# Patient Record
Sex: Male | Born: 1960 | Race: White | Hispanic: No | Smoking: Former smoker
Health system: Southern US, Community
[De-identification: ages and names within clinical notes are randomized; demographics above are authoritative.]

## PROBLEM LIST (undated history)

## (undated) DIAGNOSIS — Z21 Asymptomatic human immunodeficiency virus [HIV] infection status: Secondary | ICD-10-CM

## (undated) DIAGNOSIS — A630 Anogenital (venereal) warts: Secondary | ICD-10-CM

## (undated) DIAGNOSIS — B20 Human immunodeficiency virus [HIV] disease: Secondary | ICD-10-CM

## (undated) DIAGNOSIS — C210 Malignant neoplasm of anus, unspecified: Principal | ICD-10-CM

## (undated) DIAGNOSIS — Z923 Personal history of irradiation: Secondary | ICD-10-CM

## (undated) DIAGNOSIS — J189 Pneumonia, unspecified organism: Secondary | ICD-10-CM

## (undated) DIAGNOSIS — IMO0002 Reserved for concepts with insufficient information to code with codable children: Secondary | ICD-10-CM

## (undated) DIAGNOSIS — T7840XA Allergy, unspecified, initial encounter: Secondary | ICD-10-CM

## (undated) DIAGNOSIS — C801 Malignant (primary) neoplasm, unspecified: Secondary | ICD-10-CM

## (undated) HISTORY — PX: COLONOSCOPY: SHX174

## (undated) HISTORY — DX: Allergy, unspecified, initial encounter: T78.40XA

## (undated) HISTORY — PX: NO PAST SURGERIES: SHX2092

## (undated) HISTORY — DX: Malignant neoplasm of anus, unspecified: C21.0

## (undated) HISTORY — DX: Anogenital (venereal) warts: A63.0

## (undated) HISTORY — DX: Malignant (primary) neoplasm, unspecified: C80.1

## (undated) HISTORY — DX: Asymptomatic human immunodeficiency virus (hiv) infection status: Z21

## (undated) HISTORY — DX: Human immunodeficiency virus (HIV) disease: B20

## (undated) HISTORY — DX: Personal history of irradiation: Z92.3

## (undated) HISTORY — DX: Reserved for concepts with insufficient information to code with codable children: IMO0002

---

## 1999-05-07 ENCOUNTER — Encounter: Payer: Self-pay | Admitting: Internal Medicine

## 1999-10-08 ENCOUNTER — Encounter: Admission: RE | Admit: 1999-10-08 | Discharge: 1999-10-08 | Payer: Self-pay | Admitting: Internal Medicine

## 1999-10-08 ENCOUNTER — Ambulatory Visit (HOSPITAL_COMMUNITY): Admission: RE | Admit: 1999-10-08 | Discharge: 1999-10-08 | Payer: Self-pay | Admitting: Internal Medicine

## 1999-10-08 ENCOUNTER — Encounter (INDEPENDENT_AMBULATORY_CARE_PROVIDER_SITE_OTHER): Payer: Self-pay | Admitting: *Deleted

## 1999-10-08 LAB — CONVERTED CEMR LAB: CD4 Count: 50 microliters

## 2000-02-10 ENCOUNTER — Ambulatory Visit (HOSPITAL_COMMUNITY): Admission: RE | Admit: 2000-02-10 | Discharge: 2000-02-10 | Payer: Self-pay | Admitting: Internal Medicine

## 2000-02-10 ENCOUNTER — Encounter: Admission: RE | Admit: 2000-02-10 | Discharge: 2000-02-10 | Payer: Self-pay | Admitting: Internal Medicine

## 2000-02-24 ENCOUNTER — Encounter: Admission: RE | Admit: 2000-02-24 | Discharge: 2000-02-24 | Payer: Self-pay | Admitting: Internal Medicine

## 2000-02-28 ENCOUNTER — Encounter: Payer: Self-pay | Admitting: Internal Medicine

## 2000-03-15 ENCOUNTER — Encounter: Admission: RE | Admit: 2000-03-15 | Discharge: 2000-03-15 | Payer: Self-pay | Admitting: Internal Medicine

## 2000-04-05 ENCOUNTER — Encounter: Admission: RE | Admit: 2000-04-05 | Discharge: 2000-04-05 | Payer: Self-pay | Admitting: Internal Medicine

## 2000-04-19 ENCOUNTER — Encounter: Admission: RE | Admit: 2000-04-19 | Discharge: 2000-04-19 | Payer: Self-pay | Admitting: Internal Medicine

## 2000-05-17 ENCOUNTER — Encounter: Admission: RE | Admit: 2000-05-17 | Discharge: 2000-05-17 | Payer: Self-pay | Admitting: Internal Medicine

## 2000-10-17 ENCOUNTER — Ambulatory Visit (HOSPITAL_COMMUNITY): Admission: RE | Admit: 2000-10-17 | Discharge: 2000-10-17 | Payer: Self-pay | Admitting: Internal Medicine

## 2000-10-17 ENCOUNTER — Encounter: Admission: RE | Admit: 2000-10-17 | Discharge: 2000-10-17 | Payer: Self-pay | Admitting: Internal Medicine

## 2001-12-12 ENCOUNTER — Ambulatory Visit (HOSPITAL_COMMUNITY): Admission: RE | Admit: 2001-12-12 | Discharge: 2001-12-12 | Payer: Self-pay | Admitting: Internal Medicine

## 2001-12-12 ENCOUNTER — Encounter: Admission: RE | Admit: 2001-12-12 | Discharge: 2001-12-12 | Payer: Self-pay | Admitting: Internal Medicine

## 2002-09-04 ENCOUNTER — Encounter: Payer: Self-pay | Admitting: Internal Medicine

## 2002-09-04 ENCOUNTER — Encounter: Admission: RE | Admit: 2002-09-04 | Discharge: 2002-09-04 | Payer: Self-pay | Admitting: Internal Medicine

## 2002-09-04 ENCOUNTER — Ambulatory Visit (HOSPITAL_COMMUNITY): Admission: RE | Admit: 2002-09-04 | Discharge: 2002-09-04 | Payer: Self-pay | Admitting: Internal Medicine

## 2002-09-18 ENCOUNTER — Encounter: Admission: RE | Admit: 2002-09-18 | Discharge: 2002-09-18 | Payer: Self-pay | Admitting: Internal Medicine

## 2002-10-18 ENCOUNTER — Encounter: Admission: RE | Admit: 2002-10-18 | Discharge: 2002-10-18 | Payer: Self-pay | Admitting: Internal Medicine

## 2002-11-27 ENCOUNTER — Encounter: Admission: RE | Admit: 2002-11-27 | Discharge: 2002-11-27 | Payer: Self-pay | Admitting: Internal Medicine

## 2002-11-27 ENCOUNTER — Encounter: Payer: Self-pay | Admitting: Internal Medicine

## 2002-11-27 ENCOUNTER — Ambulatory Visit (HOSPITAL_COMMUNITY): Admission: RE | Admit: 2002-11-27 | Discharge: 2002-11-27 | Payer: Self-pay | Admitting: Internal Medicine

## 2004-02-05 ENCOUNTER — Ambulatory Visit (HOSPITAL_COMMUNITY): Admission: RE | Admit: 2004-02-05 | Discharge: 2004-02-05 | Payer: Self-pay | Admitting: Internal Medicine

## 2004-02-05 ENCOUNTER — Ambulatory Visit: Payer: Self-pay | Admitting: Internal Medicine

## 2004-02-18 ENCOUNTER — Ambulatory Visit: Payer: Self-pay | Admitting: Internal Medicine

## 2004-06-17 ENCOUNTER — Ambulatory Visit: Payer: Self-pay | Admitting: Internal Medicine

## 2004-06-17 ENCOUNTER — Ambulatory Visit (HOSPITAL_COMMUNITY): Admission: RE | Admit: 2004-06-17 | Discharge: 2004-06-17 | Payer: Self-pay | Admitting: Internal Medicine

## 2004-07-07 ENCOUNTER — Ambulatory Visit: Payer: Self-pay | Admitting: Internal Medicine

## 2005-01-06 ENCOUNTER — Ambulatory Visit: Payer: Self-pay | Admitting: Internal Medicine

## 2005-01-06 ENCOUNTER — Encounter (INDEPENDENT_AMBULATORY_CARE_PROVIDER_SITE_OTHER): Payer: Self-pay | Admitting: *Deleted

## 2005-01-06 ENCOUNTER — Ambulatory Visit (HOSPITAL_COMMUNITY): Admission: RE | Admit: 2005-01-06 | Discharge: 2005-01-06 | Payer: Self-pay | Admitting: Internal Medicine

## 2005-01-06 LAB — CONVERTED CEMR LAB: CD4 Count: 20 microliters

## 2005-09-21 ENCOUNTER — Encounter: Admission: RE | Admit: 2005-09-21 | Discharge: 2005-09-21 | Payer: Self-pay | Admitting: Internal Medicine

## 2005-09-21 ENCOUNTER — Encounter (INDEPENDENT_AMBULATORY_CARE_PROVIDER_SITE_OTHER): Payer: Self-pay | Admitting: *Deleted

## 2005-09-21 ENCOUNTER — Ambulatory Visit: Payer: Self-pay | Admitting: Internal Medicine

## 2005-09-21 LAB — CONVERTED CEMR LAB: CD4 Count: 9 microliters

## 2005-10-05 ENCOUNTER — Ambulatory Visit: Payer: Self-pay | Admitting: Internal Medicine

## 2005-10-05 ENCOUNTER — Encounter (INDEPENDENT_AMBULATORY_CARE_PROVIDER_SITE_OTHER): Payer: Self-pay | Admitting: *Deleted

## 2005-10-05 LAB — CONVERTED CEMR LAB: HIV 1 RNA Quant: 2500000 copies/mL

## 2005-10-18 ENCOUNTER — Encounter: Payer: Self-pay | Admitting: Internal Medicine

## 2005-10-26 ENCOUNTER — Ambulatory Visit: Payer: Self-pay | Admitting: Internal Medicine

## 2005-12-09 ENCOUNTER — Ambulatory Visit: Payer: Self-pay | Admitting: Internal Medicine

## 2005-12-09 ENCOUNTER — Encounter: Admission: RE | Admit: 2005-12-09 | Discharge: 2005-12-09 | Payer: Self-pay | Admitting: Internal Medicine

## 2005-12-09 ENCOUNTER — Encounter (INDEPENDENT_AMBULATORY_CARE_PROVIDER_SITE_OTHER): Payer: Self-pay | Admitting: *Deleted

## 2005-12-09 LAB — CONVERTED CEMR LAB
ALT: 33 units/L (ref 0–53)
Alkaline Phosphatase: 57 units/L (ref 39–117)
BUN: 15 mg/dL (ref 6–23)
CD4 Count: 10 microliters
Calcium: 8.8 mg/dL (ref 8.4–10.5)
Chloride: 104 meq/L (ref 96–112)
Creatinine, Ser: 0.94 mg/dL (ref 0.40–1.50)
Eosinophils Absolute: 0 cells/mcL (ref 0.0–0.3)
Eosinophils Relative: 1 % (ref 0–4)
Glucose, Bld: 115 mg/dL — ABNORMAL HIGH (ref 70–99)
HCT: 37.2 % — ABNORMAL LOW (ref 41.0–49.0)
Hemoglobin: 12.7 g/dL — ABNORMAL LOW (ref 13.9–16.8)
Leukocyte count, blood: 3.5 10*9/L — ABNORMAL LOW (ref 3.7–10.0)
Lymphocytes Relative: 51 % — ABNORMAL HIGH (ref 15–43)
MCV: 114.1 fL — ABNORMAL HIGH (ref 78.8–100.0)
Monocytes Absolute: 0.5 10*3/uL (ref 0.2–0.7)
Potassium: 4.2 meq/L (ref 3.5–5.3)
RBC: 3.26 M/uL — ABNORMAL LOW (ref 4.20–5.50)

## 2005-12-28 ENCOUNTER — Ambulatory Visit: Payer: Self-pay | Admitting: Internal Medicine

## 2006-01-26 ENCOUNTER — Emergency Department (HOSPITAL_COMMUNITY): Admission: EM | Admit: 2006-01-26 | Discharge: 2006-01-26 | Payer: Self-pay | Admitting: Emergency Medicine

## 2006-03-28 ENCOUNTER — Encounter (INDEPENDENT_AMBULATORY_CARE_PROVIDER_SITE_OTHER): Payer: Self-pay | Admitting: *Deleted

## 2006-03-28 LAB — CONVERTED CEMR LAB

## 2006-04-10 ENCOUNTER — Encounter (INDEPENDENT_AMBULATORY_CARE_PROVIDER_SITE_OTHER): Payer: Self-pay | Admitting: *Deleted

## 2006-05-30 ENCOUNTER — Telehealth: Payer: Self-pay | Admitting: Internal Medicine

## 2007-03-16 ENCOUNTER — Telehealth: Payer: Self-pay | Admitting: Internal Medicine

## 2007-05-25 ENCOUNTER — Telehealth: Payer: Self-pay | Admitting: Internal Medicine

## 2007-05-30 ENCOUNTER — Encounter: Payer: Self-pay | Admitting: Internal Medicine

## 2007-05-30 ENCOUNTER — Encounter (INDEPENDENT_AMBULATORY_CARE_PROVIDER_SITE_OTHER): Payer: Self-pay | Admitting: *Deleted

## 2007-05-30 DIAGNOSIS — B20 Human immunodeficiency virus [HIV] disease: Secondary | ICD-10-CM

## 2007-07-10 ENCOUNTER — Encounter: Admission: RE | Admit: 2007-07-10 | Discharge: 2007-07-10 | Payer: Self-pay | Admitting: Internal Medicine

## 2007-07-10 ENCOUNTER — Ambulatory Visit: Payer: Self-pay | Admitting: Internal Medicine

## 2007-07-10 LAB — CONVERTED CEMR LAB
ALT: 39 units/L (ref 0–53)
AST: 28 units/L (ref 0–37)
Albumin: 4.5 g/dL (ref 3.5–5.2)
Basophils Relative: 0 % (ref 0–1)
CO2: 25 meq/L (ref 19–32)
Calcium: 9.6 mg/dL (ref 8.4–10.5)
Chlamydia, Swab/Urine, PCR: NEGATIVE
Chloride: 105 meq/L (ref 96–112)
Eosinophils Absolute: 0 10*3/uL (ref 0.0–0.7)
Eosinophils Relative: 0 % (ref 0–5)
GC Probe Amp, Urine: NEGATIVE
HIV 1 RNA Quant: 22300 copies/mL — ABNORMAL HIGH (ref <50)
Ketones, ur: NEGATIVE mg/dL
LDL Cholesterol: 89 mg/dL (ref 0–99)
Lymphocytes Relative: 22 % (ref 12–46)
Lymphs Abs: 0.9 10*3/uL (ref 0.7–4.0)
MCHC: 34 g/dL (ref 30.0–36.0)
MCV: 103.6 fL — ABNORMAL HIGH (ref 78.0–100.0)
Neutro Abs: 2.6 10*3/uL (ref 1.7–7.7)
Potassium: 4.7 meq/L (ref 3.5–5.3)
Protein, ur: NEGATIVE mg/dL
RBC: 3.86 M/uL — ABNORMAL LOW (ref 4.22–5.81)
Sodium: 141 meq/L (ref 135–145)
Total Bilirubin: 0.8 mg/dL (ref 0.3–1.2)
Urine Glucose: NEGATIVE mg/dL
Urobilinogen, UA: 0.2 (ref 0.0–1.0)
VLDL: 13 mg/dL (ref 0–40)
pH: 6.5 (ref 5.0–8.0)

## 2007-07-25 ENCOUNTER — Ambulatory Visit: Payer: Self-pay | Admitting: Internal Medicine

## 2007-07-31 ENCOUNTER — Encounter: Payer: Self-pay | Admitting: Internal Medicine

## 2007-07-31 ENCOUNTER — Telehealth: Payer: Self-pay | Admitting: Internal Medicine

## 2007-10-05 ENCOUNTER — Ambulatory Visit: Payer: Self-pay | Admitting: Internal Medicine

## 2007-10-05 DIAGNOSIS — IMO0002 Reserved for concepts with insufficient information to code with codable children: Secondary | ICD-10-CM

## 2007-10-05 DIAGNOSIS — M5416 Radiculopathy, lumbar region: Secondary | ICD-10-CM | POA: Insufficient documentation

## 2007-10-16 ENCOUNTER — Telehealth: Payer: Self-pay | Admitting: Internal Medicine

## 2007-10-25 ENCOUNTER — Encounter: Payer: Self-pay | Admitting: Internal Medicine

## 2007-10-25 ENCOUNTER — Ambulatory Visit: Payer: Self-pay | Admitting: Internal Medicine

## 2007-10-26 ENCOUNTER — Ambulatory Visit (HOSPITAL_COMMUNITY): Admission: RE | Admit: 2007-10-26 | Discharge: 2007-10-26 | Payer: Self-pay | Admitting: Internal Medicine

## 2007-10-30 ENCOUNTER — Telehealth (INDEPENDENT_AMBULATORY_CARE_PROVIDER_SITE_OTHER): Payer: Self-pay | Admitting: *Deleted

## 2007-10-31 ENCOUNTER — Encounter: Admission: RE | Admit: 2007-10-31 | Discharge: 2008-01-03 | Payer: Self-pay | Admitting: Internal Medicine

## 2007-11-03 ENCOUNTER — Encounter: Payer: Self-pay | Admitting: Internal Medicine

## 2007-11-29 ENCOUNTER — Encounter: Payer: Self-pay | Admitting: Internal Medicine

## 2007-12-06 ENCOUNTER — Ambulatory Visit: Payer: Self-pay | Admitting: Internal Medicine

## 2007-12-06 LAB — CONVERTED CEMR LAB
Alkaline Phosphatase: 81 units/L (ref 39–117)
BUN: 20 mg/dL (ref 6–23)
Basophils Absolute: 0 10*3/uL (ref 0.0–0.1)
CO2: 26 meq/L (ref 19–32)
Chloride: 106 meq/L (ref 96–112)
Glucose, Bld: 95 mg/dL (ref 70–99)
HIV 1 RNA Quant: >1000000 copies/mL — ABNORMAL HIGH (ref <48)
Lymphocytes Relative: 23 % (ref 12–46)
Lymphs Abs: 0.5 10*3/uL — ABNORMAL LOW (ref 0.7–4.0)
MCV: 104.3 fL — ABNORMAL HIGH (ref 78.0–100.0)
Monocytes Absolute: 0.6 10*3/uL (ref 0.1–1.0)
Neutrophils Relative %: 48 % (ref 43–77)
Platelets: 101 10*3/uL — ABNORMAL LOW (ref 150–400)
RDW: 15.2 % (ref 11.5–15.5)
Sodium: 141 meq/L (ref 135–145)
Total Bilirubin: 0.6 mg/dL (ref 0.3–1.2)

## 2007-12-26 ENCOUNTER — Ambulatory Visit: Payer: Self-pay | Admitting: Internal Medicine

## 2007-12-26 DIAGNOSIS — Z8739 Personal history of other diseases of the musculoskeletal system and connective tissue: Secondary | ICD-10-CM | POA: Insufficient documentation

## 2007-12-26 DIAGNOSIS — IMO0002 Reserved for concepts with insufficient information to code with codable children: Secondary | ICD-10-CM

## 2008-02-06 ENCOUNTER — Ambulatory Visit: Payer: Self-pay | Admitting: Internal Medicine

## 2008-02-06 LAB — CONVERTED CEMR LAB
Albumin: 4.2 g/dL (ref 3.5–5.2)
BUN: 18 mg/dL (ref 6–23)
Basophils Absolute: 0 10*3/uL (ref 0.0–0.1)
Chloride: 105 meq/L (ref 96–112)
Cholesterol: 167 mg/dL (ref 0–200)
Eosinophils Relative: 1 % (ref 0–5)
Glucose, Bld: 99 mg/dL (ref 70–99)
HIV 1 RNA Quant: 192 copies/mL — ABNORMAL HIGH (ref <48)
HIV-1 RNA Quant, Log: 2.28 — ABNORMAL HIGH (ref <1.68)
Hemoglobin: 12.2 g/dL — ABNORMAL LOW (ref 13.0–17.0)
LDL Cholesterol: 105 mg/dL — ABNORMAL HIGH (ref 0–99)
Lymphs Abs: 2.2 10*3/uL (ref 0.7–4.0)
MCV: 115.2 fL — ABNORMAL HIGH (ref 78.0–100.0)
Monocytes Absolute: 0.7 10*3/uL (ref 0.1–1.0)
Neutrophils Relative %: 31 % — ABNORMAL LOW (ref 43–77)
Platelets: 217 10*3/uL (ref 150–400)
Potassium: 4.7 meq/L (ref 3.5–5.3)
RBC: 3.02 M/uL — ABNORMAL LOW (ref 4.22–5.81)
Triglycerides: 73 mg/dL (ref <150)
VLDL: 15 mg/dL (ref 0–40)
WBC: 4.3 10*3/uL (ref 4.0–10.5)

## 2008-03-05 ENCOUNTER — Ambulatory Visit: Payer: Self-pay | Admitting: Internal Medicine

## 2008-09-25 ENCOUNTER — Ambulatory Visit: Payer: Self-pay | Admitting: Internal Medicine

## 2008-09-25 LAB — CONVERTED CEMR LAB
HIV 1 RNA Quant: 170 copies/mL — ABNORMAL HIGH (ref <48)
HIV-1 RNA Quant, Log: 2.23 — ABNORMAL HIGH (ref <1.68)

## 2008-10-22 ENCOUNTER — Ambulatory Visit: Payer: Self-pay | Admitting: Internal Medicine

## 2008-10-22 DIAGNOSIS — K8010 Calculus of gallbladder with chronic cholecystitis without obstruction: Secondary | ICD-10-CM

## 2009-03-28 ENCOUNTER — Encounter: Payer: Self-pay | Admitting: Internal Medicine

## 2009-03-31 ENCOUNTER — Encounter (INDEPENDENT_AMBULATORY_CARE_PROVIDER_SITE_OTHER): Payer: Self-pay | Admitting: *Deleted

## 2009-09-19 ENCOUNTER — Encounter (INDEPENDENT_AMBULATORY_CARE_PROVIDER_SITE_OTHER): Payer: Self-pay | Admitting: *Deleted

## 2009-10-07 ENCOUNTER — Ambulatory Visit: Payer: Self-pay | Admitting: Internal Medicine

## 2009-10-07 LAB — CONVERTED CEMR LAB: HIV 1 RNA Quant: 130000 copies/mL — ABNORMAL HIGH (ref <48)

## 2009-10-14 ENCOUNTER — Encounter: Payer: Self-pay | Admitting: Internal Medicine

## 2009-10-21 ENCOUNTER — Ambulatory Visit: Payer: Self-pay | Admitting: Internal Medicine

## 2009-11-06 ENCOUNTER — Telehealth (INDEPENDENT_AMBULATORY_CARE_PROVIDER_SITE_OTHER): Payer: Self-pay | Admitting: *Deleted

## 2009-11-07 ENCOUNTER — Encounter: Payer: Self-pay | Admitting: Internal Medicine

## 2009-11-14 ENCOUNTER — Encounter: Payer: Self-pay | Admitting: Internal Medicine

## 2009-11-27 ENCOUNTER — Ambulatory Visit: Payer: Self-pay | Admitting: Internal Medicine

## 2009-11-27 DIAGNOSIS — Z87891 Personal history of nicotine dependence: Secondary | ICD-10-CM | POA: Insufficient documentation

## 2009-11-27 DIAGNOSIS — J209 Acute bronchitis, unspecified: Secondary | ICD-10-CM | POA: Insufficient documentation

## 2009-12-03 ENCOUNTER — Encounter: Payer: Self-pay | Admitting: Internal Medicine

## 2009-12-16 ENCOUNTER — Encounter (INDEPENDENT_AMBULATORY_CARE_PROVIDER_SITE_OTHER): Payer: Self-pay | Admitting: *Deleted

## 2010-01-12 ENCOUNTER — Ambulatory Visit: Payer: Self-pay | Admitting: Internal Medicine

## 2010-01-12 ENCOUNTER — Encounter: Payer: Self-pay | Admitting: Internal Medicine

## 2010-01-12 LAB — CONVERTED CEMR LAB
ALT: 28 units/L (ref 0–53)
AST: 23 units/L (ref 0–37)
Albumin: 4.5 g/dL (ref 3.5–5.2)
Calcium: 9.2 mg/dL (ref 8.4–10.5)
Eosinophils Relative: 2 % (ref 0–5)
HCT: 38.8 % — ABNORMAL LOW (ref 39.0–52.0)
HIV-1 RNA Quant, Log: 2.66 — ABNORMAL HIGH (ref <1.30)
MCV: 102.6 fL — ABNORMAL HIGH (ref 78.0–100.0)
Monocytes Relative: 13 % — ABNORMAL HIGH (ref 3–12)
Neutro Abs: 1.5 10*3/uL — ABNORMAL LOW (ref 1.7–7.7)
Neutrophils Relative %: 39 % — ABNORMAL LOW (ref 43–77)
Platelets: 192 10*3/uL (ref 150–400)
RBC: 3.78 M/uL — ABNORMAL LOW (ref 4.22–5.81)
Sodium: 140 meq/L (ref 135–145)
Total Protein: 7.3 g/dL (ref 6.0–8.3)
WBC: 3.9 10*3/uL — ABNORMAL LOW (ref 4.0–10.5)

## 2010-02-03 ENCOUNTER — Ambulatory Visit
Admission: RE | Admit: 2010-02-03 | Discharge: 2010-02-03 | Payer: Self-pay | Source: Home / Self Care | Attending: Internal Medicine | Admitting: Internal Medicine

## 2010-03-03 NOTE — Miscellaneous (Signed)
Summary: RW Update  Clinical Lists Changes  Observations: Added new observation of PAYOR: Private (11/07/2009 10:57)

## 2010-03-03 NOTE — Miscellaneous (Signed)
Summary: clinical update/ryan white ncadap app completed  Clinical Lists Changes  Observations: Added new observation of AIDSDAP: Pending (09/19/2009 11:06) Added new observation of RWTITLE: B (09/19/2009 11:06) Added new observation of PAYOR: No Insurance (09/19/2009 11:06) Added new observation of PCTFPL: 227.45  (09/19/2009 11:06) Added new observation of INCOMESOURCE: unemployment  (09/19/2009 11:06) Added new observation of HOUSEINCOME: 81191  (09/19/2009 11:06) Added new observation of FINASSESSDT: 09/19/2009  (09/19/2009 11:06) Added new observation of YEARLYEXPEN: 2150  (09/19/2009 11:06) Added new observation of RW VITAL STA: Active  (09/19/2009 11:06)     Paulo Fruit  BS,CPht II,MPH  September 19, 2009 11:07 AM

## 2010-03-03 NOTE — Assessment & Plan Note (Signed)
Summary: F/U [MKJ]   CC:  follow-up visit.  History of Present Illness:   Matthew Cortez is in for his routine appointment. He was able to afford dapsone, fluconazole and azithromycin and has been taking all 3 since his last visit.  He tells me that his APAP was recently approved.  He still is looking for work.  He recently developed a moderately productive cough.  His partner has been sick with a similar illness.  He has not had any fever or shortness of breath.  He continues to smoke 4 to 5 cigarettes daily but does want to quit.  Preventive Screening-Counseling & Management  Alcohol-Tobacco     Alcohol type: wine, 4 times a week     Smoking Status: current     Smoking Cessation Counseling: yes     Packs/Day: 4 cigs     Passive Smoke Exposure: no  Caffeine-Diet-Exercise     Caffeine use/day: 4     Does Patient Exercise: yes     Type of exercise: dog walking, twice a day     Times/week: 7  Hep-HIV-STD-Contraception     HIV Risk: no risk noted     HIV Risk Counseling: not indicated-no HIV risk noted  Safety-Violence-Falls     Seat Belt Use: yes  Comments: declined condoms      Sexual History:  currently monogamous.        Drug Use:  never.     Prior Medication List:  DAPSONE 100 MG TABS (DAPSONE) Take 1 tablet by mouth once a day FLUCONAZOLE 100 MG TABS (FLUCONAZOLE) Take 1 tablet by mouth once a week AZITHROMYCIN 600 MG TABS (AZITHROMYCIN) Take 2 tablets by mouth once a week   Current Allergies (reviewed today): ! SULFA ! [facility] ! AMOXICILLIN (AMOXICILLIN) Vital Signs:  Patient profile:   50 year old male Height:      73 inches (185.42 cm) Weight:      218.5 pounds (99.32 kg) BMI:     28.93 Temp:     98.1 degrees F (36.72 degrees C) oral Pulse rate:   61 / minute BP sitting:   127 / 85  (left arm) Cuff size:   large  Vitals Entered By: Jennet Maduro RN (November 27, 2009 9:23 AM) CC: follow-up visit Is Patient Diabetic? No Pain Assessment Patient in  pain? no      Nutritional Status BMI of 25 - 29 = overweight Nutritional Status Detail appetite "good"  Have you ever been in a relationship where you felt threatened, hurt or afraid?No   Does patient need assistance? Functional Status Self care Ambulation Normal Comments dapsone, azithromycin and fluconazole now   Physical Exam  General:  alert and well-nourished.  alert and well-nourished.   Mouth:  good dentition, pharynx pink and moist, no erythema, and no exudates.   Lungs:  normal breath sounds, no crackles, and no wheezes.   Heart:  normal rate, regular rhythm, and no murmur.   Skin:  no rashes.  no rashes.   Axillary Nodes:  no R axillary adenopathy and no L axillary adenopathy.  no R axillary adenopathy and no L axillary adenopathy.   Psych:  normally interactive, good eye contact, not anxious appearing, and not depressed appearing.  normally interactive, good eye contact, not anxious appearing, and not depressed appearing.          Medication Adherence: 11/27/2009   Adherence to medications reviewed with patient. Counseling to provide adequate adherence provided  Impression & Recommendations:  Problem # 1:  HIV INFECTION (ICD-042) Savion is ready to restart anti-retroviral therapy.  I reviewed his multiple past regimens.  He has been on Combivir, Viread,  Videx, Trizivir, Zerit, Viramune, Sustiva, Kaletra, Prezista and Norvir. 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  He has also had an intolerance with many regimens usually with nausea poor sleepiness.  He has had 5 prior genotype resistance tests. 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  I have recommended that he start Truvada, Retrovir, Reyataz and Norvir. His updated medication list for this problem includes:    Fluconazole 100 Mg Tabs (Fluconazole) .Marland Kitchen... Take 1 tablet by mouth once a week    Azithromycin 600 Mg  Tabs (Azithromycin) .Marland Kitchen... 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 Patient Level IV (99214)Future Orders: T-CD4SP ([facility]) (CD4SP) ... 01/08/2010 T-HIV Viral Load [id number]) ... 01/08/2010 T-Comprehensive Metabolic Panel [id number]) ... 01/08/2010 T-CBC w/Diff ([id number]) ... 01/08/2010 T-RPR (Syphilis) [phone]) ... 01/08/2010  Problem # 2:  BRONCHITIS, ACUTE (ICD-466.0) He has acute bronchitis related to his cigarette use and probably an upper respiratory infection.  I had a discussion with him about the importance of quitting smoking.  I've given him written information about local resources for cigarette cessation counseling. His updated medication list for this problem includes:    Azithromycin 600 Mg Tabs (Azithromycin) .Marland Kitchen... Take 2 tablets by mouth once a week  Orders: Est. Patient Level IV (34742)  Medications Added to Medication List This Visit: 1)  Truvada 200-300 Mg Tabs (Emtricitabine-tenofovir) .... Take 1 tablet by mouth once a day 2)  Retrovir 300 Mg Tabs (Zidovudine) .... Take 1 tablet by mouth twice a day 3)  Reyataz 300 Mg Caps (Atazanavir sulfate) .... Take 1 capsule by mouth once a day 4)  Norvir 100 Mg Tabs (Ritonavir) .... Take 1 tablet by mouth once a day  Patient Instructions: 1)  Please schedule a follow-up appointment in 2 months.

## 2010-03-03 NOTE — Assessment & Plan Note (Signed)
Summary: F/U OV/VS   CC:  follow-up visit, lower back still bothering him, and left lower leg still numb.  History of Present Illness:  Matthew Cortez is in for his first visit in one year.  Unfortunately, he lost his job again in February and therefore lost his health insurance.  He has been off all of his medications since early March.  He is still looking for work.  His partner of 11 years also lost his job and both are getting small unemployment checks.  They do use condoms.  Fortunately, he is feeling well and has actually gained weight since he is no longer working.  Preventive Screening-Counseling & Management  Alcohol-Tobacco     Alcohol type: wine, 4 times a week     Smoking Status: current     Smoking Cessation Counseling: yes     Packs/Day: 4 cigs     Passive Smoke Exposure: no  Caffeine-Diet-Exercise     Caffeine use/day: 4     Does Patient Exercise: yes     Type of exercise: dog walking, twice a day     Times/week: 7  Hep-HIV-STD-Contraception     HIV Risk: no risk noted     HIV Risk Counseling: not indicated-no HIV risk noted  Safety-Violence-Falls     Seat Belt Use: yes  Comments: declined condoms      Sexual History:  currently monogamous.        Drug Use:  never.     Updated Prior Medication List: No Medications Current Allergies (reviewed today): ! SULFA ! [facility] ! AMOXICILLIN (AMOXICILLIN) Social History: Sexual History:  currently monogamous Drug Use:  never  Vital Signs:  Patient profile:   50 year old male Height:      73 inches (185.42 cm) Weight:      219 pounds (99.55 kg) BMI:     29.00 Temp:     98.0 degrees F (36.67 degrees C) oral Pulse rate:   63 / minute BP sitting:   125 / 80  (left arm) Cuff size:   large  Vitals Entered By: Jennet Maduro RN (October 21, 2009 9:07 AM) CC: follow-up visit, lower back still bothering him, left lower leg still numb Is Patient Diabetic? No Pain Assessment Patient in pain? no       Nutritional Status BMI of 25 - 29 = overweight Nutritional Status Detail appetite  "good"  Have you ever been in a relationship where you felt threatened, hurt or afraid?No   Does patient need assistance? Functional Status Self care Ambulation Normal Comments missed some doses of rxes due to being laid off in February., Did get with By[facility] Hesselbach to start ADAP.  Had to wait 6 months due to previous income.  Now on unemployment.  Brought in letter from [facility] about  medications.  Encouraged to take letter to [facility] today   Physical Exam  General:  alert and overweight-appearing.   Mouth:  good dentition, pharynx pink and moist, no erythema, and no exudates.   Lungs:  normal breath sounds, no crackles, and no wheezes.   Heart:  normal rate, regular rhythm, and no murmur.   Skin:  no rashes.   Psych:  normally interactive, good eye contact, not anxious appearing, and not depressed appearing.          Medication Adherence: 10/21/2009   Adherence to medications reviewed with patient. Counseling to provide adequate adherence provided   Prevention For Positives: 10/21/2009   Safe sex practices discussed with patient. Condoms  offered.                             Impression & Recommendations:  Problem # 1:  HIV INFECTION (ICD-042) I will try to help Matthew Cortez restart his prophylactic medications while awaiting for ADAP approval.  His recent genotype showed the 184V mutation.  I will review his past regimens and other genotypes and then select a new anti-retroviral regimen. The following medications were removed from the medication list:    Fluconazole 100 Mg Tabs (Fluconazole) .Marland Kitchen... Take 1 tablet by mouth once a week His updated medication list for this problem includes:    Fluconazole 100 Mg Tabs (Fluconazole) .Marland Kitchen... Take 1 tablet by mouth once a week    Azithromycin 600 Mg Tabs (Azithromycin) .Marland Kitchen... Take 2 tablets by mouth once a week  Diagnostics Reviewed:  HIV: CDC-defined AIDS  (07/25/2007)   CD4: 30 (10/08/2009)   WBC: 4.3 (02/06/2008)   Hgb: 12.2 (02/06/2008)   HCT: 34.8 (02/06/2008)   Platelets: 217 (02/06/2008) HIV genotype: See Comment (10/14/2009)   HIV-1 RNA: 130000 (10/07/2009)   HBSAg: No (03/28/2006)  Orders: Est. Patient Level III (04540)  Medications Added to Medication List This Visit: 1)  Dapsone 100 Mg Tabs (Dapsone) .... Take 1 tablet by mouth once a day 2)  Fluconazole 100 Mg Tabs (Fluconazole) .... Take 1 tablet by mouth once a week 3)  Azithromycin 600 Mg Tabs (Azithromycin) .... Take 2 tablets by mouth once a week  Patient Instructions: 1)  Please schedule a follow-up appointment in 1 month.   Prescriptions: AZITHROMYCIN 600 MG TABS (AZITHROMYCIN) Take 2 tablets by mouth once a week  #10 x 11   Entered and Authorized by:   Cliffton Asters MD   Signed by:   Cliffton Asters MD on 10/21/2009   Method used:   Print then Give to Patient   RxID:   [id number] FLUCONAZOLE 100 MG TABS (FLUCONAZOLE) Take 1 tablet by mouth once a week  #5 x 11   Entered and Authorized by:   Cliffton Asters MD   Signed by:   Cliffton Asters MD on 10/21/2009   Method used:   Print then Give to Patient   RxID:   [id number] DAPSONE 100 MG TABS (DAPSONE) Take 1 tablet by mouth once a day  #30 x 11   Entered and Authorized by:   Cliffton Asters MD   Signed by:   Cliffton Asters MD on 10/21/2009   Method used:   Print then Give to Patient   RxID:   [id number]   Appended Document: F/U OV + flu vaccine   Influenza Vaccine    Vaccine Type: Fluvax Non-MCR    Site: left deltoid    Mfr: novartis    Dose: 0.5 ml    Route: IM    Given by: Jennet Maduro RN    Exp. Date: 05/03/2010    Lot #: [id number]  Flu Vaccine Consent Questions    Do you have a history of severe allergic reactions to this vaccine? no    Any prior history of allergic reactions to egg and/or gelatin? no    Do you have a sensitivity to the preservative Thimersol? no    Do you have a past  history of Guillan-Barre Syndrome? no    Do you currently have an acute febrile illness? no    Have you ever had a severe reaction to latex? no    Vaccine information given  and explained to patient? yes

## 2010-03-03 NOTE — Miscellaneous (Signed)
Summary: Flu Vac 03/28/09  Clinical Lists Changes  Observations: Added new observation of FLU VAX: Historical (03/28/2009 14:55)      Influenza Immunization History:    Influenza # 1:  Historical (03/28/2009)

## 2010-03-03 NOTE — Progress Notes (Signed)
Summary: [employer]   Imported By: Lester Bellview 06/06/2009 10:36:25  _____________________________________________________________________  External Attachment:    Type:   Image     Comment:   External Document

## 2010-03-03 NOTE — Miscellaneous (Signed)
Summary: Matthew Cortez : Notification Of Vacc  Matthew Cortez : Notification Of Vacc   Imported By: Florinda Marker 04/03/2009 16:44:47  _____________________________________________________________________  External Attachment:    Type:   Image     Comment:   External Document

## 2010-03-03 NOTE — Miscellaneous (Signed)
Summary: call Ronn Melena to verify elig  Clinical Lists Changes found app to [facility] for trizivir & reyataz dated 10/08/09. will call the company to verify elig &  how long it is good for.Golden Circle RN  November 14, 2009 8:07 AM

## 2010-03-03 NOTE — Miscellaneous (Signed)
  Clinical Lists Changes  Observations: Added new observation of YEARAIDSPOS: 2006  (12/16/2009 16:18)

## 2010-03-03 NOTE — Progress Notes (Signed)
Summary: Patient Assistance - Diflucan & Azithromycin  Phone Note Other Incoming   Summary of Call: Recieved patient's Diflucan and Azithromycin from patient assistance.  Left message for pateint to contact clinic on voicemail. Initial call taken by: Altamease Oiler,  November 06, 2009 8:23 AM

## 2010-03-03 NOTE — Assessment & Plan Note (Signed)
   Allergies: 1)  ! Sulfa 2)  ! * Viramune 3)  ! Amoxicillin (Amoxicillin)   Impression & Recommendations:  Problem # 1:  HIV INFECTION (ICD-042)  His updated medication list for this problem includes:    Fluconazole 100 Mg Tabs (Fluconazole) .Marland Kitchen... Take 1 tablet by mouth once a week  Orders: T-HIV Genotype ([id number])

## 2010-03-05 NOTE — Assessment & Plan Note (Signed)
Summary: F/U/VS   CC:  follow-up visit.  History of Present Illness: Matthew Cortez is in for his routine visit.  He started his new HIV regimen in late September.  He decided to stop drinking coffee and found that he tolerated the medicines much better than previous regimens.  He only has minimal nausea and no diarrhea.  He has not missed a single dose of his medications since starting them.  He is feeling much better.  He is still looking for work and trying to quit smoking cigarettes.  He is down to about 4 or each day.  He has been reading the information I gave him on cigarette cessation and finds it helpful.  He had a good holiday.  His son, daughter-in-law, in 59-year-old granddaughter visited for about 3 weeks.  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  This was the first time meeting his granddaughter.  Preventive Screening-Counseling & Management  Alcohol-Tobacco     Alcohol type: wine, 4 times a week     Smoking Status: current     Smoking Cessation Counseling: yes     Packs/Day: 4 cigs     Passive Smoke Exposure: no  Caffeine-Diet-Exercise     Caffeine use/day: 4     Does Patient Exercise: yes     Type of exercise: dog walking, twice a day     Times/week: 7  Hep-HIV-STD-Contraception     HIV Risk: no risk noted     HIV Risk Counseling: not indicated-no HIV risk noted  Safety-Violence-Falls     Seat Belt Use: yes  Comments: declined condoms      Sexual History:  currently monogamous.        Drug Use:  never.     Prior Medication List:  TRUVADA 200-300 MG TABS (EMTRICITABINE-TENOFOVIR) Take 1 tablet by mouth once a day RETROVIR 300 MG TABS (ZIDOVUDINE) Take 1 tablet by mouth twice a day REYATAZ 300 MG CAPS (ATAZANAVIR SULFATE) Take 1 capsule by mouth once a day NORVIR 100 MG TABS (RITONAVIR) Take 1 tablet by mouth once a day DAPSONE 100 MG TABS (DAPSONE) Take 1 tablet by mouth once a  day FLUCONAZOLE 100 MG TABS (FLUCONAZOLE) Take 1 tablet by mouth once a week AZITHROMYCIN 600 MG TABS (AZITHROMYCIN) Take 2 tablets by mouth once a week   Current Allergies (reviewed today): ! SULFA ! [facility] ! AMOXICILLIN (AMOXICILLIN) Vital Signs:  Patient profile:   50 year old male Height:      73 inches (185.42 cm) Weight:      219.5 pounds (99.77 kg) BMI:     29.06 Temp:     97.6 degrees F (36.44 degrees C) oral Pulse rate:   62 / minute BP sitting:   135 / 85  (left arm) Cuff size:   large  Vitals Entered By: Jennet Maduro RN (February 03, 2010 9:58 AM) CC: follow-up visit Is Patient Diabetic? No Pain Assessment Patient in pain? no      Nutritional Status BMI of > 30 = obese Nutritional Status Detail apeetite "real good"  Have you ever been in a relationship where you felt threatened, hurt or afraid?No   Does patient need assistance? Functional Status Self care Ambulation Normal Comments no missed doses of rxes   Physical Exam  General:  alert and well-nourished.   Mouth:  good dentition, pharynx pink and moist, no erythema, and  no exudates.   Lungs:  normal breath sounds, no crackles, and no wheezes.   Heart:  normal rate, regular rhythm, and no murmur.   Skin:  no rashes.   Psych:  normally interactive, good eye contact, not anxious appearing, and not depressed appearing.          Medication Adherence: 02/03/2010   Adherence to medications reviewed with patient. Counseling to provide adequate adherence provided                                Impression & Recommendations:  Problem # 1:  HIV INFECTION (ICD-042) Matthew Cortez is off to an excellent start with his salvage regimen.  I will not make any changes today. His updated medication list for this problem includes:    Fluconazole 100 Mg Tabs (Fluconazole) .Marland Kitchen... Take 1 tablet by mouth once a week    Azithromycin 600 Mg Tabs (Azithromycin) .Marland Kitchen... Take 2 tablets by mouth once a  week  Diagnostics Reviewed:  HIV: CDC-defined AIDS (07/25/2007)   CD4: 20 (01/13/2010)   WBC: 3.9 (01/12/2010)   Hgb: 13.5 (01/12/2010)   HCT: 38.8 (01/12/2010)   Platelets: 192 (01/12/2010) HIV genotype: See Comment (10/14/2009)   HIV-1 RNA: 456 (01/12/2010)   HBSAg: No (03/28/2006)  Orders: Est. Patient Level III (99213)Future Orders: T-CD4SP ([facility]) (CD4SP) ... 05/04/2010 T-HIV Viral Load [id number]) ... 05/04/2010 T-Comprehensive Metabolic Panel [phone]) ... 05/04/2010 T-CBC w/Diff ([id number]) ... 05/04/2010 T-RPR (Syphilis) [phone]) ... 05/04/2010 T-Lipid Profile [phone]) ... 05/04/2010  Patient Instructions: 1)  Please schedule a follow-up appointment in 3 months.

## 2010-03-23 ENCOUNTER — Encounter (INDEPENDENT_AMBULATORY_CARE_PROVIDER_SITE_OTHER): Payer: Self-pay | Admitting: *Deleted

## 2010-03-31 NOTE — Miscellaneous (Signed)
Summary: ADAP Update  Clinical Lists Changes  Observations: Added new observation of PCTFPL: 224.86  (03/23/2010 11:04) Added new observation of HOUSEINCOME: 16109  (03/23/2010 11:04) Added new observation of PAYOR: No Insurance  (03/23/2010 11:04) Added new observation of AIDSDAP: Yes 2011-[place]  (03/23/2010 11:04)      patient on [facility]

## 2010-04-13 LAB — T-HELPER CELL (CD4) - (RCID CLINIC ONLY): CD4 T Cell Abs: 20 uL — ABNORMAL LOW (ref 400–2700)

## 2010-04-15 ENCOUNTER — Encounter (INDEPENDENT_AMBULATORY_CARE_PROVIDER_SITE_OTHER): Payer: Self-pay | Admitting: *Deleted

## 2010-04-16 LAB — T-HELPER CELL (CD4) - (RCID CLINIC ONLY)
CD4 % Helper T Cell: 2 % — ABNORMAL LOW (ref 33–55)
CD4 T Cell Abs: 30 uL — ABNORMAL LOW (ref 400–2700)

## 2010-04-21 NOTE — Miscellaneous (Signed)
  Clinical Lists Changes  Observations: Added new observation of AIDSDAP: PENDING 2012 APPROVAL (04/15/2010 9:28) Added new observation of FINASSESSDT: 04/15/2010 (04/15/2010 9:28) Added new observation of HOUSEINCOME: 95621  (04/15/2010 9:28)

## 2010-05-05 ENCOUNTER — Other Ambulatory Visit (INDEPENDENT_AMBULATORY_CARE_PROVIDER_SITE_OTHER): Payer: Self-pay

## 2010-05-05 DIAGNOSIS — B20 Human immunodeficiency virus [HIV] disease: Secondary | ICD-10-CM

## 2010-05-05 LAB — LIPID PANEL
Cholesterol: 139 mg/dL (ref 0–200)
LDL Cholesterol: 82 mg/dL (ref 0–99)
VLDL: 17 mg/dL (ref 0–40)

## 2010-05-05 LAB — CBC WITH DIFFERENTIAL/PLATELET
Basophils Absolute: 0 10*3/uL (ref 0.0–0.1)
Basophils Relative: 1 % (ref 0–1)
Eosinophils Absolute: 0 10*3/uL (ref 0.0–0.7)
Eosinophils Relative: 1 % (ref 0–5)
Hemoglobin: 13.3 g/dL (ref 13.0–17.0)
Lymphs Abs: 1.4 10*3/uL (ref 0.7–4.0)
MCHC: 36 g/dL (ref 30.0–36.0)
MCV: 119.4 fL — ABNORMAL HIGH (ref 78.0–100.0)
Neutro Abs: 2.2 10*3/uL (ref 1.7–7.7)
Neutrophils Relative %: 54 % (ref 43–77)
RBC: 3.09 MIL/uL — ABNORMAL LOW (ref 4.22–5.81)
RDW: 14 % (ref 11.5–15.5)

## 2010-05-06 LAB — COMPLETE METABOLIC PANEL WITH GFR
ALT: 16 U/L (ref 0–53)
AST: 20 U/L (ref 0–37)
Albumin: 4.4 g/dL (ref 3.5–5.2)
Alkaline Phosphatase: 76 U/L (ref 39–117)
BUN: 17 mg/dL (ref 6–23)
CO2: 24 mEq/L (ref 19–32)
Glucose, Bld: 97 mg/dL (ref 70–99)
Potassium: 4.5 mEq/L (ref 3.5–5.3)
Sodium: 139 mEq/L (ref 135–145)

## 2010-05-09 LAB — T-HELPER CELL (CD4) - (RCID CLINIC ONLY): CD4 % Helper T Cell: 2 % — ABNORMAL LOW (ref 33–55)

## 2010-05-18 LAB — T-HELPER CELL (CD4) - (RCID CLINIC ONLY)
CD4 % Helper T Cell: <1 % — ABNORMAL LOW (ref 33–55)
CD4 T Cell Abs: <10 uL — ABNORMAL LOW (ref 400–2700)

## 2010-05-19 ENCOUNTER — Ambulatory Visit: Payer: Self-pay | Admitting: Internal Medicine

## 2010-07-07 ENCOUNTER — Ambulatory Visit (INDEPENDENT_AMBULATORY_CARE_PROVIDER_SITE_OTHER): Payer: Self-pay | Admitting: Internal Medicine

## 2010-07-07 ENCOUNTER — Encounter: Payer: Self-pay | Admitting: Internal Medicine

## 2010-07-07 DIAGNOSIS — IMO0002 Reserved for concepts with insufficient information to code with codable children: Secondary | ICD-10-CM

## 2010-07-07 DIAGNOSIS — B20 Human immunodeficiency virus [HIV] disease: Secondary | ICD-10-CM

## 2010-07-07 DIAGNOSIS — Z23 Encounter for immunization: Secondary | ICD-10-CM

## 2010-07-07 NOTE — Progress Notes (Signed)
Addended by: Jennet Maduro D on: 07/07/2010 11:02 AM   Modules accepted: Orders

## 2010-07-07 NOTE — Assessment & Plan Note (Signed)
He has chronic low back pain. He is been disappointed by chiropractic therapy in the last few years and would like to retry physical therapy. He has tried to doing some exercises at home but is worried that it is making his pain worse.

## 2010-07-07 NOTE — Assessment & Plan Note (Signed)
His vital load is now much lower and he starting to have a very slow CD4 reconstitution. I will continue his current regimen.

## 2010-07-07 NOTE — Progress Notes (Signed)
  Subjective:    Patient ID: Matthew Cortez, male    DOB: 01-07-1961, 50 y.o.   MRN: [id number]  HPI Matthew Cortez is in for his routine visit. He has not missed a single dose of his medications and he is feeling better with the exception of his chronic low back pain and left leg numbness. He would like to try physical therapy again. 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 He was able to quit smoking cigarettes.    Review of Systems     Objective:   Physical Exam  Constitutional: He appears well-developed and well-nourished. No distress.  HENT:  Mouth/Throat: Oropharynx is clear and moist. No oropharyngeal exudate.  Cardiovascular: Normal rate and normal heart sounds.   No murmur heard. Pulmonary/Chest: Breath sounds normal. He has no wheezes. He has no rales.  Musculoskeletal:       He has some tenderness over his lower back bilaterally without any other evidence of inflammation. Sensation is intact to light touch in both legs. His strength is normal and his reflexes are normal. His gait is normal.          Assessment & Plan:

## 2010-07-07 NOTE — Progress Notes (Signed)
Addended by: Cliffton Asters on: 07/07/2010 10:00 AM   Modules accepted: Orders

## 2010-09-14 ENCOUNTER — Other Ambulatory Visit: Payer: Self-pay | Admitting: *Deleted

## 2010-09-14 DIAGNOSIS — B20 Human immunodeficiency virus [HIV] disease: Secondary | ICD-10-CM

## 2010-09-14 MED ORDER — RITONAVIR 100 MG PO TABS: 100.0000 mg | ORAL_TABLET | Freq: Every day | ORAL | Status: DC

## 2010-09-14 MED ORDER — DAPSONE 100 MG PO TABS: 100.0000 mg | ORAL_TABLET | Freq: Every day | ORAL | Status: DC

## 2010-09-14 MED ORDER — AZITHROMYCIN 600 MG PO TABS: 1200.0000 mg | ORAL_TABLET | ORAL | Status: DC

## 2010-09-14 MED ORDER — ZIDOVUDINE 300 MG PO TABS: 300.0000 mg | ORAL_TABLET | Freq: Two times a day (BID) | ORAL | Status: DC

## 2010-09-14 MED ORDER — EMTRICITABINE-TENOFOVIR DF 200-300 MG PO TABS: 1.0000 | ORAL_TABLET | Freq: Every day | ORAL | Status: DC

## 2010-09-14 MED ORDER — ATAZANAVIR SULFATE 300 MG PO CAPS: 300.0000 mg | ORAL_CAPSULE | Freq: Every day | ORAL | Status: DC

## 2010-09-18 ENCOUNTER — Ambulatory Visit: Payer: Self-pay

## 2010-10-26 ENCOUNTER — Other Ambulatory Visit: Payer: Self-pay | Admitting: *Deleted

## 2010-10-26 DIAGNOSIS — B20 Human immunodeficiency virus [HIV] disease: Secondary | ICD-10-CM

## 2010-10-26 MED ORDER — EMTRICITABINE-TENOFOVIR DF 200-300 MG PO TABS: 1.0000 | ORAL_TABLET | Freq: Every day | ORAL | Status: DC

## 2010-10-26 MED ORDER — ZIDOVUDINE 300 MG PO TABS: 300.0000 mg | ORAL_TABLET | Freq: Two times a day (BID) | ORAL | Status: DC

## 2010-10-26 MED ORDER — RITONAVIR 100 MG PO TABS: 100.0000 mg | ORAL_TABLET | Freq: Every day | ORAL | Status: DC

## 2010-10-26 MED ORDER — DAPSONE 100 MG PO TABS: 100.0000 mg | ORAL_TABLET | Freq: Every day | ORAL | Status: DC

## 2010-10-26 MED ORDER — ATAZANAVIR SULFATE 300 MG PO CAPS: 300.0000 mg | ORAL_CAPSULE | Freq: Every day | ORAL | Status: DC

## 2010-10-27 ENCOUNTER — Other Ambulatory Visit: Payer: Self-pay | Admitting: *Deleted

## 2010-10-27 DIAGNOSIS — B20 Human immunodeficiency virus [HIV] disease: Secondary | ICD-10-CM

## 2010-10-27 MED ORDER — ZIDOVUDINE 300 MG PO TABS: 300.0000 mg | ORAL_TABLET | Freq: Two times a day (BID) | ORAL | Status: DC

## 2010-10-27 MED ORDER — AZITHROMYCIN 600 MG PO TABS: 1200.0000 mg | ORAL_TABLET | ORAL | Status: DC

## 2010-10-27 MED ORDER — RITONAVIR 100 MG PO TABS: 100.0000 mg | ORAL_TABLET | Freq: Every day | ORAL | Status: DC

## 2010-10-27 MED ORDER — EMTRICITABINE-TENOFOVIR DF 200-300 MG PO TABS: 1.0000 | ORAL_TABLET | Freq: Every day | ORAL | Status: DC

## 2010-10-27 MED ORDER — ATAZANAVIR SULFATE 300 MG PO CAPS: 300.0000 mg | ORAL_CAPSULE | Freq: Every day | ORAL | Status: DC

## 2011-01-13 ENCOUNTER — Telehealth: Payer: Self-pay | Admitting: *Deleted

## 2011-01-13 NOTE — Telephone Encounter (Signed)
Toma Copier and [facility] are located in [place].  Pt informed.  Will call to find out which or both except his insurance.  If pt needs a referral this office will take care of this.

## 2011-02-11 DIAGNOSIS — C801 Malignant (primary) neoplasm, unspecified: Secondary | ICD-10-CM

## 2011-02-11 HISTORY — DX: Malignant (primary) neoplasm, unspecified: C80.1

## 2011-02-23 ENCOUNTER — Telehealth: Payer: Self-pay | Admitting: *Deleted

## 2011-02-23 DIAGNOSIS — C20 Malignant neoplasm of rectum: Secondary | ICD-10-CM

## 2011-02-23 NOTE — Telephone Encounter (Signed)
Please see if you can make an urgent referral to [facility] surgery here in town. Matthew Cortez will need to get his records from [facility] as soon as possible. Please call him and let him know that that would be my recommendation. He also needs to see me soon after blood work.

## 2011-02-23 NOTE — Telephone Encounter (Signed)
I contacted [facility]. They will see him 03/08/11 at 2pm. I called & told him this. I asked him to get the release signed so we have the info & the surgeon has the info. I asked that he make an appt to see Dr. Orvan Falconer after he has his labs. He is stopping by here to sign the release & will make the appts then

## 2011-02-23 NOTE — Telephone Encounter (Signed)
States he was diagnosed with cancer after a colonoscopy. States it has not spread & is located in lower rectum. Dr. Peters from [facility] sent him to a surgeon Dr. Rodenberg (in [place]). He suggested radiation & chemo. Told the pt that where it is if he had to do surgery, he would have to have a colostomy. He asked that Dr. Campbell call him ([phone] or cell [phone]) with the name of a good surgeon that will give him a second opinion. If md gets his voice mail, just leave the name of the surgeon he would recommend for a 2nd opinion. He has [employer]BS of [place] so finances should not prevent him from seeing another surgeon. 

## 2011-02-24 NOTE — Telephone Encounter (Signed)
Message left to remind pt to obtain records/CTs/colonoscopy results from all MDs seen for diagnosis of rectal cancer.  Will need to bring all records to the appt on Feb. 4 @ 1400 w/ Dr. Karie Soda at [facility] which the pt had already made for himself.  Referral placed in EPIC for the visit.

## 2011-02-24 NOTE — Telephone Encounter (Signed)
Addended by: Jennet Maduro D on: 02/24/2011 11:14 AM   Modules accepted: Orders

## 2011-02-24 NOTE — Telephone Encounter (Signed)
States he was diagnosed with cancer after a colonoscopy. States it has not spread & is located in lower rectum. Dr. Noe Gens from [facility] sent him to a surgeon Dr. Claudine Mouton (in [place]). He suggested radiation & chemo. Told the pt that where it is if he had to do surgery, he would have to have a colostomy. He asked that Dr. Orvan Falconer call him [phone] or cell [phone]) with the name of a good surgeon that will give him a second opinion. If md gets his voice mail, just leave the name of the surgeon he would recommend for a 2nd opinion. He has [employer] [place] so finances should not prevent him from seeing another Careers adviser.

## 2011-03-02 ENCOUNTER — Other Ambulatory Visit: Payer: BC Managed Care – PPO

## 2011-03-02 DIAGNOSIS — B20 Human immunodeficiency virus [HIV] disease: Secondary | ICD-10-CM

## 2011-03-03 ENCOUNTER — Telehealth (INDEPENDENT_AMBULATORY_CARE_PROVIDER_SITE_OTHER): Payer: Self-pay

## 2011-03-03 LAB — T-HELPER CELL (CD4) - (RCID CLINIC ONLY)
CD4 % Helper T Cell: 3 % — ABNORMAL LOW (ref 33–55)
CD4 T Cell Abs: 60 uL — ABNORMAL LOW (ref 400–2700)

## 2011-03-03 NOTE — Telephone Encounter (Signed)
LMOM at home and cell to call me.

## 2011-03-04 ENCOUNTER — Telehealth: Payer: Self-pay | Admitting: *Deleted

## 2011-03-04 LAB — HIV-1 RNA QUANT-NO REFLEX-BLD: HIV-1 RNA Quant, Log: 2.64 {Log} — ABNORMAL HIGH (ref <1.30)

## 2011-03-04 NOTE — Telephone Encounter (Signed)
States he signed a release last week for records to be sent to Dr. Michaell Cowing, a surgeon he is seeing next Monday. I found the release & it was for [facility] to fax their records to [place]. I told him this & that we have not rec'd them yet. I re-faxed it with a notation that this is a second request.   He will come by tomorrow & sign a release so we can send records from [place] to Dr. Michaell Cowing

## 2011-03-05 ENCOUNTER — Telehealth (INDEPENDENT_AMBULATORY_CARE_PROVIDER_SITE_OTHER): Payer: Self-pay

## 2011-03-05 NOTE — Telephone Encounter (Signed)
Pt returned my message about [place] not having his records yet for his appt with Dr Michaell Cowing for Monday. The pt has been working on this for 2 days trying to get his records from [facility] sent to Dr Blair Dolphin office and then sent to Dr Michaell Cowing. I told the pt that I haven't received anything yet. I even went and checked all fax machines and Dr Gordy Savers box while he was on hold @4 :45pm. I told the pt that I want be her Monday,but Lesly Rubenstein will be the nurse working with Dr Michaell Cowing to call her before he heads over to his appt. The pt said he might just go to [facility] himself to p/u his own records.

## 2011-03-08 ENCOUNTER — Ambulatory Visit (INDEPENDENT_AMBULATORY_CARE_PROVIDER_SITE_OTHER): Payer: BC Managed Care – PPO | Admitting: Surgery

## 2011-03-08 ENCOUNTER — Encounter (INDEPENDENT_AMBULATORY_CARE_PROVIDER_SITE_OTHER): Payer: Self-pay | Admitting: Surgery

## 2011-03-08 VITALS — BP 112/80 | HR 62 | Temp 97.4°F | Resp 18 | Ht 74.0 in | Wt 200.4 lb

## 2011-03-08 DIAGNOSIS — C210 Malignant neoplasm of anus, unspecified: Secondary | ICD-10-CM

## 2011-03-08 HISTORY — DX: Malignant neoplasm of anus, unspecified: C21.0

## 2011-03-08 NOTE — Patient Instructions (Signed)
A Curable Disease "Squamous cell carcinoma of the anus is a curable disease when diagnosed and treated early in its course." Squamous cell carcinoma of the anus is the most well known member of an uncommon group of GI neoplasms. The incidence of this tumor has been increasing slowly, with 3,500 cases reported in the [place] in 2001, and 5,260 cases reported in 2009. The Anal Canal  The anus is the most distal end of the colon. The surgical anal canal begins at the anorectal ring, which is the upper end of the puborectalis muscle and internal anal sphincter. It continues to the anal verge. The perianal region, marked by skin with hair bearing appendages, extends 5 cm out-ward from the anal verge. The proximal anal canal is lined by a columnar epitheilum. At the dentate line, located one to two centimeters proximal to the anal verge, the mucosa transitions to a squamous epithelium. Anal canal malignancies comprise eighty five per cent of all anal cancers. In anal canal cancers, there is a marked male to male preponderance of 5:1. However, in locales with large numbers of high-risk males, the ratio approaches 1:1. Perianal cancers are found almost equally in men and women. Homosexual males have a 10 to 30% higher incidence of cancer in all anal locations when compared with heterosexual males. HIV-positive men are at particularly higher risk. Size Matters Treatment is influenced by the size of the tumor, the depth of invasion, and by nodal involvement. Generally, the area above the dentate line has a lymphatic basin which drains into the superior rectal lymphatics and then to the inferior mesenteric nodes. Additionally, lateral drainage occurs through the middle and inferior lymphatics and the ischioanal fossa into the internal iliac nodes. Distal to the dentate line, drainage is primarily to the inguinal nodes. Knowledge of the lymphatic drainage is important in guiding therapy of anal cancers to ensure  that the nodal basins are included in the treatment. The TNM system used in the staging of squamous cell cancer of the anus places primary importance on tumor size, with T1 tumors measuring less than 2cm in diameter, T2 tumors measuring 2 to 5 cm in diameter, T3 tumors measuring greater than 5 cm in diameter and T4 tumors of any size demonstrating invasion of adjacent structures. Survival statistics and local control are inversely related to tumor size. In patients with synchronous or metachronous nodal involvement, or, in those with distant metastases, five year survival rates diminish dramatically. While there exists many associations between anal cancer and a variety of possible causes such as smoking, anoreceptive intercourse and various environmental factors, data points to the human papilloma virus (HPV) as the causative agent, similar to its causation in cervical cancer. Both sexual and non-sexual modes of HPV transmission may occur. Those at higher risk should be screened. Men with a history of homosexual or bisexual activity and/or anoreceptive intercourse, HIV-positive men and women, and women with cervical or vulvar lesions or carcinoma should be evaluated with physical exams and anal Papanicolou smears. With 95% accuracy, abnormal Pap smears should prompt a closer evaluation using anoscopy and biopsies of areas targeted by acetic acid staining. Biopsies demonstrating benign anal intraepithelial neoplasia (AIN) are graded as AIN I, II or III, with AIN III requiring treatment. Perianal Disease Squamous cell carcinoma of the perianal area does not differ from other skin based squamous cell cancers. The tumor appears as a central ulcer with rolled, everted edges. It may be small, less than 1 cm in size, or large, with  obstruction of the anal opening. These tumors are slow growing, often presenting with only local invasion. They are five times rarer than their anal canal counterparts. The sphincters are  usually spared. When lymphatic spread occurs, it is commonly to the inguinal and femoral nodes. Males and females are equally affected, with an average age at presentation of between 75 and 23. These lesions are often diagnosed up to twenty four months after the first appearance of symptoms, even though a mass, bleeding, pain, discharge or pruritus should have alerted the patient or physician to an abnormality at a much earlier time. Misdiagnosis is frequent, and patients may have been erroneously treated for hemorrhoidal disease or other benign conditions. Diagnostic delay does not seem to worsen the prognosis, underscoring the slow growing nature of these tumors. Treatment is similar to that of the more common anal canal cancers. Anal Canal Disease The more common anal canal squamous cell carcinoma is a tumor that has been given many names such as cloacogenic carcinoma, basaloid carcinoma or nonkeratinizing carcinoma. Patients may present with a hard, mobile or fixed mass on digital examination. Symptoms of an anal canal cancer may have been present for months to years before a correct diagnosis is suspected. Bleeding is common, as is anal pain, pruritus, discharge and a mass. Advanced tumors may present with incontinence, an anovaginal fistula, a change in bowel habits or pelvic pain. These symptoms may represent a tumor with sphincter involvement and later stage disease. A confirmatory biopsy performed through an anosocpe or proctoscope is diagnostic. However, an open surgical biopsy may be preferable both for enhanced visualization and patient comfort. The tumor location relative to the dentate line is noted. An endorectal ultrasound may be helpful in evaluating possible nodal disease. CT scanning will also be useful when evaluating the inguinal regions or the pelvis. Definitive biopsy should be performed on suspicious inguinal disease to differentiate nodal carcinoma which needs treatment, from  reactive hyperplasia. As many as 20% of patients have nodal involvement at the time of diagnosis and up to 25% may develop disease at a later time. Lymphatic spread seems to correlate with the size of the tumor, the depth of invasion and the histologic grade of the tumor. In tumors less than 2 cm in size, nodal spread is rare when compared to those malignancies larger than 2 cm, in which spread may be encountered in up to 35% of cases. With smooth muscle involvement, affected nodes may be found in 30% of patients and in 60% in those with tumor spread beyond the external sphincter. This Disease is Curable Formerly, surgical excision was the mainstay of treatment for all lesions, with wide local excision (WLE) used for smaller, more superficial lesions, and abdominoperineal resection (APR) used for larger, invasive lesions. Through experience, it has been found that WLE is associated with a high recurrence rate unless reserved for carcinoma in situ or T1 lesions. There must be a 1 cm margin of normal tissue at the excision site and the sphincters must be spared. Superficial tumors treated in this fashion have a 5-year survival rate approaching 100%. In properly selected cases, morbidity is minimal. However, for larger, T2, T3 or T4 lesions, WLE and APR have local recurrence rates as high as 60%, with survival rates decreasing dramatically. Distant metastases are common. Searching for methods to improve the treatment and survival following an abdominoperineal resection, Darol Destine, in 1974, began giving preoperative radiation therapy to the tumor, pelvis and inguinal nodes over 15 days, combined with 5-FU  and Mitomycin C. He found that there was a complete absence of tumor in many of the surgically resected specimens. Further study led to the present practice of treating all but the most superficial and non-invasive lesions with chemoradiation alone. An Abdominoperineal resection, with a possible radiation  boost, is reserved for patients with residual or recurrent disease after the initial chemoradiation. With chemoradiation, complete responses occur in up to 87% of cases, local control in up to 86%, with five-year survival rates between 66 and 92%. In T1 and T2 disease, complete response rates are as high as 90%. Tumors larger than 5cm (T3 and T4) are more problematic, with 50% requiring a salvage APR. However, if patients with these larger tumors are disease free at the conclusion of chemoradiation, only 25% will require a salvage APR. Currently, in HIV-positive patients with invasive anal cancer and CD4 levels greater than 200/cc, treatment is similar to that in uninfected patients. In those with lower CD4 counts, treatment remains individualized. With radiation doses greater than 40 Gy, the complication rate increases. Complications may be systemic, such as dermatitis, mucositis, diarrhea and incontinence, fatigue or bone marrow depression. Death is rare. Local complications include cystitis, small bowel obstruction and arterial stenosis. Anorectal function is commonly preserved in up to 90% of patients, but there may be associated anorectal irritability with tenesmus, proctitis, diarrhea, bleeding, urgency or incontinence. Most of these can be controlled with medication, but a proximal ostomy may become necessary. Far from causing further disability, an ostomy may be a welcome relief from symptoms caused by disease treatment. The Good News Squamous cell carcinoma of the anus is a curable disease when diagnosed and treated early in its course.

## 2011-03-08 NOTE — Progress Notes (Signed)
Subjective:     Patient ID: Matthew Cortez, male   DOB: 1960-07-31, 51 y.o.   MRN: [id number]  HPI  Matthew Cortez  [birth year] [id number]  Patient Care Team: Cliffton Asters, MD as PCP - General (Infectious Diseases) Cliffton Asters, MD as PCP - Infectious Diseases (Infectious Diseases) Arlice Colt as Consulting Physician (Gastroenterology) Lucile Shutters, MD as Attending Physician (Internal Medicine) Jonna Coup, MD as Consulting Physician (Radiation Oncology) Campbell Lerner as Referring Physician (Surgery)  This patient is a 51 y.o.male who presents today for surgical evaluation at the request of Dr. Orvan Falconer.   Reason for visit: Cancer. Consideration of surgery.  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 No episodes of AIDS. He has had episodes of rectal fullness. Began to have some abdominal and rectal pains. He was sent to gastroenterology. Dr. [name] did endoscopy which revealed a fungating mass in the anal canal. Biopsy was consistent with squamous cell carcinoma. CT scan did not show any obvious metastatic disease. He had some ultrasounds & EGD is done before that which were underwhelming.  He was sent to see a surgeon in [facility], Dr. Claudine Mouton.  He is confused about the diagnosis & location.  The patient recalls discussion of chemotherapy and radiation therapy. He recalls a discussion about possible colostomy. He was concerned. He wanted a second opinion. He was sent our group for this.  The patient has no incontinence to stool or flatus. He thinks he may have had warts in the past but is not certain. He feels like the caliber of his stools have narrowed. He feels is swelling of the rectum. Occasionally the bowel movements are painful. Trying to use stool softeners.  He initially came with no records at all. However at the end of the visit, I was able to get Dr. Noe Gens his records which were very helpful. No films are available for  direct viewing but I do have reports.  Patient Active Problem List  Diagnoses  . HIV INFECTION  . CIGARETTE SMOKER  . BRONCHITIS, ACUTE  . CHOLELITHIASIS  . DEGENERATIVE DISC DISEASE  . BACK PAIN, LUMBAR, WITH RADICULOPATHY  . Squamous cell carcinoma of anus    Past Medical History  Diagnosis Date  . HIV infection   . Anal condyloma   . Abdominal pain   . Difficulty urinating   . Constipation   . Nausea   . Rectal pain   . 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 No pertinent past surgical history.  History   Social History  . Marital Status: Single    Spouse Name: N/A    Number of Children: N/A  . Years of Education: N/A   Occupational History  . Not on file.   Social History Main Topics  . Smoking status: Former Smoker    Quit date: 03/07/2010  . Smokeless tobacco: Never Used  . Alcohol Use: 6.0 oz/week    12 drink(s) per week     3 glasses of wine per week  . Drug Use: No  . Sexually Active: Not Currently     declined condoms   Other Topics Concern  . Not on file   Social History Narrative  . No narrative on file    Family History  Problem Relation Age of Onset  . Cancer Mother     abdominal tumor  . Cancer Maternal Aunt     breast    Current outpatient prescriptions:atazanavir (REYATAZ)  300 MG capsule, Take 1 capsule (300 mg total) by mouth daily., Disp: 90 capsule, Rfl: 3;  azithromycin (ZITHROMAX) 600 MG tablet, Take 2 tablets (1,200 mg total) by mouth every 7 (seven) days., Disp: 10 tablet, Rfl: 11;  dapsone 100 MG tablet, Take 1 tablet (100 mg total) by mouth daily., Disp: 30 tablet, Rfl: prn emtricitabine-tenofovir (TRUVADA) 200-300 MG per tablet, Take 1 tablet by mouth daily., Disp: 90 tablet, Rfl: 3;  fluconazole (DIFLUCAN) 100 MG tablet, Take 100 mg by mouth once a week.  , Disp: , Rfl: ;  ritonavir (NORVIR) 100 MG TABS, Take 1 tablet (100 mg total) by mouth daily., Disp: 90 tablet, Rfl: 3;  zidovudine (RETROVIR) 300 MG  tablet, Take 1 tablet (300 mg total) by mouth 2 (two) times daily., Disp: 120 tablet, Rfl: 3  Allergies  Allergen Reactions  . Amoxicillin     REACTION: rash  . Doxycycline   . Nevirapine     REACTION: Stevens-Johnson syndrome  . Penicillins   . Sulfonamide Derivatives     BP 112/80  Pulse 62  Temp(Src) 97.4 F (36.3 C) (Temporal)  Resp 18  Ht 6\' 2"  (1.88 m)  Wt 200 lb 6.4 oz (90.901 kg)  BMI 25.73 kg/m2    Review of Systems  Constitutional: Positive for fatigue. Negative for fever, chills and diaphoresis.  HENT: Negative for nosebleeds, sore throat, facial swelling, mouth sores, trouble swallowing and ear discharge.   Eyes: Negative for photophobia, discharge and visual disturbance.  Respiratory: Negative for choking, chest tightness, shortness of breath and stridor.   Cardiovascular: Negative for chest pain and palpitations.       Patient walks 60 minutes for about 1.5 miles without difficulty.  No exertional chest/neck/shoulder/arm pain.   Gastrointestinal: Positive for nausea, abdominal pain, constipation and rectal pain. Negative for vomiting, diarrhea, blood in stool, abdominal distention and anal bleeding.  Genitourinary: Positive for difficulty urinating. Negative for dysuria, urgency, frequency, flank pain, genital sores and testicular pain.  Musculoskeletal: Negative for myalgias, back pain, arthralgias and gait problem.  Skin: Negative for color change, pallor, rash and wound.  Neurological: Negative for dizziness, speech difficulty, weakness, numbness and headaches.  Hematological: Negative for adenopathy. Does not bruise/bleed easily.  Psychiatric/Behavioral: Negative for hallucinations, confusion and agitation.       Objective:   Physical Exam  Constitutional: He is oriented to person, place, and time. He appears well-developed and well-nourished. No distress.  HENT:  Head: Normocephalic.  Mouth/Throat: Oropharynx is clear and moist. No oropharyngeal  exudate.  Eyes: Conjunctivae and EOM are normal. Pupils are equal, round, and reactive to light. No scleral icterus.  Neck: Normal range of motion. Neck supple. No tracheal deviation present.  Cardiovascular: Normal rate, regular rhythm and intact distal pulses.   Pulmonary/Chest: Effort normal and breath sounds normal. No respiratory distress.  Abdominal: Soft. He exhibits no distension. There is no tenderness. Hernia confirmed negative in the right inguinal area and confirmed negative in the left inguinal area.  Genitourinary:       Perianal skin clean with good hygiene.  No pruritis.  No pilonidal disease.  No fissure.  No abscess/fistula.    A few small skin masses (?warts) circumferential around anus to 5cm  Tolerates digital rectal exam.  Normal sphincter tone.   Large warts in anorectal canal with Left posterolateral anal canal firm fixed mass.  Musculoskeletal: Normal range of motion. He exhibits no tenderness.  Lymphadenopathy:    He has no cervical adenopathy.  Right: No inguinal adenopathy present.       Left: No inguinal adenopathy present.  Neurological: He is alert and oriented to person, place, and time. No cranial nerve deficit. He exhibits normal muscle tone. Coordination normal.  Skin: Skin is warm and dry. No rash noted. He is not diaphoretic. No erythema. No pallor.  Psychiatric: He has a normal mood and affect. His behavior is normal. Judgment and thought content normal.       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 I think standard would be to do the nigra protocol with chemotherapy radiation therapy. I discussed with medical oncology. Dr. Truett Perna or his partner will meet the patient to get going with care.   Work to see radiation oncology as well.  I will reserve colostomy/abdominal perineal  resection as a backup option should the chemoradiation therapy not cure it since it is of moderate bulk  We will get a PET CT scan to make sure there is no evidence of lymph spread since that would affect the radiation fields.  I gave the patient options to have treatment in [facility] or closer to where we are. The patient and his partner would prefer to go through [facility].   They are close to [facility] especially.  We will establish those relationships. I gave her information on anal canal squamous cell cancer as well. He is HIV positive, but his counts are good. He should be able to tolerate procedure since otherwise quite active and otherwise rather healthy. The patient and his partner expressed understanding and appreciation

## 2011-03-09 ENCOUNTER — Encounter: Payer: Self-pay | Admitting: *Deleted

## 2011-03-09 ENCOUNTER — Telehealth: Payer: Self-pay | Admitting: Hematology and Oncology

## 2011-03-09 ENCOUNTER — Encounter (INDEPENDENT_AMBULATORY_CARE_PROVIDER_SITE_OTHER): Payer: Self-pay

## 2011-03-09 NOTE — Telephone Encounter (Signed)
lmonvm for pt re calling me for appt w/LO. 1st attempt. °

## 2011-03-10 ENCOUNTER — Telehealth: Payer: Self-pay | Admitting: Hematology and Oncology

## 2011-03-10 ENCOUNTER — Ambulatory Visit
Admission: RE | Admit: 2011-03-10 | Discharge: 2011-03-10 | Disposition: A | Discharge status: Home / Self Care | Payer: BC Managed Care – PPO | Source: Ambulatory Visit | Attending: Radiation Oncology | Admitting: Radiation Oncology

## 2011-03-10 ENCOUNTER — Encounter: Payer: Self-pay | Admitting: Radiation Oncology

## 2011-03-10 VITALS — BP 114/68 | HR 69 | Temp 97.2°F | Resp 20 | Ht 74.0 in | Wt 198.9 lb

## 2011-03-10 DIAGNOSIS — Z510 Encounter for antineoplastic radiation therapy: Secondary | ICD-10-CM | POA: Insufficient documentation

## 2011-03-10 DIAGNOSIS — Z87891 Personal history of nicotine dependence: Secondary | ICD-10-CM | POA: Insufficient documentation

## 2011-03-10 DIAGNOSIS — Z79899 Other long term (current) drug therapy: Secondary | ICD-10-CM | POA: Insufficient documentation

## 2011-03-10 DIAGNOSIS — Z21 Asymptomatic human immunodeficiency virus [HIV] infection status: Secondary | ICD-10-CM | POA: Insufficient documentation

## 2011-03-10 DIAGNOSIS — C211 Malignant neoplasm of anal canal: Secondary | ICD-10-CM | POA: Diagnosis present

## 2011-03-10 DIAGNOSIS — R5383 Other fatigue: Secondary | ICD-10-CM | POA: Insufficient documentation

## 2011-03-10 DIAGNOSIS — C210 Malignant neoplasm of anus, unspecified: Secondary | ICD-10-CM

## 2011-03-10 NOTE — Telephone Encounter (Signed)
Talked to pt, he is aware of appt on 2/12, he is also aware of location

## 2011-03-10 NOTE — Progress Notes (Signed)
Pt states he has anal pain, nausea more so in mornings. Tylenol or Ibuprofen prn "takes edge off". Pt having difficulty sleeping due to pain during night. Stool softeners prn; having BM's is very painful.   Pt is Occupational hygienist at [employer].

## 2011-03-10 NOTE — Progress Notes (Signed)
Please see the Nurse Progress Note in the MD Initial Consult Encounter for this patient. 

## 2011-03-11 ENCOUNTER — Encounter (HOSPITAL_COMMUNITY)
Admission: RE | Admit: 2011-03-11 | Discharge: 2011-03-11 | Disposition: A | Discharge status: Home / Self Care | Payer: BC Managed Care – PPO | Source: Ambulatory Visit | Attending: Surgery | Admitting: Surgery

## 2011-03-11 ENCOUNTER — Encounter (HOSPITAL_COMMUNITY): Payer: Self-pay

## 2011-03-11 DIAGNOSIS — C210 Malignant neoplasm of anus, unspecified: Secondary | ICD-10-CM | POA: Insufficient documentation

## 2011-03-11 LAB — GLUCOSE, CAPILLARY: Glucose-Capillary: 105 mg/dL — ABNORMAL HIGH (ref 70–99)

## 2011-03-11 MED ORDER — FLUDEOXYGLUCOSE F - 18 (FDG) INJECTION
17.6000 | Freq: Once | INTRAVENOUS | Status: AC | PRN
  Administered 2011-03-11: 17.6 via INTRAVENOUS

## 2011-03-11 NOTE — Progress Notes (Signed)
CC:   Ardeth Sportsman, MD Laurice Record, M.D. Cliffton Asters, M.D. [name], MD  REFERRING PHYSICIAN:  Ardeth Sportsman, MD  DIAGNOSIS:  Squamous cell carcinoma of the anal canal.  HISTORY OF PRESENT ILLNESS:  The patient is a 51 year old male who indicates that he began having some anal or rectal pain in November of 2012.  He began noticing it then, and then noticed it more prominently with more severe pain in December.  Right now he states that he does have some constant pain in the anal region, although this is worse with bowel movements.  He especially notices this increased pain with bowel movements if he has had some constipation for a day or 2, although this is much better if he continues on daily bowel movements.  He has been taking some Tylenol which does help this pain.  He has been given some Vicodin, but he has been hesitant to take this and, as of now at least, the Tylenol has been working okay for him.  The patient does have a history of HIV and was diagnosed he states about 23 years ago.  He is followed by Dr. Orvan Falconer for this, and he indicates that overall he has done very well and he has not had any real problem with significant manifestations from this such as opportunistic infections.  The patient sought medical attention for this ongoing rectal pain and an endoscopy did reveal a fungating mass within the anal canal.  A biopsy was performed, and this revealed an invasive squamous cell carcinoma.  The patient's workup has also included a CT scan of the abdomen and pelvis at the [facility].  There was no acute findings present on this with no clear sign of metastatic disease.  The patient has been seen by Dr. Michaell Cowing who discuss possible management in general terms with him for this, and I have been asked by Dr. Michaell Cowing to see the patient for consider of radiation today.  It is my understanding that he is being scheduled to be seen by Medical Oncology as  well, and I believe that he is being set up to see Dr. Dalene Carrow.  The patient does live over [facility], but he stated that he did wish to be treated here, at [facility], for both chemotherapy and radiation if this was recommended for.  PAST MEDICAL HISTORY:  HIV, history of cholelithiasis, degenerative disc disease, history of lumbar back pain.  CURRENT MEDICATIONS:  Tylenol, Reyataz, Zithromax, dapsone, Surfak, Truvada, Diflucan, Advil, Norvir, Retrovir.  ALLERGIES:  Doxycycline, sulfa derivatives, amoxicillin, nevirapine, and penicillins.  FAMILY HISTORY:  The patient states that his mother had an abdominal tumor of unknown primary site, appears to also have an aunt with breast cancer.  SOCIAL HISTORY:  The patient is single and lives near [facility].  He is a former smoker and quit in February of 2012.  He has a history of smoking off and on for 20 years.  The patient does drink alcohol.  REVIEW OF SYSTEMS:  Reviewed and negative other than as above.  PHYSICAL EXAM:  Vital signs: Weight 198 pounds, blood pressure 114/68, pulse 69, temperature 97.2, and respiratory rate 20.  General:  Well- developed male in no acute distress.  Alert and oriented x3.  HEENT: Normocephalic atraumatic.  Pupils equal, round, and reactive to light. Extraocular movements intact.  Oral cavity clear.  Neck:  Supple without lymphadenopathy.  Cardiovascular:  Regular rate and rhythm. Respiratory: Clear to auscultation bilaterally.  GI:  Abdomen soft, nontender.  Bowel sounds.  Extremities:  No edema present.  Neuro: No focal deficits.  On rectal exam, there is a firm mass within the lateral aspect of the anal canal posteriorly with multiple additional softer masses present consistent with warts, circumferentially, especially posteriorly.  No blood on exam glove, and the patient tolerated the digital rectal exam satisfactorily. Normal sphincter tone present.  IMPRESSION AND PLAN:  Mr. macdonnell is a pleasant  51 year old male with a recent diagnosis of squamous cell carcinoma of the anal canal.  He does have a PET scan pending for tomorrow and as of now, clinically, I would stage him as a T2 N0 tumor.  No clear evidence of regional or distant disease was seen on his staging CT scans that the PET scan will help clarify this issue.  I believe that the patient is an appropriate candidate to proceed with chemoradiotherapy, and I discussed the general management of anal cancer with him including this initial treatment with possible salvage surgery if necessary.  I did also discuss with him the typical course of treatment with radiation which would consist of 5-1/2 to 6 weeks with concurrent chemotherapy.  We did also discuss the possible implications of his HIV status, although it appears he has done well with this and I do not believe that major modifications to the treatment plan would be necessary for his case up front.  Certainly we would follow him closely during treatment.  The patient is to proceed with a PET scan tomorrow, and we will schedule him for a simulation, hopefully soon after this, potentially as early as Friday.  I would treat him with an IMRT technique on our Tomotherapy Unit with daily image guidance to ensure proper localization of the target, and he is aware that we would treat both the primary tumor and the lymph node regions at risk.  I therefore look forward to seeing Mr. Shifflett around later this week such that we can proceed with treatment planning.  He does know that he needs to see Medical Oncology as well which will also be a component of his treatment plan.    ______________________________ Radene Gunning, M.D., Ph.D. JSM/MEDQ  D:  03/10/2011  T:  03/11/2011  Job:  [id number]

## 2011-03-12 ENCOUNTER — Ambulatory Visit
Admission: RE | Admit: 2011-03-12 | Discharge: 2011-03-12 | Disposition: A | Discharge status: Home / Self Care | Payer: BC Managed Care – PPO | Source: Ambulatory Visit | Attending: Radiation Oncology | Admitting: Radiation Oncology

## 2011-03-12 ENCOUNTER — Telehealth: Payer: Self-pay | Admitting: Hematology and Oncology

## 2011-03-12 DIAGNOSIS — C210 Malignant neoplasm of anus, unspecified: Secondary | ICD-10-CM

## 2011-03-12 NOTE — Telephone Encounter (Signed)
Del.Chart °

## 2011-03-12 NOTE — Progress Notes (Signed)
Met with patient to discuss RO billing.  Patient had no concerns today. 

## 2011-03-16 ENCOUNTER — Ambulatory Visit: Payer: BC Managed Care – PPO

## 2011-03-16 ENCOUNTER — Other Ambulatory Visit: Payer: BC Managed Care – PPO | Admitting: Lab

## 2011-03-16 ENCOUNTER — Encounter: Payer: Self-pay | Admitting: Internal Medicine

## 2011-03-16 ENCOUNTER — Ambulatory Visit (HOSPITAL_BASED_OUTPATIENT_CLINIC_OR_DEPARTMENT_OTHER): Payer: BC Managed Care – PPO | Admitting: Hematology and Oncology

## 2011-03-16 ENCOUNTER — Ambulatory Visit (INDEPENDENT_AMBULATORY_CARE_PROVIDER_SITE_OTHER): Payer: BC Managed Care – PPO | Admitting: Internal Medicine

## 2011-03-16 ENCOUNTER — Telehealth: Payer: Self-pay | Admitting: *Deleted

## 2011-03-16 VITALS — BP 124/77 | HR 56 | Temp 96.8°F | Ht 74.5 in | Wt 198.0 lb

## 2011-03-16 DIAGNOSIS — B20 Human immunodeficiency virus [HIV] disease: Secondary | ICD-10-CM

## 2011-03-16 DIAGNOSIS — C210 Malignant neoplasm of anus, unspecified: Secondary | ICD-10-CM

## 2011-03-16 MED ORDER — SENNOSIDES-DOCUSATE SODIUM 8.6-50 MG PO TABS: 1.0000 | ORAL_TABLET | Freq: Every day | ORAL | Status: DC

## 2011-03-16 MED ORDER — HYDROCODONE-ACETAMINOPHEN 7.5-750 MG PO TABS: 1.0000 | ORAL_TABLET | Freq: Four times a day (QID) | ORAL | Status: DC | PRN

## 2011-03-16 MED ORDER — PROCHLORPERAZINE MALEATE 10 MG PO TABS: 10.0000 mg | ORAL_TABLET | Freq: Three times a day (TID) | ORAL | Status: DC | PRN

## 2011-03-16 MED ORDER — DARUNAVIR ETHANOLATE 800 MG PO TABS: 1.0000 | ORAL_TABLET | Freq: Every day | ORAL | Status: DC

## 2011-03-16 NOTE — Progress Notes (Signed)
Addended by: Normajean Baxter LE on: 03/16/2011 04:15 PM   Modules accepted: Orders

## 2011-03-16 NOTE — Progress Notes (Signed)
This office note has been dictated.

## 2011-03-16 NOTE — Progress Notes (Signed)
Dr.    Cliffton Asters      -      Primary  ID. Dr.    Rodena Piety      -     GI  @  [facility] in  [place].. Dr.    Mitzi Hansen    -     XRT. Dr.    Gordy Savers      -      Surgeon.  Advice worker   On   [employer].  Cell   Phone       [phone].

## 2011-03-16 NOTE — Telephone Encounter (Signed)
appts sch and printed per pof from 2/12   aom

## 2011-03-16 NOTE — Telephone Encounter (Signed)
Spoke with  Inetta Fermo in radiology re:   Pt  Is scheduled for  PICC line insertion on Fri.  03/26/11.    Inetta Fermo also spoke with pt and gave necessary instructions.

## 2011-03-16 NOTE — Progress Notes (Signed)
Patient ID: Matthew Cortez, male   DOB: 1960-11-11, 51 y.o.   MRN: [id number]  INFECTIOUS DISEASE PROGRESS NOTE    Subjective: Matthew Cortez is in for his followup visit. He has been diagnosed with adenocarcinoma of the anal canal and is scheduled to start radiation therapy and chemotherapy soon. He is been having severe pain unresponsive to hydrocodone prescribed by his gastroenterologist in [facility]. He is not sure what dose he has been given but says that it is no better than taking regular Tylenol which she takes about 6-8 of the every day. He states that at best his pain is around a 6/10 but often 10 out of 10. He describes the pain as being a dull ache deep in his pelvis and rectum. It radiates down his thighs. He also has intermittent cramping abdominal pain with nausea. His pain tends to be worse in the early morning.  He states that he is also underwent an EGD and was found to have ulcers. He initially was prescribed a proton pump inhibitor but the interaction with his Reyataz was noted and he was switched to Zantac. He is taking that twice daily along with one other medication for his ulcer that he does not know the name of.  He states that since December when all of this began he thinks he is missed at least 6 doses of his HIV medications due to the pain and nausea.  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 He is interested in trying to get time off of work if he can obtain the short-term disability so that he can continue his insurance coverage.  Objective: Temp: 97.7 F (36.5 C) (02/12 0926) Temp src: Oral (02/12 0926) BP: 151/91 mmHg (02/12 0926) Pulse Rate: 68  (02/12 0926)  General: He is pale and obviously in pain Skin: No rash Lungs: Clear Cor: Regular S1 and S2 no murmurs Abdomen: Soft with moderate lower quadrant tenderness and quiet bowel sounds   Lab Results HIV 1 RNA Quant (copies/mL)  Date Value  03/02/2011 440*  05/05/2010 106*  01/12/2010 456*      CD4 T Cell Abs (cmm)  Date Value  03/02/2011 60*  05/05/2010 30*  01/12/2010 20*     Assessment: He is having severe, constant pain and work with him to try to get this under much better control. He also will need to switch his Reyataz to Prezista to avoid interaction with acid inhibiting drugs. I will give him a pillbox to help organize his extensive medication regimen and encouraged him to try not to Miss a single dose of his antiretroviral medications.  Plan: 1. Change Reyataz to Prezista and continue other antiretroviral medications. 2. Improved pain control 3. Start Senokot-S 4. Pillbox 5. Return to clinic in 2 weeks   Cliffton Asters, MD [facility] [phone] pager   [phone] cell 03/16/2011, 10:01 AM

## 2011-03-16 NOTE — Progress Notes (Signed)
CC:   Matthew Cortez, M.D. [name], MD Ardeth Sportsman, MD Radene Gunning, M.D., Ph.D.  IDENTIFYING STATEMENT:  The patient is a 51 year old man seen at the request of Dr. Cliffton Cortez with squamous cell carcinoma of the anus.  HISTORY OF PRESENT ILLNESS:  The patient has a 25-year history for HIV, and he presented 2 to 3 months ago with dull ache in the rectal and pelvic area associated with profound fatigue.  He also noted some proneness toward constipation.  The patient requested a colonoscopy.  He saw Dr. [name] at [facility], who on 02/11/2011 performed a colonoscopy that revealed a large circumferential mass at the anorectal margin.  The mass was biopsied and was positive for a well- differentiated invasive squamous cell carcinoma of the anus.  The rest of the colon was unremarkable.  The patient then underwent an upper endoscopy that noted gastric ulcers.  As per patient, the ulcers were biopsied and were and were nonmalignant or infectious.  He was placed on Zantac.  On 01/21/2011 he received a CT scan of the abdomen and pelvis that documents clear lung bases with no evidence of inflammatory disease or mass within the abdomen and pelvis.  The liver was unremarkable.  His last viral load on 03/02/2011 was 440 copies and CD4 count 60.  Matthew Cortez has consulted with Dr. Karie Soda and Dr. Dorothy Puffer.  Dr. Dorothy Puffer plans to begin radiation on March 23, 2011.  The patient is here to discuss the oncology aspect of his care.  In the interim he received a PET scan on 03/11/2011.  There was a large circumferential mass within the most inferior rectum measuring 5.3 x 5.1 x 6 cm, which was intensely hypermetabolic with an SUV of 25.4.  There were 2 small hypermetabolic lymph nodes within the perirectal fat with an SUV of 5.3, and just more superior in the left internal iliac lymph node measuring 9 mm had an SUV of 4.7.  A small hypermetabolic left inguinal lymph  node measuring 6 mm had an SUV of 3.8.  There was no clear evidence of more distal nodal metastases.  There was no evidence of systemic metastases.  PAST MEDICAL HISTORY: 1. HIV for 25 years. 2. Degenerative joint disease. 3. History of cholelithiasis.  ALLERGIES:  Doxycycline causes hives; sulfonamides, amoxicillin, nevirapine, penicillin.  MEDICATIONS:  Prezista 800 mg once daily, Truvada 200/200/300 mg 1 tablet daily, Diflucan 100 mg weekly, Vicodin 1-2 tablets every 6 hours p.r.n., Zantac 400 mg twice a day, Norvir 100 mg daily, Retrovir 300 mg 2 times a day, Tylenol 325 mg every 6 hours as needed for pain, Zithromax 300 mg 2 tablets weekly, dapsone 100 mg daily.  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  He is single but has a partner.  He is a former smoker, quit in February 2012, and smoked 1-2 cigarettes for several years.  He drinks a glass of wine at night but not so recently.  FAMILY HISTORY:  Patient's mother had a cancer of unknown primary. Maternal aunt had breast cancer.  HEALTH MAINTENANCE:  Upper GI and colonoscopy are noted above.  REVIEW OF SYSTEMS:  Denies fever, chills, night sweats, anorexia, weight loss.  Documents rectal discomfort with defecation, which currently rates as 2/10.  GI:  Denies nausea, vomiting, abdominal pain, diarrhea, melena, hematochezia.  Prone to constipation.  Cardiovascular:  Denies chest pain, PND, orthopnea, ankle swelling.  Respirations:  Denies cough,  hemoptysis, wheeze, shortness of breath.  Skin:  Denies bruising or bleeding.  Neurologic:  Denies headache, vision changes, extremity weakness.  Rest review of systems negative.  PHYSICAL EXAMINATION:  The patient is a well-appearing, well-nourished man in no distress.  Vital signs:  Pulse 56, blood pressure 124/77, temperature 96.8, respirations 16, weight 198 pounds.  HEENT:  Head is atraumatic, normocephalic.  Sclerae anicteric.  Extraocular  muscles intact.  Pupils equal, round and reactive to light.  Mouth moist without ulcerations, thrush or lesions.  Neck:  Supple without thyromegaly. Trachea is central.  Chest:  Demonstrates good entry bilaterally and is clear to both percussion and auscultation.  Cardiovascular:  Reveals 1st and 2nd heart sounds to be present.  No added sounds or murmurs. Abdomen:  Soft, nontender.  There are no superficial masses.  There is no hepatosplenomegaly.  Bowel sounds are present.  Extremities do not reveal any edema.  No calf tenderness.  Pulses were present and symmetrical.  Lymph nodes:  No adenopathy.  CNS:  Nonfocal.  IMPRESSION/PLAN:  Matthew Cortez is a pleasant 51 year old man with a 25-year history of human immunodeficiency virus, on highly active antiretroviral therapy, who was recently diagnosed with an invasive squamous cell carcinoma of the anus.  The patient presents to discuss treatment options.  The chemotherapy aspect of treatment would include mitomycin and 5-FU with mitomycin given on day 1 and 5-FU given as a continuous infusion from days 1 through 4.  This regimen will be repeated again during the last week of radiation.  He will require a PICC line, and this will be placed with Interventional Radiology.  He and his partner will be attending chemotherapy class.  We discussed logistics and side effects of therapy. This includes, was not limited to myelosuppression, nausea, vomiting, fatigue, alopecia, chest pain and in rare cases TTP.  The patient was told that his labs will be monitored carefully.  He will attend chemotherapy class.  Chemotherapy has been scheduled to hopefully begin March 29, 2011, and I will ask if Dr. Mitzi Hansen can begin radiation concurrently.  The patient had a number of questions, which were answered.  He follows up soon.  I spent more than half the time coordinating care.    ______________________________ Laurice Record, M.D. LIO/MEDQ  D:  03/16/2011  T:   03/16/2011  Job:  [id number]

## 2011-03-17 ENCOUNTER — Telehealth: Payer: Self-pay | Admitting: *Deleted

## 2011-03-17 ENCOUNTER — Other Ambulatory Visit (HOSPITAL_COMMUNITY): Payer: BC Managed Care – PPO

## 2011-03-17 NOTE — Telephone Encounter (Signed)
Late entry:  This RN met with patient during 03/16/11 office visit with Dr. Dalene Carrow.  Spent time assessing if there were any current needs and offered to be a resource should any assistance occur in the way of education, emotional support, appointment coordination, etc.  Patient did not have any requests and verbalized understanding.

## 2011-03-18 ENCOUNTER — Other Ambulatory Visit: Payer: BC Managed Care – PPO

## 2011-03-18 ENCOUNTER — Encounter: Payer: Self-pay | Admitting: *Deleted

## 2011-03-22 ENCOUNTER — Telehealth: Payer: Self-pay | Admitting: *Deleted

## 2011-03-22 NOTE — Telephone Encounter (Signed)
Hydrocodone/APAP rx was 5/500, qd tab reported by pharmacist from [facility].

## 2011-03-23 ENCOUNTER — Ambulatory Visit: Payer: BC Managed Care – PPO | Admitting: Nutrition

## 2011-03-23 ENCOUNTER — Ambulatory Visit: Payer: BC Managed Care – PPO

## 2011-03-23 NOTE — Assessment & Plan Note (Signed)
NUTRITION ASSESSMENT:  Matthew Cortez is a 51 year old male patient of Dr. Lonell Face diagnosed with anal cancer.  HISTORY:  Includes HIV, tobacco and DDD.  MEDICATIONS:  Include Diflucan, Compazine, Zantac and senna.  LABS:  Include a glucose of 105.  Height is 6 feet 2 inches, weight 198 pounds.  BMI is 25.09.  The patient reports that he has been to chemo education class.  The plan is for chemotherapy and radiation combined.  He would like information on a healthy diet during chemotherapy and radiation treatment.  He reports he currently eats a fairly healthy diet.  He does not consume caffeine.  He avoids red meat.  He does not usually have takeout food or foods that have been prepared in restaurants.  He primarily drinks water and green tea.  He eats a variety of foods every day.  Historically he has dealt with diarrhea from some of his other medications.  He is experiencing a little constipation and he reports early morning nausea that is not controlled with his current nausea medication.  NUTRITION DIAGNOSIS:  Food and nutrition related knowledge deficit related to new diagnosis of anal cancer and associated treatments as evidenced by no prior need for nutrition related information.  INTERVENTION:  I have educated the patient and his partner on the importance of a healthy plant based diet with adequate protein to promote weight maintenance.  I have encouraged the patient to experiment with ways to control nausea including ginger.  If his nausea continues he is to be sure to communicate with his physician that nausea medication is not working.  I have encouraged him to continue Ensure in the morning.  He is drinking 1 of these a day.  I have educated him on the importance of high protein foods for healing.  I briefly discussed strategies for dealing with diarrhea which may result from his treatment as well as some brief discussion on how to manage constipation.  We have discussed the importance of  increased fluids.  I provided him with multiple fact sheets and contact information.  I have answered his questions.  MONITORING/EVALUATION (GOALS):  The patient will tolerate a healthy plant based, high protein diet to promote weight maintenance during treatment with minimal side effects.  NEXT VISIT:  The patient will call me with questions or concerns.    ______________________________ Zenovia Jarred, RD, LDN Clinical Nutrition Specialist BN/MEDQ  D:  03/23/2011  T:  03/23/2011  Job:  [id number]

## 2011-03-24 ENCOUNTER — Ambulatory Visit: Payer: BC Managed Care – PPO

## 2011-03-25 ENCOUNTER — Ambulatory Visit: Payer: BC Managed Care – PPO

## 2011-03-25 ENCOUNTER — Telehealth (HOSPITAL_COMMUNITY): Payer: Self-pay | Admitting: Diagnostic Radiology

## 2011-03-26 ENCOUNTER — Other Ambulatory Visit: Payer: Self-pay | Admitting: Hematology and Oncology

## 2011-03-26 ENCOUNTER — Other Ambulatory Visit: Payer: Self-pay | Admitting: Pharmacist

## 2011-03-26 ENCOUNTER — Ambulatory Visit (HOSPITAL_COMMUNITY)
Admission: RE | Admit: 2011-03-26 | Discharge: 2011-03-26 | Disposition: A | Discharge status: Home / Self Care | Payer: BC Managed Care – PPO | Source: Ambulatory Visit | Attending: Hematology and Oncology | Admitting: Hematology and Oncology

## 2011-03-26 ENCOUNTER — Ambulatory Visit: Payer: BC Managed Care – PPO

## 2011-03-26 DIAGNOSIS — C210 Malignant neoplasm of anus, unspecified: Secondary | ICD-10-CM

## 2011-03-26 MED ORDER — LIDOCAINE HCL 1 % IJ SOLN
INTRAMUSCULAR | Status: AC
  Filled 2011-03-26: qty 20

## 2011-03-26 NOTE — Procedures (Signed)
Left basilic PICC.  Tip at cavoatrial junction.  Length = 48 cm.  No immediate complication.

## 2011-03-29 ENCOUNTER — Other Ambulatory Visit (HOSPITAL_BASED_OUTPATIENT_CLINIC_OR_DEPARTMENT_OTHER): Payer: BC Managed Care – PPO | Admitting: Lab

## 2011-03-29 ENCOUNTER — Ambulatory Visit
Admission: RE | Admit: 2011-03-29 | Discharge: 2011-03-29 | Disposition: A | Discharge status: Home / Self Care | Payer: BC Managed Care – PPO | Source: Ambulatory Visit | Attending: Radiation Oncology | Admitting: Radiation Oncology

## 2011-03-29 ENCOUNTER — Ambulatory Visit (HOSPITAL_BASED_OUTPATIENT_CLINIC_OR_DEPARTMENT_OTHER): Payer: BC Managed Care – PPO

## 2011-03-29 VITALS — BP 122/73 | HR 56 | Temp 97.7°F

## 2011-03-29 DIAGNOSIS — C210 Malignant neoplasm of anus, unspecified: Secondary | ICD-10-CM

## 2011-03-29 DIAGNOSIS — Z5111 Encounter for antineoplastic chemotherapy: Secondary | ICD-10-CM

## 2011-03-29 LAB — COMPREHENSIVE METABOLIC PANEL
Alkaline Phosphatase: 74 U/L (ref 39–117)
CO2: 28 mEq/L (ref 19–32)
Creatinine, Ser: 0.9 mg/dL (ref 0.50–1.35)
Glucose, Bld: 110 mg/dL — ABNORMAL HIGH (ref 70–99)
Sodium: 137 mEq/L (ref 135–145)
Total Bilirubin: 0.3 mg/dL (ref 0.3–1.2)
Total Protein: 7.2 g/dL (ref 6.0–8.3)

## 2011-03-29 LAB — CBC WITH DIFFERENTIAL/PLATELET
BASO%: 0.5 % (ref 0.0–2.0)
Eosinophils Absolute: 0 10*3/uL (ref 0.0–0.5)
HCT: 35 % — ABNORMAL LOW (ref 38.4–49.9)
LYMPH%: 26.1 % (ref 14.0–49.0)
MCHC: 35.4 g/dL (ref 32.0–36.0)
MCV: 106.7 fL — ABNORMAL HIGH (ref 79.3–98.0)
MONO#: 1.2 10*3/uL — ABNORMAL HIGH (ref 0.1–0.9)
MONO%: 14.5 % — ABNORMAL HIGH (ref 0.0–14.0)
NEUT%: 58.4 % (ref 39.0–75.0)
Platelets: 218 10*3/uL (ref 140–400)
RBC: 3.28 10*6/uL — ABNORMAL LOW (ref 4.20–5.82)

## 2011-03-29 MED ORDER — MITOMYCIN CHEMO IV INJECTION 20 MG
20.0000 mg | Freq: Once | INTRAVENOUS | Status: AC
  Administered 2011-03-29: 20 mg via INTRAVENOUS
  Filled 2011-03-29: qty 40

## 2011-03-29 MED ORDER — DEXAMETHASONE SODIUM PHOSPHATE 10 MG/ML IJ SOLN
10.0000 mg | Freq: Once | INTRAMUSCULAR | Status: AC
  Administered 2011-03-29: 10 mg via INTRAVENOUS

## 2011-03-29 MED ORDER — MITOMYCIN CHEMO IV INJECTION 40 MG
10.0000 mg/m2 | Freq: Once | INTRAVENOUS | Status: DC
Start: 2011-03-29 — End: 2011-03-29
  Filled 2011-03-29: qty 43

## 2011-03-29 MED ORDER — FLUOROURACIL CHEMO INJECTION 5 GM/100ML
1000.0000 mg/m2/d | INTRAVENOUS | Status: DC
  Administered 2011-03-29: 8700 mg via INTRAVENOUS
  Filled 2011-03-29: qty 174

## 2011-03-29 MED ORDER — ONDANSETRON 8 MG/50ML IVPB (CHCC)
8.0000 mg | Freq: Once | INTRAVENOUS | Status: AC
  Administered 2011-03-29: 8 mg via INTRAVENOUS

## 2011-03-29 MED ORDER — SODIUM CHLORIDE 0.9 % IV SOLN
Freq: Once | INTRAVENOUS | Status: AC
  Administered 2011-03-29: 10:00:00 via INTRAVENOUS

## 2011-03-29 NOTE — Progress Notes (Signed)
1310 Pt education done regarding chemo spill kit, Medication infusing, pump instructions.  Pt verbalized understanding of all instructions and acknowledged # to call if any problems.

## 2011-03-30 ENCOUNTER — Ambulatory Visit (HOSPITAL_BASED_OUTPATIENT_CLINIC_OR_DEPARTMENT_OTHER): Payer: BC Managed Care – PPO | Admitting: Hematology and Oncology

## 2011-03-30 ENCOUNTER — Telehealth: Payer: Self-pay | Admitting: *Deleted

## 2011-03-30 ENCOUNTER — Ambulatory Visit
Admission: RE | Admit: 2011-03-30 | Discharge: 2011-03-30 | Disposition: A | Discharge status: Home / Self Care | Payer: BC Managed Care – PPO | Source: Ambulatory Visit | Attending: Radiation Oncology | Admitting: Radiation Oncology

## 2011-03-30 ENCOUNTER — Other Ambulatory Visit: Payer: BC Managed Care – PPO | Admitting: Lab

## 2011-03-30 ENCOUNTER — Encounter: Payer: Self-pay | Admitting: Hematology and Oncology

## 2011-03-30 ENCOUNTER — Ambulatory Visit (INDEPENDENT_AMBULATORY_CARE_PROVIDER_SITE_OTHER): Payer: BC Managed Care – PPO | Admitting: Internal Medicine

## 2011-03-30 ENCOUNTER — Encounter: Payer: Self-pay | Admitting: Internal Medicine

## 2011-03-30 VITALS — BP 120/73 | HR 67 | Temp 96.8°F | Ht 74.0 in | Wt 199.2 lb

## 2011-03-30 VITALS — BP 121/76 | HR 63 | Temp 97.5°F | Ht 74.0 in | Wt 199.8 lb

## 2011-03-30 DIAGNOSIS — C210 Malignant neoplasm of anus, unspecified: Secondary | ICD-10-CM

## 2011-03-30 DIAGNOSIS — B20 Human immunodeficiency virus [HIV] disease: Secondary | ICD-10-CM

## 2011-03-30 LAB — COMPREHENSIVE METABOLIC PANEL
Albumin: 3.8 g/dL (ref 3.5–5.2)
BUN: 18 mg/dL (ref 6–23)
CO2: 29 mEq/L (ref 19–32)
Calcium: 9.2 mg/dL (ref 8.4–10.5)
Glucose, Bld: 111 mg/dL — ABNORMAL HIGH (ref 70–99)
Potassium: 3.9 mEq/L (ref 3.5–5.3)
Sodium: 136 mEq/L (ref 135–145)
Total Protein: 8.3 g/dL (ref 6.0–8.3)

## 2011-03-30 MED ORDER — ATOVAQUONE 750 MG/5ML PO SUSP: 750.0000 mg | Freq: Two times a day (BID) | ORAL | Status: AC

## 2011-03-30 NOTE — Progress Notes (Signed)
Patient ID: Matthew Cortez, male   DOB: [birth year], 51 y.o.   MRN: [id number]  INFECTIOUS DISEASE PROGRESS NOTE    Subjective: Matthew Cortez is in for his routine visit. He started chemotherapy yesterday and is tolerating it well so far. He is feeling much better and Vicodin has greatly diminished his rectal pain. He is currently not having any pain any estimates that over the past week he's needed about 4 Vicodin daily to keep his pain under control. He has not had any problem with nausea or vomiting and has not missed any of his HIV medications since his past visit. He has not had any problems with the switch to Prezista other than noting that it is more expensive than Reyataz.  Objective:    General: He is in good spirits Skin: His left arm IV site appears normal Lungs: Clear Cor: Regular S1 and S2 no murmurs Abdomen: Soft and nontender   Lab Results HIV 1 RNA Quant (copies/mL)  Date Value  03/02/2011 440*  05/05/2010 106*  01/12/2010 456*     CD4 T Cell Abs (cmm)  Date Value  03/02/2011 60*  05/05/2010 30*  01/12/2010 20*     Assessment: Matthew Cortez's pain is under much better control. 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 There is a potential drug drug interaction between dapsone and 5 FU so I will change the dapsone to atovaquone while he is receiving chemotherapy.   Plan: 1. Continue current antiretroviral regimen  2.  change dapsone to atovaquone  3.  return to clinic after lab work in 6 weeks    Cliffton Asters, MD [facility] [phone] pager   [phone] cell 03/30/2011, 10:06 AM

## 2011-03-30 NOTE — Patient Instructions (Signed)
Patient to follow up for chemotherapy and lab apt as scheduled   

## 2011-03-30 NOTE — Progress Notes (Signed)
CC:   Matthew Cortez, M.D. Matthew Pew, MD Matthew Sportsman, MD Matthew Cortez, M.D., Ph.D.  IDENTIFYING PATIENT:  The patient is a 51 year old man with anal cancer who presents for followup.  INTERVAL HISTORY:  Matthew Cortez began radiation with mitomycin and 5-FU on 03/29/2011.  He had a PICC line placed.  He is doing well thus far.  He has no issues or complaints at this time.  He is without nausea, vomiting.  He has good energy levels.  He plans to resume work next week.  MEDICATIONS:  Reviewed and updated.  ALLERGIES:  Doxycycline, sulfonamides, amoxicillin, nevirapine, penicillin.  Past medical history, family history, and social history unchanged.  REVIEW OF SYSTEMS:  Ten-point review of systems negative.  PHYSICAL EXAM:  General:  The patient is a well-appearing, well- nourished man in no distress.  Vitals:  Pulse 67, blood pressure 120/73, temperature 96.8, respirations 20, weight 199 pounds.  HEENT:  Head is atraumatic, normocephalic.  Sclerae anicteric.  Mouth moist.  Chest: Clear.  CVS:  Unremarkable.  Abdomen:  Soft, nontender.  Bowel sounds present.  Extremities:  No edema.  LAB DATA:  03/29/2011, white cell count 8, hemoglobin 12.4, hematocrit 35, platelets 218.  Sodium 137, potassium 4.2, chloride 101, CO2 28, BUN 16, creatinine 0.9, glucose 110.  Total bilirubin 0.3, alkaline phosphatase 74, AST 19, ALT 17, calcium 8.9.  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  He is currently being treated with mitomycin and 5-FU concurrent with radiation, beginning therapy on 03/29/2011.  He is also tolerating therapy very well.  He will be monitored closely with weekly CBCs.  He follows up in 3 weeks' time prior to re-initiation of mitomycin and 5-FU.    ______________________________ Laurice Record, M.D. LIO/MEDQ  D:  03/30/2011  T:  03/30/2011  Job:   [id number]

## 2011-03-30 NOTE — Progress Notes (Signed)
This office note has been dictated.

## 2011-03-30 NOTE — Telephone Encounter (Signed)
AWAITING PT.'S RETURN CALL.

## 2011-03-30 NOTE — Progress Notes (Signed)
Post sim ed completed, gave pt "Radiation and You" booklet. All questions answered. Pt has met w/dietician.

## 2011-03-31 ENCOUNTER — Ambulatory Visit
Admission: RE | Admit: 2011-03-31 | Discharge: 2011-03-31 | Disposition: A | Discharge status: Home / Self Care | Payer: BC Managed Care – PPO | Source: Ambulatory Visit | Attending: Radiation Oncology | Admitting: Radiation Oncology

## 2011-04-01 ENCOUNTER — Encounter: Payer: Self-pay | Admitting: Hematology and Oncology

## 2011-04-01 ENCOUNTER — Ambulatory Visit
Admission: RE | Admit: 2011-04-01 | Discharge: 2011-04-01 | Disposition: A | Discharge status: Home / Self Care | Payer: BC Managed Care – PPO | Source: Ambulatory Visit | Attending: Radiation Oncology | Admitting: Radiation Oncology

## 2011-04-01 ENCOUNTER — Encounter: Payer: Self-pay | Admitting: Radiation Oncology

## 2011-04-01 ENCOUNTER — Telehealth: Payer: Self-pay | Admitting: Internal Medicine

## 2011-04-01 DIAGNOSIS — C210 Malignant neoplasm of anus, unspecified: Secondary | ICD-10-CM

## 2011-04-01 NOTE — Telephone Encounter (Signed)
lmonvm for pt re appt for 3/12 - pt to get schedule when he comes in. Also mailed schedule. Per diane injs for 2/26, 2/27, 2/28 not needed due to pt in hosp.

## 2011-04-01 NOTE — Progress Notes (Signed)
Patient denied EPP financial assistance, for family of 1, income [phone]

## 2011-04-02 ENCOUNTER — Ambulatory Visit (HOSPITAL_BASED_OUTPATIENT_CLINIC_OR_DEPARTMENT_OTHER): Payer: BC Managed Care – PPO

## 2011-04-02 ENCOUNTER — Telehealth: Payer: Self-pay | Admitting: *Deleted

## 2011-04-02 ENCOUNTER — Ambulatory Visit
Admission: RE | Admit: 2011-04-02 | Discharge: 2011-04-02 | Disposition: A | Discharge status: Home / Self Care | Payer: BC Managed Care – PPO | Source: Ambulatory Visit | Attending: Radiation Oncology | Admitting: Radiation Oncology

## 2011-04-02 VITALS — BP 122/66 | HR 62 | Temp 97.5°F

## 2011-04-02 DIAGNOSIS — C210 Malignant neoplasm of anus, unspecified: Secondary | ICD-10-CM

## 2011-04-02 MED ORDER — HEPARIN SOD (PORK) LOCK FLUSH 100 UNIT/ML IV SOLN
250.0000 [IU] | Freq: Once | INTRAVENOUS | Status: DC | PRN
  Filled 2011-04-02: qty 5

## 2011-04-02 MED ORDER — SODIUM CHLORIDE 0.9 % IJ SOLN
10.0000 mL | INTRAMUSCULAR | Status: DC | PRN
  Administered 2011-04-02: 10 mL
  Filled 2011-04-02: qty 10

## 2011-04-02 NOTE — Progress Notes (Signed)
[facility] Radiation Oncology Weekly Treatment Note    Name: Matthew Cortez Date: 04/02/2011 MRN: [id number] DOB: 01-Feb-1961  Status: outpatient    Current dose: 720 cGy  Current fraction: 4  Planned dose: 5400 cGy  Planned fraction: 30   MEDICATIONS: Current Outpatient Prescriptions  Medication Sig Dispense Refill  . acetaminophen (TYLENOL) 325 MG tablet Take 650 mg by mouth every 6 (six) hours as needed.      Marland Kitchen atovaquone (MEPRON) 750 MG/5ML suspension Take 5 mLs (750 mg total) by mouth 2 (two) times daily.  210 mL  0  . azithromycin (ZITHROMAX) 600 MG tablet Take 2 tablets (1,200 mg total) by mouth every 7 (seven) days.  10 tablet  11  . Darunavir Ethanolate (PREZISTA) 800 MG TABS Take 1 tablet by mouth daily.  30 tablet  11  . emtricitabine-tenofovir (TRUVADA) 200-300 MG per tablet Take 1 tablet by mouth daily.  90 tablet  3  . fluconazole (DIFLUCAN) 100 MG tablet Take 100 mg by mouth once a week.        Marland Kitchen HYDROcodone-acetaminophen (VICODIN ES) 7.5-750 MG per tablet Take 1-2 tablets by mouth every 6 (six) hours as needed for pain.  120 tablet  0  . mitoMYcin (MUTAMYCIN) 20 MG chemo injection Inject into the vein once.      . prochlorperazine (COMPAZINE) 10 MG tablet Take 1 tablet (10 mg total) by mouth every 8 (eight) hours as needed. Gave pt original rx at visit today 03/16/11.  30 tablet  0  . ranitidine (ZANTAC) 150 MG capsule Take 450 mg by mouth 2 (two) times daily.       . REYATAZ 300 MG capsule       . ritonavir (NORVIR) 100 MG TABS Take 1 tablet (100 mg total) by mouth daily.  90 tablet  3  . senna-docusate (EQ SENNA-S) 8.6-50 MG per tablet Take 1 tablet by mouth daily.  30 tablet  11  . sodium chloride 0.9 % SOLN 150 mL with fluorouracil 5 GM/100ML SOLN 200 mg/m2/day Inject 200 mg/m2/day into the vein over 96 hr.      . sucralfate (CARAFATE) 1 G tablet       . zidovudine (RETROVIR) 300 MG tablet Take 1 tablet (300 mg total) by mouth 2 (two) times daily.  120  tablet  3     ALLERGIES: Doxycycline; Sulfonamide derivatives; Amoxicillin; Nevirapine; and Penicillins   LABORATORY DATA:  Lab Results  Component Value Date   WBC 8.0 03/29/2011   HGB 12.4* 03/29/2011   HCT 35.0* 03/29/2011   MCV 106.7* 03/29/2011   PLT 218 03/29/2011   Lab Results  Component Value Date   NA 136 03/30/2011   K 3.9 03/30/2011   CL 99 03/30/2011   CO2 29 03/30/2011   Lab Results  Component Value Date   ALT 20 03/30/2011   AST 18 03/30/2011   ALKPHOS 82 03/30/2011   BILITOT 1.3* 03/30/2011      NARRATIVE: Matthew Cortez was seen today for weekly treatment management. The chart was checked and MVCT images were reviewed. The patient is doing very well with his treatment. He has not had any problems so far. No skin change.  PHYSICAL EXAMINATION: weight is 198 lb 14.4 oz (90.22 kg). His oral temperature is 98.2 F (36.8 C). His blood pressure is 102/67 and his pulse is 63. His respiration is 18.        ASSESSMENT: Patient tolerating treatments well.    PLAN: Continue treatment  as planned.

## 2011-04-02 NOTE — Telephone Encounter (Signed)
lmonvm for pt re appt for 3/27 @ 3 pm. Per message from LO ok to schedule f/u for 3/27 @ 3:30 pm. Pt to get schedule when he comes in. Also due to picc line note being done until 3/29 tx will start 4/1 per LO. Also per kasie wkly lb order was suppose to be 3/25 not 3/5 but should now start 4/1 w/chemo instead of 3/25.  Schedule mailed.

## 2011-04-05 ENCOUNTER — Ambulatory Visit
Admission: RE | Admit: 2011-04-05 | Discharge: 2011-04-05 | Disposition: A | Discharge status: Home / Self Care | Payer: BC Managed Care – PPO | Source: Ambulatory Visit | Attending: Radiation Oncology | Admitting: Radiation Oncology

## 2011-04-06 ENCOUNTER — Ambulatory Visit
Admission: RE | Admit: 2011-04-06 | Discharge: 2011-04-06 | Disposition: A | Discharge status: Home / Self Care | Payer: BC Managed Care – PPO | Source: Ambulatory Visit | Attending: Radiation Oncology | Admitting: Radiation Oncology

## 2011-04-06 ENCOUNTER — Other Ambulatory Visit: Payer: BC Managed Care – PPO | Admitting: Lab

## 2011-04-07 ENCOUNTER — Ambulatory Visit
Admission: RE | Admit: 2011-04-07 | Discharge: 2011-04-07 | Disposition: A | Discharge status: Home / Self Care | Payer: BC Managed Care – PPO | Source: Ambulatory Visit | Attending: Radiation Oncology | Admitting: Radiation Oncology

## 2011-04-08 ENCOUNTER — Ambulatory Visit
Admission: RE | Admit: 2011-04-08 | Discharge: 2011-04-08 | Disposition: A | Discharge status: Home / Self Care | Payer: BC Managed Care – PPO | Source: Ambulatory Visit | Attending: Radiation Oncology | Admitting: Radiation Oncology

## 2011-04-08 VITALS — BP 117/75 | HR 58 | Temp 98.0°F | Wt 198.5 lb

## 2011-04-08 DIAGNOSIS — C210 Malignant neoplasm of anus, unspecified: Secondary | ICD-10-CM

## 2011-04-08 MED ORDER — BIAFINE EX EMUL
CUTANEOUS | Status: DC | PRN
  Administered 2011-04-08: 1 via TOPICAL

## 2011-04-08 NOTE — Progress Notes (Signed)
Here for routine weekly md visit for radiation treatment of anus.Feels like tumor is shrinking. Appetite ok but sore mouth prevents him from eating much.Weight maintained within 1b.Currently using biotene qid as well as Baking soda and warm water.Pain mostly on left outer aspect of tongue. Continues to use imodium ad for diarrhea.Will give sitz bath. Pain managed with hydrocodone 7.5 qid prn.

## 2011-04-08 NOTE — Progress Notes (Signed)
[facility] Radiation Oncology Weekly Treatment Note    Name: Matthew Cortez Date: 04/08/2011 MRN: [id number] DOB: 1960-06-01  Status: outpatient    Current dose: 16.2 gray  Current fraction: 9  Planned dose: 54 gray  Planned fraction: 30   MEDICATIONS: Current Outpatient Prescriptions  Medication Sig Dispense Refill  . acetaminophen (TYLENOL) 325 MG tablet Take 650 mg by mouth every 6 (six) hours as needed.      Marland Kitchen atovaquone (MEPRON) 750 MG/5ML suspension Take 5 mLs (750 mg total) by mouth 2 (two) times daily.  210 mL  0  . azithromycin (ZITHROMAX) 600 MG tablet Take 2 tablets (1,200 mg total) by mouth every 7 (seven) days.  10 tablet  11  . Darunavir Ethanolate (PREZISTA) 800 MG TABS Take 1 tablet by mouth daily.  30 tablet  11  . emtricitabine-tenofovir (TRUVADA) 200-300 MG per tablet Take 1 tablet by mouth daily.  90 tablet  3  . fluconazole (DIFLUCAN) 100 MG tablet Take 100 mg by mouth once a week.        Marland Kitchen HYDROcodone-acetaminophen (VICODIN ES) 7.5-750 MG per tablet Take 1-2 tablets by mouth every 6 (six) hours as needed for pain.  120 tablet  0  . mitoMYcin (MUTAMYCIN) 20 MG chemo injection Inject into the vein once.      . prochlorperazine (COMPAZINE) 10 MG tablet Take 1 tablet (10 mg total) by mouth every 8 (eight) hours as needed. Gave pt original rx at visit today 03/16/11.  30 tablet  0  . ranitidine (ZANTAC) 150 MG capsule Take 450 mg by mouth 2 (two) times daily.       . REYATAZ 300 MG capsule       . ritonavir (NORVIR) 100 MG TABS Take 1 tablet (100 mg total) by mouth daily.  90 tablet  3  . senna-docusate (EQ SENNA-S) 8.6-50 MG per tablet Take 1 tablet by mouth daily.  30 tablet  11  . sodium chloride 0.9 % SOLN 150 mL with fluorouracil 5 GM/100ML SOLN 200 mg/m2/day Inject 200 mg/m2/day into the vein over 96 hr.      . sucralfate (CARAFATE) 1 G tablet       . zidovudine (RETROVIR) 300 MG tablet Take 1 tablet (300 mg total) by mouth 2 (two) times daily.  120  tablet  3   Current Facility-Administered Medications  Medication Dose Route Frequency Provider Last Rate Last Dose  . topical emolient (BIAFINE) emulsion   Topical PRN Jonna Coup, MD   1 application at 04/08/11 [phone]     ALLERGIES: Doxycycline; Sulfonamide derivatives; Amoxicillin; Nevirapine; and Penicillins   LABORATORY DATA:  Lab Results  Component Value Date   WBC 8.0 03/29/2011   HGB 12.4* 03/29/2011   HCT 35.0* 03/29/2011   MCV 106.7* 03/29/2011   PLT 218 03/29/2011   Lab Results  Component Value Date   NA 136 03/30/2011   K 3.9 03/30/2011   CL 99 03/30/2011   CO2 29 03/30/2011   Lab Results  Component Value Date   ALT 20 03/30/2011   AST 18 03/30/2011   ALKPHOS 82 03/30/2011   BILITOT 1.3* 03/30/2011      NARRATIVE: Matthew Cortez was seen today for weekly treatment management. The chart was checked and MVCT images were reviewed. The patient is doing well with his treatment. He is using Imodium for diarrhea and this is working well. He feels that the tumor has shrunk to some degree. The patient does complain of some  irritation in the oral cavity  PHYSICAL EXAMINATION: weight is 198 lb 8 oz (90.039 kg). His temperature is 98 F (36.7 C). His blood pressure is 117/75 and his pulse is 58.     the patient has some mucositis most prominently along the left lateral aspect of the time   ASSESSMENT: Patient tolerating treatments well.    PLAN: Continue treatment as planned. The patient is doing well overall. He has a small area of irritation within the oral cavity and I suggested that he use Anbesol for this. He is to let us know if this is not sufficient.

## 2011-04-09 ENCOUNTER — Ambulatory Visit
Admission: RE | Admit: 2011-04-09 | Discharge: 2011-04-09 | Disposition: A | Discharge status: Home / Self Care | Payer: BC Managed Care – PPO | Source: Ambulatory Visit | Attending: Radiation Oncology | Admitting: Radiation Oncology

## 2011-04-12 ENCOUNTER — Ambulatory Visit
Admission: RE | Admit: 2011-04-12 | Discharge: 2011-04-12 | Disposition: A | Discharge status: Home / Self Care | Payer: BC Managed Care – PPO | Source: Ambulatory Visit | Attending: Radiation Oncology | Admitting: Radiation Oncology

## 2011-04-13 ENCOUNTER — Other Ambulatory Visit: Payer: BC Managed Care – PPO | Admitting: Lab

## 2011-04-13 ENCOUNTER — Other Ambulatory Visit: Payer: Self-pay | Admitting: *Deleted

## 2011-04-13 ENCOUNTER — Ambulatory Visit
Admission: RE | Admit: 2011-04-13 | Discharge: 2011-04-13 | Disposition: A | Discharge status: Home / Self Care | Payer: BC Managed Care – PPO | Source: Ambulatory Visit | Attending: Radiation Oncology | Admitting: Radiation Oncology

## 2011-04-13 DIAGNOSIS — C210 Malignant neoplasm of anus, unspecified: Secondary | ICD-10-CM

## 2011-04-13 MED ORDER — HYDROCODONE-ACETAMINOPHEN 7.5-750 MG PO TABS: 1.0000 | ORAL_TABLET | Freq: Four times a day (QID) | ORAL | Status: DC | PRN

## 2011-04-14 ENCOUNTER — Ambulatory Visit
Admission: RE | Admit: 2011-04-14 | Discharge: 2011-04-14 | Disposition: A | Discharge status: Home / Self Care | Payer: BC Managed Care – PPO | Source: Ambulatory Visit | Attending: Radiation Oncology | Admitting: Radiation Oncology

## 2011-04-15 ENCOUNTER — Ambulatory Visit
Admission: RE | Admit: 2011-04-15 | Discharge: 2011-04-15 | Disposition: A | Discharge status: Home / Self Care | Payer: BC Managed Care – PPO | Source: Ambulatory Visit | Attending: Radiation Oncology | Admitting: Radiation Oncology

## 2011-04-16 ENCOUNTER — Ambulatory Visit
Admission: RE | Admit: 2011-04-16 | Discharge: 2011-04-16 | Disposition: A | Discharge status: Home / Self Care | Payer: BC Managed Care – PPO | Source: Ambulatory Visit | Attending: Radiation Oncology | Admitting: Radiation Oncology

## 2011-04-16 ENCOUNTER — Encounter: Payer: Self-pay | Admitting: Radiation Oncology

## 2011-04-16 ENCOUNTER — Other Ambulatory Visit: Payer: Self-pay | Admitting: Licensed Clinical Social Worker

## 2011-04-16 VITALS — Wt 198.2 lb

## 2011-04-16 DIAGNOSIS — C210 Malignant neoplasm of anus, unspecified: Secondary | ICD-10-CM

## 2011-04-16 MED ORDER — HYDROCODONE-ACETAMINOPHEN 7.5-750 MG PO TABS: 1.0000 | ORAL_TABLET | Freq: Four times a day (QID) | ORAL | Status: DC | PRN

## 2011-04-16 NOTE — Progress Notes (Signed)
   Department of Radiation Oncology  Phone:  [phone] Fax:        [phone]  Weekly Treatment Note    Name: Matthew Cortez Date: 04/16/2011 MRN: [id number] DOB: [birth year]   Current dose: 27 gray  Current fraction: 15   MEDICATIONS: Current Outpatient Prescriptions  Medication Sig Dispense Refill  . acetaminophen (TYLENOL) 325 MG tablet Take 650 mg by mouth every 6 (six) hours as needed.      Marland Kitchen atovaquone (MEPRON) 750 MG/5ML suspension Take 5 mLs (750 mg total) by mouth 2 (two) times daily.  210 mL  0  . Darunavir Ethanolate (PREZISTA) 800 MG TABS Take 1 tablet by mouth daily.  30 tablet  11  . emtricitabine-tenofovir (TRUVADA) 200-300 MG per tablet Take 1 tablet by mouth daily.  90 tablet  3  . fluconazole (DIFLUCAN) 100 MG tablet Take 100 mg by mouth once a week.        . mitoMYcin (MUTAMYCIN) 20 MG chemo injection Inject into the vein once.      . prochlorperazine (COMPAZINE) 10 MG tablet Take 1 tablet (10 mg total) by mouth every 8 (eight) hours as needed. Gave pt original rx at visit today 03/16/11.  30 tablet  0  . ranitidine (ZANTAC) 150 MG capsule Take 450 mg by mouth 2 (two) times daily.       . REYATAZ 300 MG capsule       . ritonavir (NORVIR) 100 MG TABS Take 1 tablet (100 mg total) by mouth daily.  90 tablet  3  . senna-docusate (EQ SENNA-S) 8.6-50 MG per tablet Take 1 tablet by mouth daily.  30 tablet  11  . sodium chloride 0.9 % SOLN 150 mL with fluorouracil 5 GM/100ML SOLN 200 mg/m2/day Inject 200 mg/m2/day into the vein over 96 hr.      . sucralfate (CARAFATE) 1 G tablet       . zidovudine (RETROVIR) 300 MG tablet Take 1 tablet (300 mg total) by mouth 2 (two) times daily.  120 tablet  3  . HYDROcodone-acetaminophen (VICODIN ES) 7.5-750 MG per tablet Take 1-2 tablets by mouth every 6 (six) hours as needed for pain.  120 tablet  0     ALLERGIES: Doxycycline; Sulfonamide derivatives; Amoxicillin; Nevirapine; and Penicillins   LABORATORY DATA:  Lab Results    Component Value Date   WBC 8.0 03/29/2011   HGB 12.4* 03/29/2011   HCT 35.0* 03/29/2011   MCV 106.7* 03/29/2011   PLT 218 03/29/2011   Lab Results  Component Value Date   NA 136 03/30/2011   K 3.9 03/30/2011   CL 99 03/30/2011   CO2 29 03/30/2011   Lab Results  Component Value Date   ALT 20 03/30/2011   AST 18 03/30/2011   ALKPHOS 82 03/30/2011   BILITOT 1.3* 03/30/2011     NARRATIVE: Omair Dettmer was seen today for weekly treatment management. The chart was checked and the patient's films were reviewed. The patient is doing well. He has noted some increased soreness in the rectal area. He is using Biafine cream and sitz baths. Appetite is good.  PHYSICAL EXAMINATION: weight is 198 lb 3.2 oz (89.903 kg).        ASSESSMENT: The patient is doing satisfactorily with treatment.  PLAN: We will continue with the patient's radiation treatment as planned.

## 2011-04-16 NOTE — Progress Notes (Signed)
Pt reports increased discomfort, pain in anal area past 2 days. Hydrocodone gives 75% relief. Using baby wipes, sitz bath, Biafine cream. Appetite good, no fatigue.

## 2011-04-19 ENCOUNTER — Ambulatory Visit
Admission: RE | Admit: 2011-04-19 | Discharge: 2011-04-19 | Disposition: A | Discharge status: Home / Self Care | Payer: BC Managed Care – PPO | Source: Ambulatory Visit | Attending: Radiation Oncology | Admitting: Radiation Oncology

## 2011-04-20 ENCOUNTER — Other Ambulatory Visit: Payer: BC Managed Care – PPO | Admitting: Lab

## 2011-04-20 ENCOUNTER — Ambulatory Visit
Admission: RE | Admit: 2011-04-20 | Discharge: 2011-04-20 | Disposition: A | Discharge status: Home / Self Care | Payer: BC Managed Care – PPO | Source: Ambulatory Visit | Attending: Radiation Oncology | Admitting: Radiation Oncology

## 2011-04-21 ENCOUNTER — Ambulatory Visit
Admission: RE | Admit: 2011-04-21 | Discharge: 2011-04-21 | Disposition: A | Discharge status: Home / Self Care | Payer: BC Managed Care – PPO | Source: Ambulatory Visit | Attending: Radiation Oncology | Admitting: Radiation Oncology

## 2011-04-22 ENCOUNTER — Ambulatory Visit
Admission: RE | Admit: 2011-04-22 | Discharge: 2011-04-22 | Disposition: A | Discharge status: Home / Self Care | Payer: BC Managed Care – PPO | Source: Ambulatory Visit | Attending: Radiation Oncology | Admitting: Radiation Oncology

## 2011-04-22 ENCOUNTER — Encounter: Payer: Self-pay | Admitting: Radiation Oncology

## 2011-04-22 VITALS — Wt 197.4 lb

## 2011-04-22 DIAGNOSIS — C210 Malignant neoplasm of anus, unspecified: Secondary | ICD-10-CM

## 2011-04-22 MED ORDER — BIAFINE EX EMUL
Freq: Two times a day (BID) | CUTANEOUS | Status: DC
  Administered 2011-04-22: 09:00:00 via TOPICAL

## 2011-04-22 NOTE — Progress Notes (Signed)
Department of Radiation Oncology  Phone:  [phone] Fax:        [phone]  Weekly Treatment Note    Name: Matthew Cortez Date: 04/22/2011 MRN: [id number] DOB: 1960-11-10   Current dose: 34.2 gray  Current fraction: 19   MEDICATIONS: Current Outpatient Prescriptions  Medication Sig Dispense Refill  . acetaminophen (TYLENOL) 325 MG tablet Take 650 mg by mouth every 6 (six) hours as needed.      Marland Kitchen atovaquone (MEPRON) 750 MG/5ML suspension Take 5 mLs (750 mg total) by mouth 2 (two) times daily.  210 mL  0  . Darunavir Ethanolate (PREZISTA) 800 MG TABS Take 1 tablet by mouth daily.  30 tablet  11  . emtricitabine-tenofovir (TRUVADA) 200-300 MG per tablet Take 1 tablet by mouth daily.  90 tablet  3  . fluconazole (DIFLUCAN) 100 MG tablet Take 100 mg by mouth once a week.        Marland Kitchen HYDROcodone-acetaminophen (VICODIN ES) 7.5-750 MG per tablet Take 1-2 tablets by mouth every 6 (six) hours as needed for pain.  120 tablet  0  . mitoMYcin (MUTAMYCIN) 20 MG chemo injection Inject into the vein once.      . prochlorperazine (COMPAZINE) 10 MG tablet Take 1 tablet (10 mg total) by mouth every 8 (eight) hours as needed. Gave pt original rx at visit today 03/16/11.  30 tablet  0  . ranitidine (ZANTAC) 150 MG capsule Take 450 mg by mouth 2 (two) times daily.       . REYATAZ 300 MG capsule       . ritonavir (NORVIR) 100 MG TABS Take 1 tablet (100 mg total) by mouth daily.  90 tablet  3  . senna-docusate (EQ SENNA-S) 8.6-50 MG per tablet Take 1 tablet by mouth daily.  30 tablet  11  . sodium chloride 0.9 % SOLN 150 mL with fluorouracil 5 GM/100ML SOLN 200 mg/m2/day Inject 200 mg/m2/day into the vein over 96 hr.      . sucralfate (CARAFATE) 1 G tablet       . zidovudine (RETROVIR) 300 MG tablet Take 1 tablet (300 mg total) by mouth 2 (two) times daily.  120 tablet  3   Current Facility-Administered Medications  Medication Dose Route Frequency Provider Last Rate Last Dose  . topical emolient  (BIAFINE) emulsion   Topical BID Lurline Hare, MD         ALLERGIES: Doxycycline; Sulfonamide derivatives; Amoxicillin; Nevirapine; and Penicillins   LABORATORY DATA:  Lab Results  Component Value Date   WBC 8.0 03/29/2011   HGB 12.4* 03/29/2011   HCT 35.0* 03/29/2011   MCV 106.7* 03/29/2011   PLT 218 03/29/2011   Lab Results  Component Value Date   NA 136 03/30/2011   K 3.9 03/30/2011   CL 99 03/30/2011   CO2 29 03/30/2011   Lab Results  Component Value Date   ALT 20 03/30/2011   AST 18 03/30/2011   ALKPHOS 82 03/30/2011   BILITOT 1.3* 03/30/2011     NARRATIVE: Matthew Cortez was seen today for weekly treatment management. The chart was checked and the patient's films were reviewed. The patient is doing very well this week. He states that last week when he was seen was his worst day. Things have leveled off in terms of symptoms and his bowel movements are under control. He is taking Imodium twice a day.  PHYSICAL EXAMINATION: weight is 197 lb 6.4 oz (89.54 kg).      the patient's skin shows  some irritation and hyperpigmentation. Overall his skin looks good for where we are in his overall treatment  ASSESSMENT: The patient is doing satisfactorily with treatment.  PLAN: We will continue with the patient's radiation treatment as planned.

## 2011-04-22 NOTE — Progress Notes (Signed)
Pt reports occass dysuria. Taking Imodium 2 tabs daily. BM's normal. Biafine in tx area very helpful; pt states skin is intact.

## 2011-04-23 ENCOUNTER — Ambulatory Visit
Admission: RE | Admit: 2011-04-23 | Discharge: 2011-04-23 | Disposition: A | Discharge status: Home / Self Care | Payer: BC Managed Care – PPO | Source: Ambulatory Visit | Attending: Radiation Oncology | Admitting: Radiation Oncology

## 2011-04-26 ENCOUNTER — Ambulatory Visit
Admission: RE | Admit: 2011-04-26 | Discharge: 2011-04-26 | Disposition: A | Discharge status: Home / Self Care | Payer: BC Managed Care – PPO | Source: Ambulatory Visit | Attending: Radiation Oncology | Admitting: Radiation Oncology

## 2011-04-27 ENCOUNTER — Ambulatory Visit
Admission: RE | Admit: 2011-04-27 | Discharge: 2011-04-27 | Disposition: A | Discharge status: Home / Self Care | Payer: BC Managed Care – PPO | Source: Ambulatory Visit | Attending: Radiation Oncology | Admitting: Radiation Oncology

## 2011-04-27 ENCOUNTER — Other Ambulatory Visit: Payer: BC Managed Care – PPO | Admitting: Lab

## 2011-04-28 ENCOUNTER — Ambulatory Visit (HOSPITAL_BASED_OUTPATIENT_CLINIC_OR_DEPARTMENT_OTHER): Payer: BC Managed Care – PPO | Admitting: Hematology and Oncology

## 2011-04-28 ENCOUNTER — Telehealth: Payer: Self-pay | Admitting: Hematology and Oncology

## 2011-04-28 ENCOUNTER — Ambulatory Visit
Admission: RE | Admit: 2011-04-28 | Discharge: 2011-04-28 | Disposition: A | Discharge status: Home / Self Care | Payer: BC Managed Care – PPO | Source: Ambulatory Visit | Attending: Radiation Oncology | Admitting: Radiation Oncology

## 2011-04-28 ENCOUNTER — Other Ambulatory Visit: Payer: BC Managed Care – PPO

## 2011-04-28 ENCOUNTER — Encounter: Payer: Self-pay | Admitting: Hematology and Oncology

## 2011-04-28 VITALS — BP 115/66 | HR 58 | Temp 97.1°F | Ht 74.0 in | Wt 197.3 lb

## 2011-04-28 DIAGNOSIS — Z21 Asymptomatic human immunodeficiency virus [HIV] infection status: Secondary | ICD-10-CM

## 2011-04-28 DIAGNOSIS — C210 Malignant neoplasm of anus, unspecified: Secondary | ICD-10-CM

## 2011-04-28 LAB — CBC WITH DIFFERENTIAL/PLATELET
Basophils Absolute: 0 10*3/uL (ref 0.0–0.1)
Eosinophils Absolute: 0 10*3/uL (ref 0.0–0.5)
HGB: 10.8 g/dL — ABNORMAL LOW (ref 13.0–17.1)
MCV: 109.9 fL — ABNORMAL HIGH (ref 79.3–98.0)
MONO#: 0.4 10*3/uL (ref 0.1–0.9)
MONO%: 14.8 % — ABNORMAL HIGH (ref 0.0–14.0)
NEUT#: 1.9 10*3/uL (ref 1.5–6.5)
RBC: 2.88 10*6/uL — ABNORMAL LOW (ref 4.20–5.82)
RDW: 15.8 % — ABNORMAL HIGH (ref 11.0–14.6)
WBC: 2.8 10*3/uL — ABNORMAL LOW (ref 4.0–10.3)
lymph#: 0.5 10*3/uL — ABNORMAL LOW (ref 0.9–3.3)

## 2011-04-28 LAB — COMPREHENSIVE METABOLIC PANEL
Albumin: 3.8 g/dL (ref 3.5–5.2)
Alkaline Phosphatase: 71 U/L (ref 39–117)
Calcium: 8.9 mg/dL (ref 8.4–10.5)
Chloride: 102 mEq/L (ref 96–112)
Glucose, Bld: 90 mg/dL (ref 70–99)
Potassium: 3.9 mEq/L (ref 3.5–5.3)
Sodium: 139 mEq/L (ref 135–145)
Total Protein: 7.7 g/dL (ref 6.0–8.3)

## 2011-04-28 NOTE — Progress Notes (Signed)
CC:   Matthew Cortez, M.D. Ardeth Sportsman, MD Radene Gunning, M.D., Ph.D. [name], MD  IDENTIFYING STATEMENT:  Patient is a 51 year old man with anal cancer who presents for followup.  INTERIM HISTORY:  Matthew Cortez continues radiation and notes rectal discomfort.  Notes minimal fatigue but is still able to work.  He does not have nausea, vomiting or rectal bleeding.  He denies mucositis and in general, denies fever or chills.  MEDICATIONS:  Reviewed and updated.  ALLERGIES:  Doxycycline, sulfonamides, amoxicillin, nevirapine, penicillin.  PAST MEDICAL HISTORY/FAMILY HISTORY/SOCIAL HISTORY:  Unchanged.  REVIEW OF SYSTEMS:  Besides rectal discomfort, which he rates at 2/10 and for which he is using sitz baths, 10 point review is negative.  PHYSICAL EXAM:  The patient is a well-appearing, well-nourished, man in no distress.  Vitals:  Pulse 58, blood pressure 115/66, temperature 97.1, respirations 20, weight 197 pounds.  HEENT:  Head is atraumatic, normocephalic.  The sclerae anicteric.  Mouth moist.  Chest:  Clear. CVS:  Unremarkable.  Abdomen:  Soft, nontender.  Bowel sounds present. Extremities:  No edema or calf tenderness.  LABORATORY DATA:  04/28/2011: White cell count 2.8, hemoglobin 10.8, hematocrit 31.6, platelets 193.  ANC 1900.  CMET pending.  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  He is currently receiving radiation concurrent with mitomycin and 5-FU, beginning therapy on 03/29/2011.  He receives his last week of chemotherapy and after that he will have weekly CBCs.  He follows up in 5 weeks' time.    ______________________________ Laurice Record, M.D. LIO/MEDQ  D:  04/28/2011  T:  04/28/2011  Job:  [id number]

## 2011-04-28 NOTE — Progress Notes (Signed)
This office note has been dictated.

## 2011-04-28 NOTE — Telephone Encounter (Signed)
appts made and printed for pt aom °

## 2011-04-29 ENCOUNTER — Ambulatory Visit
Admission: RE | Admit: 2011-04-29 | Discharge: 2011-04-29 | Disposition: A | Discharge status: Home / Self Care | Payer: BC Managed Care – PPO | Source: Ambulatory Visit | Attending: Radiation Oncology | Admitting: Radiation Oncology

## 2011-04-29 ENCOUNTER — Telehealth (HOSPITAL_COMMUNITY): Payer: Self-pay | Admitting: Hematology and Oncology

## 2011-04-29 ENCOUNTER — Encounter: Payer: Self-pay | Admitting: Radiation Oncology

## 2011-04-29 VITALS — Wt 196.6 lb

## 2011-04-29 DIAGNOSIS — C210 Malignant neoplasm of anus, unspecified: Secondary | ICD-10-CM

## 2011-04-29 NOTE — Progress Notes (Signed)
Department of Radiation Oncology  Phone:  [phone] Fax:        [phone]  Weekly Treatment Note    Name: Matthew Cortez Date: 04/29/2011 MRN: [id number] DOB: 29-Dec-1960   Current dose: 43.2 gray  Current fraction: 24   MEDICATIONS: Current Outpatient Prescriptions  Medication Sig Dispense Refill  . acetaminophen (TYLENOL) 325 MG tablet Take 650 mg by mouth every 6 (six) hours as needed.      Marland Kitchen atovaquone (MEPRON) 750 MG/5ML suspension Take 5 mLs (750 mg total) by mouth 2 (two) times daily.  210 mL  0  . Darunavir Ethanolate (PREZISTA) 800 MG TABS Take 1 tablet by mouth daily.  30 tablet  11  . emtricitabine-tenofovir (TRUVADA) 200-300 MG per tablet Take 1 tablet by mouth daily.  90 tablet  3  . fluconazole (DIFLUCAN) 100 MG tablet Take 100 mg by mouth once a week.        Marland Kitchen HYDROcodone-acetaminophen (VICODIN ES) 7.5-750 MG per tablet Take 1-2 tablets by mouth every 6 (six) hours as needed for pain.  120 tablet  0  . mitoMYcin (MUTAMYCIN) 20 MG chemo injection Inject into the vein once.      . prochlorperazine (COMPAZINE) 10 MG tablet Take 1 tablet (10 mg total) by mouth every 8 (eight) hours as needed. Gave pt original rx at visit today 03/16/11.  30 tablet  0  . ranitidine (ZANTAC) 150 MG capsule Take 450 mg by mouth 2 (two) times daily.       . REYATAZ 300 MG capsule       . ritonavir (NORVIR) 100 MG TABS Take 1 tablet (100 mg total) by mouth daily.  90 tablet  3  . sodium chloride 0.9 % SOLN 150 mL with fluorouracil 5 GM/100ML SOLN 200 mg/m2/day Inject 200 mg/m2/day into the vein over 96 hr.      . sucralfate (CARAFATE) 1 G tablet       . zidovudine (RETROVIR) 300 MG tablet Take 1 tablet (300 mg total) by mouth 2 (two) times daily.  120 tablet  3     ALLERGIES: Doxycycline; Sulfonamide derivatives; Amoxicillin; Nevirapine; and Penicillins   LABORATORY DATA:  Lab Results  Component Value Date   WBC 2.8* 04/28/2011   HGB 10.8* 04/28/2011   HCT 31.6* 04/28/2011   MCV  109.9* 04/28/2011   PLT 193 04/28/2011   Lab Results  Component Value Date   NA 139 04/28/2011   K 3.9 04/28/2011   CL 102 04/28/2011   CO2 30 04/28/2011   Lab Results  Component Value Date   ALT 35 04/28/2011   AST 24 04/28/2011   ALKPHOS 71 04/28/2011   BILITOT 1.9* 04/28/2011     NARRATIVE: Matthew Cortez was seen today for weekly treatment management. The chart was checked and the patient's films were reviewed. The patient is doing well today. No major changes. He is continuing to experience some irritation in the anal region. He is using sitz baths and Biafine lotion. The patient denies any significant GI issues and no dysuria.  PHYSICAL EXAMINATION: weight is 196 lb 9.6 oz (89.177 kg).      patient has hyperpigmentation and some moistness in the anal region. A pleased with the limited amount of significant skin irritation. The patient is not having any significant problems in the genital region or the inguinal regions.  ASSESSMENT: The patient is doing satisfactorily with treatment.  PLAN: We will continue with the patient's radiation treatment as planned.

## 2011-04-29 NOTE — Progress Notes (Signed)
Pt states he is feeling well this week. Slight fatigue at times but cont to work full-time. No issues w/bowels or bladder. Skin in tx area "sunburned", sitz baths prn, Biafine lotion.

## 2011-04-30 ENCOUNTER — Other Ambulatory Visit: Payer: Self-pay | Admitting: Hematology and Oncology

## 2011-04-30 ENCOUNTER — Ambulatory Visit
Admission: RE | Admit: 2011-04-30 | Discharge: 2011-04-30 | Disposition: A | Discharge status: Home / Self Care | Payer: BC Managed Care – PPO | Source: Ambulatory Visit | Attending: Radiation Oncology | Admitting: Radiation Oncology

## 2011-04-30 ENCOUNTER — Ambulatory Visit (HOSPITAL_COMMUNITY)
Admission: RE | Admit: 2011-04-30 | Discharge: 2011-04-30 | Disposition: A | Discharge status: Home / Self Care | Payer: BC Managed Care – PPO | Source: Ambulatory Visit | Attending: Hematology and Oncology | Admitting: Hematology and Oncology

## 2011-04-30 DIAGNOSIS — C210 Malignant neoplasm of anus, unspecified: Secondary | ICD-10-CM

## 2011-04-30 DIAGNOSIS — C20 Malignant neoplasm of rectum: Secondary | ICD-10-CM | POA: Insufficient documentation

## 2011-04-30 MED ORDER — LIDOCAINE HCL 1 % IJ SOLN
INTRAMUSCULAR | Status: AC
  Filled 2011-04-30: qty 20

## 2011-04-30 NOTE — Procedures (Signed)
US/ fluoroscopic guided left DL basilic vein PICC placed. Length 51 cm. Tip SVC/RA junction. No immediate complications.

## 2011-05-03 ENCOUNTER — Ambulatory Visit
Admission: RE | Admit: 2011-05-03 | Discharge: 2011-05-03 | Disposition: A | Discharge status: Home / Self Care | Payer: BC Managed Care – PPO | Source: Ambulatory Visit | Attending: Radiation Oncology | Admitting: Radiation Oncology

## 2011-05-03 ENCOUNTER — Ambulatory Visit (HOSPITAL_BASED_OUTPATIENT_CLINIC_OR_DEPARTMENT_OTHER): Payer: BC Managed Care – PPO

## 2011-05-03 ENCOUNTER — Other Ambulatory Visit: Payer: BC Managed Care – PPO | Admitting: Lab

## 2011-05-03 ENCOUNTER — Other Ambulatory Visit: Payer: Self-pay | Admitting: *Deleted

## 2011-05-03 VITALS — BP 112/67 | HR 63 | Temp 97.0°F

## 2011-05-03 DIAGNOSIS — C210 Malignant neoplasm of anus, unspecified: Secondary | ICD-10-CM

## 2011-05-03 DIAGNOSIS — Z5111 Encounter for antineoplastic chemotherapy: Secondary | ICD-10-CM

## 2011-05-03 LAB — CBC WITH DIFFERENTIAL/PLATELET
BASO%: 0.4 % (ref 0.0–2.0)
HCT: 33.2 % — ABNORMAL LOW (ref 38.4–49.9)
LYMPH%: 22.1 % (ref 14.0–49.0)
MCH: 36.6 pg — ABNORMAL HIGH (ref 27.2–33.4)
MCHC: 34.9 g/dL (ref 32.0–36.0)
MCV: 104.7 fL — ABNORMAL HIGH (ref 79.3–98.0)
MONO#: 0.7 10*3/uL (ref 0.1–0.9)
MONO%: 24.6 % — ABNORMAL HIGH (ref 0.0–14.0)
NEUT%: 50.3 % (ref 39.0–75.0)
Platelets: 213 10*3/uL (ref 140–400)
WBC: 2.7 10*3/uL — ABNORMAL LOW (ref 4.0–10.3)

## 2011-05-03 MED ORDER — DEXAMETHASONE SODIUM PHOSPHATE 10 MG/ML IJ SOLN
10.0000 mg | Freq: Once | INTRAMUSCULAR | Status: AC
  Administered 2011-05-03: 10 mg via INTRAVENOUS

## 2011-05-03 MED ORDER — SODIUM CHLORIDE 0.9 % IJ SOLN
10.0000 mL | INTRAMUSCULAR | Status: DC | PRN
  Filled 2011-05-03: qty 10

## 2011-05-03 MED ORDER — ONDANSETRON 8 MG/50ML IVPB (CHCC)
8.0000 mg | Freq: Once | INTRAVENOUS | Status: AC
  Administered 2011-05-03: 8 mg via INTRAVENOUS

## 2011-05-03 MED ORDER — MITOMYCIN CHEMO IV INJECTION 20 MG
20.0000 mg | Freq: Once | INTRAVENOUS | Status: AC
  Administered 2011-05-03: 20 mg via INTRAVENOUS
  Filled 2011-05-03: qty 40

## 2011-05-03 MED ORDER — HEPARIN SOD (PORK) LOCK FLUSH 100 UNIT/ML IV SOLN
250.0000 [IU] | Freq: Once | INTRAVENOUS | Status: DC | PRN
  Filled 2011-05-03: qty 5

## 2011-05-03 MED ORDER — FLUOROURACIL CHEMO INJECTION 5 GM/100ML
1000.0000 mg/m2/d | INTRAVENOUS | Status: DC
  Administered 2011-05-03: 8700 mg via INTRAVENOUS
  Filled 2011-05-03: qty 174

## 2011-05-03 MED ORDER — SODIUM CHLORIDE 0.9 % IV SOLN
Freq: Once | INTRAVENOUS | Status: AC
  Administered 2011-05-03: 10:00:00 via INTRAVENOUS

## 2011-05-04 ENCOUNTER — Ambulatory Visit
Admission: RE | Admit: 2011-05-04 | Discharge: 2011-05-04 | Disposition: A | Discharge status: Home / Self Care | Payer: BC Managed Care – PPO | Source: Ambulatory Visit | Attending: Radiation Oncology | Admitting: Radiation Oncology

## 2011-05-04 ENCOUNTER — Other Ambulatory Visit: Payer: BC Managed Care – PPO | Admitting: Lab

## 2011-05-05 ENCOUNTER — Ambulatory Visit: Payer: BC Managed Care – PPO

## 2011-05-05 ENCOUNTER — Other Ambulatory Visit: Payer: BC Managed Care – PPO | Admitting: Lab

## 2011-05-05 ENCOUNTER — Ambulatory Visit
Admission: RE | Admit: 2011-05-05 | Discharge: 2011-05-05 | Disposition: A | Discharge status: Home / Self Care | Payer: BC Managed Care – PPO | Source: Ambulatory Visit | Attending: Radiation Oncology | Admitting: Radiation Oncology

## 2011-05-05 DIAGNOSIS — C211 Malignant neoplasm of anal canal: Secondary | ICD-10-CM | POA: Insufficient documentation

## 2011-05-06 ENCOUNTER — Encounter: Payer: Self-pay | Admitting: Radiation Oncology

## 2011-05-06 ENCOUNTER — Ambulatory Visit
Admission: RE | Admit: 2011-05-06 | Discharge: 2011-05-06 | Disposition: A | Discharge status: Home / Self Care | Payer: BC Managed Care – PPO | Source: Ambulatory Visit | Attending: Radiation Oncology | Admitting: Radiation Oncology

## 2011-05-06 VITALS — Wt 193.5 lb

## 2011-05-06 DIAGNOSIS — C210 Malignant neoplasm of anus, unspecified: Secondary | ICD-10-CM

## 2011-05-06 NOTE — Progress Notes (Signed)
Weekly Management Note Current Dose:52.2 Gy  Projected Dose:54 Gy   Narrative:  The patient presents for routine under treatment assessment.  CBCT/MVCT images/Port film x-rays were reviewed.  The chart was checked. Doing remarkably well. Taking hydrocodone only at night. Minimal diarrhea. Some fatigue but still working. Using biafene on perianal skin  Physical Findings:  Alert. No distress  Vitals: There were no vitals filed for this visit. Weight:  Wt Readings from Last 3 Encounters:  05/06/11 193 lb 8 oz (87.771 kg)  04/29/11 196 lb 9.6 oz (89.177 kg)  04/28/11 197 lb 4.8 oz (89.495 kg)   Lab Results  Component Value Date   WBC 2.7* 05/03/2011   HGB 11.6* 05/03/2011   HCT 33.2* 05/03/2011   MCV 104.7* 05/03/2011   PLT 213 05/03/2011   Lab Results  Component Value Date   CREATININE 0.89 04/28/2011   BUN 13 04/28/2011   NA 139 04/28/2011   K 3.9 04/28/2011   CL 102 04/28/2011   CO2 30 04/28/2011     Impression:  The patient is tolerating radiation.  Plan:  Continue treatment as planned. F/u in 1 month. Call with worsening symptoms before then.

## 2011-05-06 NOTE — Progress Notes (Signed)
On chemo since Monday, diarrhea x 2 days. Taking Imodium 2 tabs/daily. 3 small bm's, 2 larger bms in past 24 hrs. States he has noticed slight soreness in mouth. Using Biotene. Taking Hydrocodone at night to rest, usually not needed during day for rectal soreness. Sitz baths w/relief.  Pt completes xrt tomorrow; has FU card.

## 2011-05-07 ENCOUNTER — Ambulatory Visit
Admission: RE | Admit: 2011-05-07 | Discharge: 2011-05-07 | Disposition: A | Discharge status: Home / Self Care | Payer: BC Managed Care – PPO | Source: Ambulatory Visit | Attending: Radiation Oncology | Admitting: Radiation Oncology

## 2011-05-07 ENCOUNTER — Ambulatory Visit (HOSPITAL_BASED_OUTPATIENT_CLINIC_OR_DEPARTMENT_OTHER): Payer: BC Managed Care – PPO

## 2011-05-07 ENCOUNTER — Other Ambulatory Visit (HOSPITAL_BASED_OUTPATIENT_CLINIC_OR_DEPARTMENT_OTHER): Payer: BC Managed Care – PPO | Admitting: Lab

## 2011-05-07 DIAGNOSIS — C210 Malignant neoplasm of anus, unspecified: Secondary | ICD-10-CM

## 2011-05-07 DIAGNOSIS — Z452 Encounter for adjustment and management of vascular access device: Secondary | ICD-10-CM

## 2011-05-07 LAB — CBC WITH DIFFERENTIAL/PLATELET
Basophils Absolute: 0 10*3/uL (ref 0.0–0.1)
EOS%: 1.6 % (ref 0.0–7.0)
HGB: 12.2 g/dL — ABNORMAL LOW (ref 13.0–17.1)
MCH: 37.1 pg — ABNORMAL HIGH (ref 27.2–33.4)
MCHC: 35.6 g/dL (ref 32.0–36.0)
MCV: 104.3 fL — ABNORMAL HIGH (ref 79.3–98.0)
MONO%: 13.5 % (ref 0.0–14.0)
NEUT%: 75 % (ref 39.0–75.0)
RDW: 14.4 % (ref 11.0–14.6)

## 2011-05-07 NOTE — Patient Instructions (Signed)
PICC line removed, pt instructed to keep dressing intact for 24 hours.

## 2011-05-07 NOTE — Progress Notes (Signed)
PICC line D/Cd. Occlusive dressing in place. Pt tolerated well. Catheter intact.

## 2011-05-10 ENCOUNTER — Other Ambulatory Visit (HOSPITAL_BASED_OUTPATIENT_CLINIC_OR_DEPARTMENT_OTHER): Payer: BC Managed Care – PPO | Admitting: Lab

## 2011-05-10 ENCOUNTER — Ambulatory Visit: Payer: BC Managed Care – PPO

## 2011-05-10 DIAGNOSIS — C210 Malignant neoplasm of anus, unspecified: Secondary | ICD-10-CM

## 2011-05-10 LAB — CBC WITH DIFFERENTIAL/PLATELET
EOS%: 1.9 % (ref 0.0–7.0)
MCH: 38.6 pg — ABNORMAL HIGH (ref 27.2–33.4)
MCV: 111 fL — ABNORMAL HIGH (ref 79.3–98.0)
MONO%: 14.1 % — ABNORMAL HIGH (ref 0.0–14.0)
NEUT#: 2.1 10*3/uL (ref 1.5–6.5)
RBC: 2.88 10*6/uL — ABNORMAL LOW (ref 4.20–5.82)
RDW: 15.3 % — ABNORMAL HIGH (ref 11.0–14.6)
nRBC: 0 % (ref 0–0)

## 2011-05-17 ENCOUNTER — Other Ambulatory Visit: Payer: BC Managed Care – PPO | Admitting: Lab

## 2011-05-18 ENCOUNTER — Other Ambulatory Visit: Payer: BC Managed Care – PPO

## 2011-05-18 ENCOUNTER — Other Ambulatory Visit: Payer: Self-pay | Admitting: Internal Medicine

## 2011-05-18 DIAGNOSIS — B20 Human immunodeficiency virus [HIV] disease: Secondary | ICD-10-CM

## 2011-05-18 LAB — COMPREHENSIVE METABOLIC PANEL
ALT: 20 U/L (ref 0–53)
AST: 24 U/L (ref 0–37)
Albumin: 3.8 g/dL (ref 3.5–5.2)
Calcium: 8.4 mg/dL (ref 8.4–10.5)
Chloride: 103 mEq/L (ref 96–112)
Creat: 0.84 mg/dL (ref 0.50–1.35)
Potassium: 4.2 mEq/L (ref 3.5–5.3)

## 2011-05-18 LAB — CBC
Platelets: 80 10*3/uL — ABNORMAL LOW (ref 150–400)
RDW: 14.8 % (ref 11.5–15.5)
WBC: 2.2 10*3/uL — ABNORMAL LOW (ref 4.0–10.5)

## 2011-05-19 LAB — T-HELPER CELL (CD4) - (RCID CLINIC ONLY): CD4 % Helper T Cell: 7 % — ABNORMAL LOW (ref 33–55)

## 2011-05-24 ENCOUNTER — Other Ambulatory Visit (HOSPITAL_BASED_OUTPATIENT_CLINIC_OR_DEPARTMENT_OTHER): Payer: BC Managed Care – PPO | Admitting: Lab

## 2011-05-24 DIAGNOSIS — C210 Malignant neoplasm of anus, unspecified: Secondary | ICD-10-CM

## 2011-05-24 LAB — CBC WITH DIFFERENTIAL/PLATELET
Basophils Absolute: 0 10*3/uL (ref 0.0–0.1)
EOS%: 1.6 % (ref 0.0–7.0)
Eosinophils Absolute: 0.1 10*3/uL (ref 0.0–0.5)
HCT: 29.7 % — ABNORMAL LOW (ref 38.4–49.9)
HGB: 10.3 g/dL — ABNORMAL LOW (ref 13.0–17.1)
MCH: 39.4 pg — ABNORMAL HIGH (ref 27.2–33.4)
NEUT%: 43 % (ref 39.0–75.0)
lymph#: 1.4 10*3/uL (ref 0.9–3.3)

## 2011-05-25 ENCOUNTER — Encounter: Payer: Self-pay | Admitting: *Deleted

## 2011-05-31 ENCOUNTER — Telehealth: Payer: Self-pay | Admitting: Nurse Practitioner

## 2011-05-31 ENCOUNTER — Other Ambulatory Visit (HOSPITAL_BASED_OUTPATIENT_CLINIC_OR_DEPARTMENT_OTHER): Payer: BC Managed Care – PPO

## 2011-05-31 DIAGNOSIS — C210 Malignant neoplasm of anus, unspecified: Secondary | ICD-10-CM

## 2011-05-31 LAB — CBC WITH DIFFERENTIAL/PLATELET
Basophils Absolute: 0 10*3/uL (ref 0.0–0.1)
Eosinophils Absolute: 0 10*3/uL (ref 0.0–0.5)
HCT: 29.3 % — ABNORMAL LOW (ref 38.4–49.9)
HGB: 10.1 g/dL — ABNORMAL LOW (ref 13.0–17.1)
LYMPH%: 45.8 % (ref 14.0–49.0)
MCV: 115.1 fL — ABNORMAL HIGH (ref 79.3–98.0)
MONO%: 17.9 % — ABNORMAL HIGH (ref 0.0–14.0)
NEUT#: 1.3 10*3/uL — ABNORMAL LOW (ref 1.5–6.5)
NEUT%: 35.3 % — ABNORMAL LOW (ref 39.0–75.0)
Platelets: 84 10*3/uL — ABNORMAL LOW (ref 140–400)
RBC: 2.55 10*6/uL — ABNORMAL LOW (ref 4.20–5.82)

## 2011-05-31 NOTE — Telephone Encounter (Signed)
Left message requesting return call.  Need to caution patient re: neutropenic precautions d/t ANC 1.3.  Results reviewed with Belenda Cruise, [place].

## 2011-06-01 ENCOUNTER — Ambulatory Visit (INDEPENDENT_AMBULATORY_CARE_PROVIDER_SITE_OTHER): Payer: BC Managed Care – PPO | Admitting: Internal Medicine

## 2011-06-01 VITALS — BP 132/83 | HR 74 | Temp 97.9°F | Ht 74.0 in | Wt 197.0 lb

## 2011-06-01 DIAGNOSIS — B20 Human immunodeficiency virus [HIV] disease: Secondary | ICD-10-CM

## 2011-06-01 NOTE — Progress Notes (Signed)
Patient ID: Matthew Cortez, male   DOB: [birth year], 51 y.o.   MRN: [id number]  INFECTIOUS DISEASE PROGRESS NOTE    Subjective: Matthew Cortez is in for his routine visit. He completed his chemotherapy and radiation therapy for anal cancer about 3 weeks ago and had his PICC removed. He still having a great deal of rectal pain and finds that his Vicodin is not lasting through the night. He is not having any problems with constipation or diarrhea. He has about 3 soft stools daily.   He denies missing any doses of his Truvada, Retrovir, Prezista or Norvir. He states that he completed his 2 month course of Zantac and stopped taking it.  Objective: Temp: 97.9 F (36.6 C) (04/30 1017) Temp src: Oral (04/30 1017) BP: 132/83 mmHg (04/30 1017) Pulse Rate: 74  (04/30 1017)  General: He is in good spirits Skin: No rash Oral: The lesions Lungs: Clear Cor: Regular S1 and S2 no murmurs  Lab Results HIV 1 RNA Quant (copies/mL)  Date Value  05/18/2011 1325*  03/02/2011 440*  05/05/2010 106*     CD4 T Cell Abs (cmm)  Date Value  05/18/2011 30*  03/02/2011 60*  05/05/2010 30*     Assessment: I expect that his CD4 count will start to rebound after completing chemotherapy. His viral load has reactivated so I will check a genotype resistance test to see if he has any new resistance mutations.  Plan: 1. Continue current antiretroviral regimen for now 2. Check a genotype resistance test 3. I suggested that he talk with Dr. Mitzi Hansen who has been managing his pain 4. Followup in 6-8 weeks   Cliffton Asters, MD [facility] [phone] pager   [phone] cell 06/01/2011, 10:37 AM

## 2011-06-07 ENCOUNTER — Encounter: Payer: Self-pay | Admitting: *Deleted

## 2011-06-08 LAB — HIV-1 GENOTYPR PLUS

## 2011-06-08 NOTE — Progress Notes (Signed)
  Radiation Oncology         [phone]) [phone] ________________________________  Name: Matthew Cortez MRN: [id number]  Date: 05/07/2011  DOB: 1960-06-28  End of Treatment Note  Diagnosis:    Anal cancer     Indication for treatment:  Curative       Radiation treatment dates:   03/29/2011 through 05/07/2011  Site/dose:   The patient was treated using and I am RT technique on her chemotherapy unit. A high dose region received 54 gray in 30 fractions. The treatment was carried out using daily image guidance and 6 MV photons were used.  Narrative: The patient tolerated radiation treatment relatively well.   The patient did quite well during the course of his treatment. He did exhibit some acute toxicity in terms of skin/anal irritation.  Plan: The patient has completed radiation treatment. The patient will return to radiation oncology clinic for routine followup in one month. I advised the patient to call or return sooner if they have any questions or concerns related to their recovery or treatment. ________________________________  Radene Gunning, M.D., Ph.D.

## 2011-06-08 NOTE — Progress Notes (Signed)
  Radiation Oncology         [phone]) [phone] ________________________________  Name: Zeplin Aleshire MRN: [id number]  Date: 03/12/2011  DOB: 28-Oct-1960  SIMULATION AND TREATMENT PLANNING NOTE   CONSENT VERIFIED: yes   SET UP: Patient is set-up supine   IMMOBILIZATION: The following immobilization is used: alpha-cradle. This complex treatment device will be used on a daily basis during the patient's treatment.   Diagnosis: Anal cancer   NARRATIVE: The patient was brought to the CT Simulation planning suite. Identity was confirmed. All relevant records and images related to the planned course of therapy were reviewed. Then, the patient was positioned in a stable reproducible clinical set-up for radiation therapy using an aquaplast mask. Skin markings were placed. The CT images were loaded into the planning software where the target and avoidance structures were contoured.The radiation prescription was entered and confirmed.   The patient will receive 54 Gy in 30 fractions to the high-dose target region.  Daily image guidance is ordered, and this will be used on a daily basis. This is necessary to ensure accurate and precise localization of the target in addition to accurate alignment of the normal tissue structures in this region. This is particularly important given the possible motion of the high-dose target.  Treatment planning then occurred.   I have requested : Intensity Modulated Radiotherapy (IMRT) is medically necessary for this case for the following reason: Dose homogeneity; the target is in close proximity to critical normal structures, including the femoral heads, bladder, and small bowel. IMRT is thus medically to appropriately treat the patient.   Special treatment procedure  The patient will receive chemotherapy during the course of radiation treatment. The patient may experience increased or overlapping toxicity due to this combined-modality approach and the patient will be monitored  for such problems. This may include extra lab work as necessary. This therefore constitutes a special treatment procedure.    ________________________________  Radene Gunning, MD, PhD

## 2011-06-09 ENCOUNTER — Other Ambulatory Visit (HOSPITAL_BASED_OUTPATIENT_CLINIC_OR_DEPARTMENT_OTHER): Payer: BC Managed Care – PPO | Admitting: Lab

## 2011-06-09 ENCOUNTER — Ambulatory Visit (HOSPITAL_BASED_OUTPATIENT_CLINIC_OR_DEPARTMENT_OTHER): Payer: BC Managed Care – PPO | Admitting: Hematology and Oncology

## 2011-06-09 ENCOUNTER — Encounter: Payer: Self-pay | Admitting: Hematology and Oncology

## 2011-06-09 ENCOUNTER — Telehealth: Payer: Self-pay | Admitting: Hematology and Oncology

## 2011-06-09 VITALS — BP 111/66 | HR 67 | Temp 97.9°F | Ht 74.0 in | Wt 197.6 lb

## 2011-06-09 DIAGNOSIS — C210 Malignant neoplasm of anus, unspecified: Secondary | ICD-10-CM

## 2011-06-09 DIAGNOSIS — B20 Human immunodeficiency virus [HIV] disease: Secondary | ICD-10-CM

## 2011-06-09 LAB — CBC WITH DIFFERENTIAL/PLATELET
Eosinophils Absolute: 0.1 10*3/uL (ref 0.0–0.5)
HCT: 30 % — ABNORMAL LOW (ref 38.4–49.9)
HGB: 10.7 g/dL — ABNORMAL LOW (ref 13.0–17.1)
LYMPH%: 45 % (ref 14.0–49.0)
MONO#: 1 10*3/uL — ABNORMAL HIGH (ref 0.1–0.9)
NEUT#: 1.5 10*3/uL (ref 1.5–6.5)
NEUT%: 32 % — ABNORMAL LOW (ref 39.0–75.0)
Platelets: 167 10*3/uL (ref 140–400)
WBC: 4.8 10*3/uL (ref 4.0–10.3)
lymph#: 2.2 10*3/uL (ref 0.9–3.3)

## 2011-06-09 LAB — COMPREHENSIVE METABOLIC PANEL
CO2: 29 mEq/L (ref 19–32)
Calcium: 8.8 mg/dL (ref 8.4–10.5)
Chloride: 101 mEq/L (ref 96–112)
Creatinine, Ser: 1.07 mg/dL (ref 0.50–1.35)
Glucose, Bld: 87 mg/dL (ref 70–99)
Total Bilirubin: 0.3 mg/dL (ref 0.3–1.2)
Total Protein: 7.2 g/dL (ref 6.0–8.3)

## 2011-06-09 NOTE — Telephone Encounter (Signed)
appts made and printed for pt aom °

## 2011-06-09 NOTE — Progress Notes (Signed)
This office note has been dictated.

## 2011-06-09 NOTE — Progress Notes (Signed)
CC:   Matthew Cortez, M.D. Matthew Sportsman, MD Matthew Cortez, M.D., Ph.D. Matthew Pew, MD  IDENTIFYING STATEMENT:  Patient is a 51 year old man with anal cancer who presents for followup.  INTERVAL HISTORY:  Matthew Cortez completed chemo radiation therapy a month ago.  He notes improvement in anal discomfort.  He is minimally requiring pain medication.  He is moving his bowels without difficulty. He denies mucositis.  He denies nausea.  His energy level is almost at baseline.  MEDICATIONS:  Reviewed and updated.  ALLERGIES:  Doxycycline, sulfonamides, amoxicillin, nevirapine, penicillin.  PAST MEDICAL HISTORY/FAMILY HISTORY/SOCIAL HISTORY:  Unchanged.  REVIEW OF SYSTEMS:  As above.  A 10-point review of systems negative.  PHYSICAL EXAM:  The patient is a well-appearing, well-nourished man in no distress.  Vitals:  Pulse 57, blood pressure 111/66, temperature 97.9, respirations 20, weight 197.6 pounds.  HEENT:  Head is atraumatic, normocephalic.  Sclerae anicteric.  Mouth moist.  Neck:  Supple.  Chest: Clear to percussion and auscultation.  CVS:  First and second heart sounds present.  No added sounds or murmurs.  Abdomen:  Soft, nontender. Bowel sounds present.  Rectal:  Exam deferred.  Extremities:  No calf tenderness.  LAB DATA:  06/09/2011, white cell count 4.8, hemoglobin 10.7, hematocrit 30, platelets 167.  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  He is status post concurrent radiation therapy with mitomycin and 5-FU from 03/29/2011 through 05/07/2011.  His labs are recovering quite nicely. He appears to be healing well.  He is scheduled to follow up in 8 weeks' time with a PET scan.  Thereafter, the patient will be referred to Dr. Karie Soda for an anal exam plus/minus biopsy.    ______________________________ Laurice Record, M.D. LIO/MEDQ  D:  06/09/2011  T:  06/09/2011  Job:  [id number]

## 2011-06-09 NOTE — Patient Instructions (Signed)
Matthew Cortez  [id number]  [facility] Discharge Instructions  RECOMMENDATIONS MADE BY THE CONSULTANT AND ANY TEST RESULTS WILL BE SENT TO YOUR REFERRING DOCTOR.   EXAM FINDINGS BY MD TODAY AND SIGNS AND SYMPTOMS TO REPORT TO CLINIC OR PRIMARY MD:   Your current list of medications are: Current Outpatient Prescriptions  Medication Sig Dispense Refill  . acetaminophen (TYLENOL) 325 MG tablet Take 650 mg by mouth every 6 (six) hours as needed.      . Darunavir Ethanolate (PREZISTA) 800 MG TABS Take 1 tablet by mouth daily.  30 tablet  11  . emtricitabine-tenofovir (TRUVADA) 200-300 MG per tablet Take 1 tablet by mouth daily.  90 tablet  3  . fluconazole (DIFLUCAN) 100 MG tablet Take 100 mg by mouth once a week.        Marland Kitchen HYDROcodone-acetaminophen (VICODIN ES) 7.5-750 MG per tablet Take 1-2 tablets by mouth every 6 (six) hours as needed for pain.  120 tablet  0  . prochlorperazine (COMPAZINE) 10 MG tablet Take 1 tablet (10 mg total) by mouth every 8 (eight) hours as needed. Gave pt original rx at visit today 03/16/11.  30 tablet  0  . ritonavir (NORVIR) 100 MG TABS Take 1 tablet (100 mg total) by mouth daily.  90 tablet  3  . zidovudine (RETROVIR) 300 MG tablet Take 1 tablet (300 mg total) by mouth 2 (two) times daily.  120 tablet  3     INSTRUCTIONS GIVEN AND DISCUSSED:   SPECIAL INSTRUCTIONS/FOLLOW-UP:  See above.  I acknowledge that I have been informed and understand all the instructions given to me and received a copy. I do not have any more questions at this time, but understand that I may call the [facility] at [phone] during business hours should I have any further questions or need assistance in obtaining follow-up care.

## 2011-06-11 ENCOUNTER — Ambulatory Visit
Admission: RE | Admit: 2011-06-11 | Discharge: 2011-06-11 | Disposition: A | Discharge status: Home / Self Care | Payer: BC Managed Care – PPO | Source: Ambulatory Visit | Attending: Radiation Oncology | Admitting: Radiation Oncology

## 2011-06-11 DIAGNOSIS — C210 Malignant neoplasm of anus, unspecified: Secondary | ICD-10-CM

## 2011-06-11 NOTE — Progress Notes (Signed)
  Radiation Oncology         [phone]) [phone] ________________________________  Name: Matthew Cortez MRN: [id number]  Date: 06/11/2011  DOB: 1960-09-10  Follow-Up Visit Note  CC: Cliffton Asters, MD, MD  Michaell Cowing Salvatore Decent., MD  Arlan Organ, M.D.  Diagnosis:   Squamous cell carcinoma of the anal canal  Interval Since Last Radiation:  One month   Narrative:  The patient returns today for routine follow-up.  Patient states that he is doing much better at this time. He noted that he had some increased irritation for one to 2 weeks after treatment but this has improved over the last 2 weeks. He has no pain at baseline. He does take some pain medicine after bowel movements which he states are regular. He denies any loose stools or ongoing diarrhea.                              ALLERGIES:  is allergic to doxycycline; sulfonamide derivatives; amoxicillin; nevirapine; and penicillins.  Meds: Current Outpatient Prescriptions  Medication Sig Dispense Refill  . acetaminophen (TYLENOL) 325 MG tablet Take 650 mg by mouth every 6 (six) hours as needed.      . Darunavir Ethanolate (PREZISTA) 800 MG TABS Take 1 tablet by mouth daily.  30 tablet  11  . emtricitabine-tenofovir (TRUVADA) 200-300 MG per tablet Take 1 tablet by mouth daily.  90 tablet  3  . fluconazole (DIFLUCAN) 100 MG tablet Take 100 mg by mouth once a week.        Marland Kitchen HYDROcodone-acetaminophen (VICODIN ES) 7.5-750 MG per tablet Take 1-2 tablets by mouth every 6 (six) hours as needed for pain.  120 tablet  0  . prochlorperazine (COMPAZINE) 10 MG tablet Take 1 tablet (10 mg total) by mouth every 8 (eight) hours as needed. Gave pt original rx at visit today 03/16/11.  30 tablet  0  . ritonavir (NORVIR) 100 MG TABS Take 1 tablet (100 mg total) by mouth daily.  90 tablet  3  . zidovudine (RETROVIR) 300 MG tablet Take 1 tablet (300 mg total) by mouth 2 (two) times daily.  120 tablet  3    Physical Findings: The patient is in no acute distress. Patient  is alert and oriented.  vitals were not taken for this visit..   The skin looks good and is healing well at this time. No ongoing significant desquamation. Hyperpigmentation present anteriorly as well in the inguinal region with no ongoing desquamation. Patient does have hemorrhoids. Much improved on rectal exam I believe with some residual fibrosis. Patient does have some internal hemorrhoids as well. No blood on exam glove  Lab Findings: Lab Results  Component Value Date   WBC 4.8 06/09/2011   HGB 10.7* 06/09/2011   HCT 30.0* 06/09/2011   MCV 108.7* 06/09/2011   PLT 167 06/09/2011     Radiographic Findings: No results found.  Impression:    51 year old male status post chemoradiotherapy for his anal cancer. He is one month out from radiation and is recovering satisfactorily. He is due for a PET scan which has been scheduled in July.  Plan:  I will have him return to our clinic in 3 months   Radene Gunning, M.D., Ph.D.

## 2011-06-11 NOTE — Progress Notes (Signed)
F/u squamous cell anal canal rad txs= completed 05/06/11  Alert,orientedx3, takes pain med for bowel movements, every time stated, " still raw inside,normal every day soft stool" improving, feels he is healing inside, works full time, has Pet scheduled for 08/09/11 at 9:30am completed his chemotherapy 05/07/11, denys pain,[place] at present 11:49 AM

## 2011-06-22 NOTE — Progress Notes (Signed)
Encounter addended by: Glennie Hawk, RN on: 06/22/2011  2:36 PM<BR>     Documentation filed: Charges VN

## 2011-07-29 ENCOUNTER — Ambulatory Visit: Payer: BC Managed Care – PPO | Admitting: Internal Medicine

## 2011-08-09 ENCOUNTER — Encounter (HOSPITAL_COMMUNITY)
Admission: RE | Admit: 2011-08-09 | Discharge: 2011-08-09 | Disposition: A | Discharge status: Home / Self Care | Payer: BC Managed Care – PPO | Source: Ambulatory Visit | Attending: Hematology and Oncology | Admitting: Hematology and Oncology

## 2011-08-09 ENCOUNTER — Other Ambulatory Visit (HOSPITAL_BASED_OUTPATIENT_CLINIC_OR_DEPARTMENT_OTHER): Payer: BC Managed Care – PPO | Admitting: Lab

## 2011-08-09 DIAGNOSIS — C210 Malignant neoplasm of anus, unspecified: Secondary | ICD-10-CM

## 2011-08-09 LAB — CBC WITH DIFFERENTIAL/PLATELET
BASO%: 0.4 % (ref 0.0–2.0)
Eosinophils Absolute: 0 10*3/uL (ref 0.0–0.5)
LYMPH%: 34.3 % (ref 14.0–49.0)
MCHC: 34.6 g/dL (ref 32.0–36.0)
MONO#: 0.6 10*3/uL (ref 0.1–0.9)
NEUT#: 1.5 10*3/uL (ref 1.5–6.5)
Platelets: 151 10*3/uL (ref 140–400)
RBC: 3.08 10*6/uL — ABNORMAL LOW (ref 4.20–5.82)
WBC: 3.3 10*3/uL — ABNORMAL LOW (ref 4.0–10.3)
lymph#: 1.1 10*3/uL (ref 0.9–3.3)

## 2011-08-09 LAB — COMPREHENSIVE METABOLIC PANEL
ALT: 20 U/L (ref 0–53)
Albumin: 4.2 g/dL (ref 3.5–5.2)
CO2: 22 mEq/L (ref 19–32)
Calcium: 8.5 mg/dL (ref 8.4–10.5)
Chloride: 109 mEq/L (ref 96–112)
Glucose, Bld: 98 mg/dL (ref 70–99)
Potassium: 4.3 mEq/L (ref 3.5–5.3)
Sodium: 140 mEq/L (ref 135–145)
Total Bilirubin: 0.4 mg/dL (ref 0.3–1.2)
Total Protein: 6.6 g/dL (ref 6.0–8.3)

## 2011-08-09 LAB — GLUCOSE, CAPILLARY: Glucose-Capillary: 98 mg/dL (ref 70–99)

## 2011-08-09 LAB — LACTATE DEHYDROGENASE: LDH: 156 U/L (ref 94–250)

## 2011-08-09 MED ORDER — FLUDEOXYGLUCOSE F - 18 (FDG) INJECTION
18.7000 | Freq: Once | INTRAVENOUS | Status: AC | PRN
  Administered 2011-08-09: 18.7 via INTRAVENOUS

## 2011-08-11 ENCOUNTER — Encounter: Payer: Self-pay | Admitting: Hematology and Oncology

## 2011-08-11 ENCOUNTER — Ambulatory Visit (HOSPITAL_BASED_OUTPATIENT_CLINIC_OR_DEPARTMENT_OTHER): Payer: BC Managed Care – PPO | Admitting: Hematology and Oncology

## 2011-08-11 ENCOUNTER — Telehealth: Payer: Self-pay | Admitting: Hematology and Oncology

## 2011-08-11 VITALS — BP 118/65 | HR 63 | Temp 97.1°F | Ht 74.0 in | Wt 207.3 lb

## 2011-08-11 DIAGNOSIS — C210 Malignant neoplasm of anus, unspecified: Secondary | ICD-10-CM

## 2011-08-11 DIAGNOSIS — B20 Human immunodeficiency virus [HIV] disease: Secondary | ICD-10-CM

## 2011-08-11 NOTE — Telephone Encounter (Signed)
appts made and printed for pt  °

## 2011-08-11 NOTE — Patient Instructions (Addendum)
Matthew Cortez  [id number]  [facility] Discharge Instructions  RECOMMENDATIONS MADE BY THE CONSULTANT AND ANY TEST RESULTS WILL BE SENT TO YOUR REFERRING DOCTOR.   EXAM FINDINGS BY MD TODAY AND SIGNS AND SYMPTOMS TO REPORT TO CLINIC OR PRIMARY MD:   Your current list of medications are: Current Outpatient Prescriptions  Medication Sig Dispense Refill  . acetaminophen (TYLENOL) 325 MG tablet Take 650 mg by mouth every 6 (six) hours as needed.      . Darunavir Ethanolate (PREZISTA) 800 MG TABS Take 1 tablet by mouth daily.  30 tablet  11  . emtricitabine-tenofovir (TRUVADA) 200-300 MG per tablet Take 1 tablet by mouth daily.  90 tablet  3  . fluconazole (DIFLUCAN) 100 MG tablet Take 100 mg by mouth once a week.        . ritonavir (NORVIR) 100 MG TABS Take 1 tablet (100 mg total) by mouth daily.  90 tablet  3  . zidovudine (RETROVIR) 300 MG tablet Take 1 tablet (300 mg total) by mouth 2 (two) times daily.  120 tablet  3  . HYDROcodone-acetaminophen (VICODIN ES) 7.5-750 MG per tablet Take 1-2 tablets by mouth every 6 (six) hours as needed for pain.  120 tablet  0  . prochlorperazine (COMPAZINE) 10 MG tablet Take 1 tablet (10 mg total) by mouth every 8 (eight) hours as needed. Gave pt original rx at visit today 03/16/11.  30 tablet  0     INSTRUCTIONS GIVEN AND DISCUSSED:   SPECIAL INSTRUCTIONS/FOLLOW-UP:  See above.  I acknowledge that I have been informed and understand all the instructions given to me and received a copy. I do not have any more questions at this time, but understand that I may call the [facility] at [phone] during business hours should I have any further questions or need assistance in obtaining follow-up care.

## 2011-08-11 NOTE — Progress Notes (Signed)
CC:   Ardeth Sportsman, MD Cliffton Asters, M.D. Radene Gunning, M.D., Ph.D. [name], MD  IDENTIFYING STATEMENT:  Patient is a 51 year old man with anal cancer who presents for followup.  INTERVAL HISTORY:  Mr. Pemble has no current concerns.  He feels well. His weight is stable.  He is moving his bowels with very minimal difficulty.  His energy is back at baseline.  He received a recent PET scan on 08/09/2011.  The PET scan showed very mild uptake in the rectum without a dominant area of hypermetabolism and there was no evidence of metastatic disease.  MEDICATIONS:  Reviewed and updated.  ALLERGIES:  None.  PAST MEDICAL HISTORY/FAMILY HISTORY/SOCIAL HISTORY:  Unchanged.  REVIEW OF SYSTEMS:  Ten point review of systems negative.  PHYSICAL EXAM:  The patient is a well-appearing, well-nourished man in no distress.  Vitals:  Pulse 63, blood pressure 118/65, temperature 97.1, respirations 20, weight 207 pounds.  HEENT:  Head is atraumatic, normocephalic.  Sclerae anicteric.  Mouth moist.  Neck:  Supple.  Chest: Clear.  CVS:  Unremarkable.  Abdomen:  Soft, nontender.  Bowel sounds present.  Extremities:  No edema.  Lymph nodes:  No palpable adenopathy. Rectal: Not performed.  LABORATORY DATA:  08/09/2011 white count 3.3, hemoglobin 12.3, hematocrit 35.6, platelets 151.  Sodium 140, potassium 4.3, chloride 109, CO2 22, BUN 19, creatinine 0.95, glucose 98.  T bilirubin 0.4, alkaline phosphatase 57, AST 26, ALT 20, calcium 8.5.  Results of PET scan as in interval history.  IMPRESSION AND PLAN:  Mr. 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  He is status post concurrent radiation therapy with mitomycin and 5-FU from 03/09/2011 through 05/07/2011.  Recent PET scan indicates no evidence of metastatic disease.  Mr. Peplinski requires anal/rectal  evaluation with or without biopsy.  I will referred him back to Dr. Karie Soda.  He follows up in 4 months' time for an office visit.    ______________________________ Laurice Record, M.D. LIO/MEDQ  D:  08/11/2011  T:  08/11/2011  Job:  [id number]

## 2011-08-11 NOTE — Progress Notes (Signed)
This office note has been dictated.

## 2011-08-24 ENCOUNTER — Encounter: Payer: Self-pay | Admitting: Internal Medicine

## 2011-08-24 ENCOUNTER — Ambulatory Visit (INDEPENDENT_AMBULATORY_CARE_PROVIDER_SITE_OTHER): Payer: BC Managed Care – PPO | Admitting: Internal Medicine

## 2011-08-24 VITALS — BP 108/70 | HR 61 | Temp 98.4°F | Ht 74.0 in | Wt 206.5 lb

## 2011-08-24 DIAGNOSIS — R05 Cough: Secondary | ICD-10-CM

## 2011-08-24 DIAGNOSIS — B20 Human immunodeficiency virus [HIV] disease: Secondary | ICD-10-CM

## 2011-08-24 LAB — CBC
HCT: 32.9 % — ABNORMAL LOW (ref 39.0–52.0)
MCH: 38 pg — ABNORMAL HIGH (ref 26.0–34.0)
MCHC: 35 g/dL (ref 30.0–36.0)
MCV: 108.6 fL — ABNORMAL HIGH (ref 78.0–100.0)
Platelets: 214 10*3/uL (ref 150–400)
RDW: 13.4 % (ref 11.5–15.5)
WBC: 3.2 10*3/uL — ABNORMAL LOW (ref 4.0–10.5)

## 2011-08-24 LAB — COMPREHENSIVE METABOLIC PANEL
ALT: 21 U/L (ref 0–53)
AST: 22 U/L (ref 0–37)
BUN: 22 mg/dL (ref 6–23)
Calcium: 8.7 mg/dL (ref 8.4–10.5)
Chloride: 104 mEq/L (ref 96–112)
Creat: 1.1 mg/dL (ref 0.50–1.35)
Total Bilirubin: 2.6 mg/dL — ABNORMAL HIGH (ref 0.3–1.2)

## 2011-08-24 NOTE — Progress Notes (Signed)
Patient ID: Matthew Cortez, male   DOB: 06-02-1960, 51 y.o.   MRN: [id number]     [facility]  Patient Active Problem List  Diagnosis  . HIV INFECTION  . CIGARETTE SMOKER  . CHOLELITHIASIS  . DEGENERATIVE DISC DISEASE  . BACK PAIN, LUMBAR, WITH RADICULOPATHY  . Squamous cell carcinoma of anus  . Cancer  . Cough    Patient's Medications  New Prescriptions   No medications on file  Previous Medications   DAPSONE 100 MG TABLET    Take 100 mg by mouth daily.   DARUNAVIR ETHANOLATE (PREZISTA) 800 MG TABS    Take 1 tablet by mouth daily.   EMTRICITABINE-TENOFOVIR (TRUVADA) 200-300 MG PER TABLET    Take 1 tablet by mouth daily.   FLUCONAZOLE (DIFLUCAN) 100 MG TABLET    Take 100 mg by mouth once a week.     RITONAVIR (NORVIR) 100 MG TABS    Take 1 tablet (100 mg total) by mouth daily.   ZIDOVUDINE (RETROVIR) 300 MG TABLET    Take 1 tablet (300 mg total) by mouth 2 (two) times daily.  Modified Medications   No medications on file  Discontinued Medications   ACETAMINOPHEN (TYLENOL) 325 MG TABLET    Take 650 mg by mouth every 6 (six) hours as needed.   HYDROCODONE-ACETAMINOPHEN (VICODIN ES) 7.5-750 MG PER TABLET    Take 1-2 tablets by mouth every 6 (six) hours as needed for pain.   PROCHLORPERAZINE (COMPAZINE) 10 MG TABLET    Take 1 tablet (10 mg total) by mouth every 8 (eight) hours as needed. Gave pt original rx at visit today 03/16/11.    Subjective: Matthew Cortez is in for his routine visit. He is feeling much better and is now pain free. He states that his PET scan looked good but he has a followup visit with Dr. gross early next month. He has not missed any doses of his HIV medications. He had some dry cough and shortness of breath while moving some boxes at work last week. He wonders if it might be do to seasonal allergies which she suffered from many years ago. He is feeling better now. He has not had any fever.  Objective: Temp: 98.4 F (36.9 C) (07/23 0955) Temp  src: Oral (07/23 0955) BP: 108/70 mmHg (07/23 0955) Pulse Rate: 61  (07/23 0955)  General: He is in good spirits and appears healthy Skin: No rash Lungs: Clear Cor: Regular S1 and S2 and no murmurs Abdomen: Nontender  Lab Results HIV 1 RNA Quant (copies/mL)  Date Value  05/18/2011 1325*  03/02/2011 440*  05/05/2010 106*     CD4 T Cell Abs (cmm)  Date Value  05/18/2011 30*  03/02/2011 60*  05/05/2010 30*     Assessment: His viral load was elevated at the time of his last visit but he genotype resistance testing did not show any resistance mutations. I will repeat his lab work today and continue his current regimen for now.  Plan: 1. Continue his current antiretroviral regimen 2. Repeat CD4 and viral load today 3. Followup in 3 months, sooner if his viral load remains elevated.   Cliffton Asters, MD [facility] [phone] pager   [phone] cell 08/24/2011, 10:20 AM

## 2011-08-31 ENCOUNTER — Telehealth (INDEPENDENT_AMBULATORY_CARE_PROVIDER_SITE_OTHER): Payer: Self-pay

## 2011-08-31 NOTE — Telephone Encounter (Signed)
LMOM for home and work. I have r/s the appt for 8/5 to 8/14 b/c the office on 8/5 got canceled. The pt needs his new appt date and time.

## 2011-09-06 ENCOUNTER — Encounter (INDEPENDENT_AMBULATORY_CARE_PROVIDER_SITE_OTHER): Payer: BC Managed Care – PPO | Admitting: Surgery

## 2011-09-08 ENCOUNTER — Encounter: Payer: Self-pay | Admitting: Radiation Oncology

## 2011-09-10 ENCOUNTER — Encounter: Payer: Self-pay | Admitting: Radiation Oncology

## 2011-09-10 ENCOUNTER — Ambulatory Visit
Admission: RE | Admit: 2011-09-10 | Discharge: 2011-09-10 | Disposition: A | Discharge status: Home / Self Care | Payer: BC Managed Care – PPO | Source: Ambulatory Visit | Attending: Radiation Oncology | Admitting: Radiation Oncology

## 2011-09-10 VITALS — BP 117/76 | HR 61 | Temp 97.7°F | Resp 18 | Wt 208.3 lb

## 2011-09-10 DIAGNOSIS — C210 Malignant neoplasm of anus, unspecified: Secondary | ICD-10-CM

## 2011-09-10 NOTE — Progress Notes (Signed)
  Radiation Oncology         [phone]) [phone] ________________________________  Name: Matthew Cortez MRN: [id number]  Date: 09/10/2011  DOB: [birth year]  Follow-Up Visit Note  CC: Cliffton Asters, MD  Michaell Cowing Salvatore Decent., MD  Diagnosis:   Squamous cell carcinoma of the anal canal  Interval Since Last Radiation:  4 months   Narrative:  The patient returns today for routine follow-up.  The patient states that he is doing very well. He really feels that things have gotten back to essentially normal. He denies any discomfort with bowel movements. No diarrhea and no nausea. No fatigue. Overall the patient feels that his skin has healed very nicely. The patient did have a recent PET scan which showed some mild patchy activity within the rectum with no clear dominate area and no clear mass/solid area on CT scan of concern.                              ALLERGIES:  is allergic to doxycycline; sulfonamide derivatives; amoxicillin; nevirapine; and penicillins.  Meds: Current Outpatient Prescriptions  Medication Sig Dispense Refill  . dapsone 100 MG tablet Take 100 mg by mouth daily.      . Darunavir Ethanolate (PREZISTA) 800 MG TABS Take 1 tablet by mouth daily.  30 tablet  11  . emtricitabine-tenofovir (TRUVADA) 200-300 MG per tablet Take 1 tablet by mouth daily.  90 tablet  3  . fluconazole (DIFLUCAN) 100 MG tablet Take 100 mg by mouth once a week.        . ritonavir (NORVIR) 100 MG TABS Take 1 tablet (100 mg total) by mouth daily.  90 tablet  3  . zidovudine (RETROVIR) 300 MG tablet Take 1 tablet (300 mg total) by mouth 2 (two) times daily.  120 tablet  3    Physical Findings: The patient is in no acute distress. Patient is alert and oriented.  weight is 208 lb 4.8 oz (94.484 kg). His oral temperature is 97.7 F (36.5 C). His blood pressure is 117/76 and his pulse is 61. His respiration is 18. .   General: Well-developed, in no acute distress HEENT: Normocephalic, atraumatic Cardiovascular: Regular rate  and rhythm Respiratory: Clear to auscultation bilaterally GI: Soft, nontender, normal bowel sounds Extremities: No edema present Rectal: EH and affect is present within the anal region. No lesions/suspicious areas external he. No suspicious lesions palpated on digital rectal exam. No blood.  Lab Findings: Lab Results  Component Value Date   WBC 3.2* 08/24/2011   HGB 11.5* 08/24/2011   HCT 32.9* 08/24/2011   MCV 108.6* 08/24/2011   PLT 214 08/24/2011     Radiographic Findings: No results found.  Impression:    Pleasant 51 year old male who has had a nice response clinically on my exam to his course of chemoradiotherapy. The side effects have greatly improved since completing treatment.  Plan:  Return to clinic in 6 months. The patient is scheduled to see Dr. gross in the near future as well.  I spent 15 minutes with the patient today, the majority of which was spent counseling the patient on the diagnosis of cancer and coordinating care.   Radene Gunning, M.D., Ph.D.

## 2011-09-10 NOTE — Progress Notes (Signed)
HERE TODAY FOR PUT OF ANAL CANCER.  NO PAIN TODAY.  NO PROBLEMS WITH SKIN AND BOWELS ARE FUNCTIONING WELL.  SAYS EVERYTHING IS BACK TO NORMAL FOR HIM. TO SCHEDULE AN APPOINTMENT WITH DR. Michaell Cowing FOR FU.  PET SCAN DONE ON 08/09/11

## 2011-09-15 ENCOUNTER — Encounter (INDEPENDENT_AMBULATORY_CARE_PROVIDER_SITE_OTHER): Payer: Self-pay | Admitting: Surgery

## 2011-09-15 ENCOUNTER — Ambulatory Visit (INDEPENDENT_AMBULATORY_CARE_PROVIDER_SITE_OTHER): Payer: BC Managed Care – PPO | Admitting: Surgery

## 2011-09-15 VITALS — BP 124/86 | HR 56 | Temp 98.4°F | Ht 74.0 in | Wt 207.0 lb

## 2011-09-15 DIAGNOSIS — C210 Malignant neoplasm of anus, unspecified: Secondary | ICD-10-CM

## 2011-09-15 MED ORDER — BUPIVACAINE LIPOSOME 1.3 % IJ SUSP: 20.0000 mL | INTRAMUSCULAR | Status: AC

## 2011-09-15 NOTE — Patient Instructions (Signed)
See the Handout(s) we gave you.  Consider surgery.  Please call our office at [phone] if you wish to schedule surgery or if you have further questions / concerns.   

## 2011-09-15 NOTE — Progress Notes (Signed)
Subjective:     Patient ID: Matthew Cortez, male   DOB: March 22, 1960, 51 y.o.   MRN: [id number]  HPI  Matthew Cortez  05/29/1960 [id number]  Patient Care Team: Cliffton Asters, MD as PCP - General (Infectious Diseases) Cliffton Asters, MD as PCP - Infectious Diseases (Infectious Diseases) Arlice Colt as Consulting Physician (Gastroenterology) Jonna Coup, MD as Consulting Physician (Radiation Oncology) Laurice Record, MD as Consulting Physician (Medical Oncology)  This patient is a 51 y.o.male who presents today for surgical evaluation.   Reason for evaluation: Followup on treatment of anal cancer.  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  He's undergone chemoradiation therapy all night protocol.  He is feeling much better.  He worked throughout his treatments.  Had some rough irritation a few weeks after completion but is now feeling much better.  No incontinence to flatus or stool.  Having daily bowel movements.  No bleeding.  No pain.  Energy level feels otherwise well.  He's had followup with Dr. Dalene Carrow & Dr. Mitzi Hansen.  The PET scan last month showed no concerning areas.  Patient Active Problem List  Diagnosis  . HIV INFECTION  . CIGARETTE SMOKER  . CHOLELITHIASIS  . DEGENERATIVE DISC DISEASE  . BACK PAIN, LUMBAR, WITH RADICULOPATHY  . Squamous cell carcinoma of anus  . Cough    Past Medical History  Diagnosis Date  . HIV infection   . Anal condyloma   . Abdominal pain   . Difficulty urinating   . Constipation   . Nausea   . Rectal pain   . 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 DDD (degenerative disc disease)   . Cholelithiasis   . Fatigue   . Allergy   . History of radiation therapy 03/29/11- 05/07/11    EOT4/06/2011 anan ca, 52.2Gy  . Cancer 02/11/2011    squamous cell carcinoma of anus  . DDD (degenerative disc disease)     History reviewed. No pertinent past surgical  history.  History   Social History  . Marital Status: Single    Spouse Name: N/A    Number of Children: N/A  . Years of Education: N/A   Occupational History  . Not on file.   Social History Main Topics  . Smoking status: Former Smoker -- 0.2 packs/day for 20 years    Quit date: 03/07/2010  . Smokeless tobacco: Never Used   Comment: off and on for 20 yrs, 4 cigs/day  . Alcohol Use: 6.0 oz/week    12 drink(s) per week     3 glasses of wine per week, none for past 2-3 mos  . Drug Use: No     quit smoking 03/2010  . Sexually Active: Not Currently     declined condoms   Other Topics Concern  . Not on file   Social History Narrative  . No narrative on file    Family History  Problem Relation Age of Onset  . Cancer Mother     abdominal tumor  . Cancer Maternal Aunt     breast  . Diabetes Brother     Current Outpatient Prescriptions  Medication Sig Dispense Refill  . dapsone 100 MG tablet Take 100 mg by mouth daily.      . Darunavir Ethanolate (PREZISTA) 800 MG TABS Take 1 tablet by mouth daily.  30 tablet  11  . emtricitabine-tenofovir (TRUVADA) 200-300 MG per tablet Take  1 tablet by mouth daily.  90 tablet  3  . fluconazole (DIFLUCAN) 100 MG tablet Take 100 mg by mouth once a week.        . ritonavir (NORVIR) 100 MG TABS Take 1 tablet (100 mg total) by mouth daily.  90 tablet  3  . zidovudine (RETROVIR) 300 MG tablet Take 1 tablet (300 mg total) by mouth 2 (two) times daily.  120 tablet  3     Allergies  Allergen Reactions  . Doxycycline Hives    "Burned inside my body, lips split open and bled"  . Sulfonamide Derivatives Hives    "burned inside my body, inside my mouth"  . Amoxicillin     REACTION: rash  . Nevirapine     REACTION: Stevens-Johnson syndrome  . Penicillins     BP 124/86  Pulse 56  Temp 98.4 F (36.9 C) (Temporal)  Ht 6\' 2"  (1.88 m)  Wt 207 lb (93.895 kg)  BMI 26.58 kg/m2  SpO2 98%  No results found.   Review of Systems   Constitutional: Negative for fever, chills and diaphoresis.  HENT: Negative for sore throat, trouble swallowing and neck pain.   Eyes: Negative for photophobia and visual disturbance.  Respiratory: Negative for choking and shortness of breath.   Cardiovascular: Negative for chest pain and palpitations.  Gastrointestinal: Negative for nausea, vomiting, abdominal pain, diarrhea, constipation, blood in stool, abdominal distention, anal bleeding and rectal pain.       No incontinence to flatus/stool.  No encopresis  Genitourinary: Negative for dysuria, urgency, difficulty urinating and testicular pain.  Musculoskeletal: Negative for myalgias, arthralgias and gait problem.  Skin: Negative for color change and rash.  Neurological: Negative for dizziness, speech difficulty, weakness and numbness.  Hematological: Negative for adenopathy.  Psychiatric/Behavioral: Negative for hallucinations, confusion and agitation.       Objective:   Physical Exam  Constitutional: He is oriented to person, place, and time. He appears well-developed and well-nourished. No distress.  HENT:  Head: Normocephalic.  Mouth/Throat: Oropharynx is clear and moist. No oropharyngeal exudate.  Eyes: Conjunctivae and EOM are normal. Pupils are equal, round, and reactive to light. No scleral icterus.  Neck: Normal range of motion. No tracheal deviation present.  Cardiovascular: Normal rate, normal heart sounds and intact distal pulses.   Pulmonary/Chest: Effort normal. No respiratory distress.  Abdominal: Soft. He exhibits no distension and no mass. There is no tenderness. There is no guarding. Hernia confirmed negative in the right inguinal area and confirmed negative in the left inguinal area.       Incisions clean with normal healing ridges.  No hernias  Genitourinary: Penis normal. No penile tenderness.       Exam done with assistance of male Medical Assistant in the room.  Perianal skin clean with good hygiene.  No  pruritis.  No external skin tags / hemorrhoids of significance.  Mild hyperpigmentation.  No pilonidal disease.  No fissure.  No abscess/fistula.    Tolerates digital and anoscopic rectal exam.  Normal sphincter tone.  Pedunculated anal canal masses of left lateral and right posterior.  Probable old hemorrhoids.  Some elevated white linear horizontal scarring Left post lateral  3cm from anal verge at old anal cancer.  Mobile  Musculoskeletal: Normal range of motion. He exhibits no tenderness.  Neurological: He is alert and oriented to person, place, and time. No cranial nerve deficit. He exhibits normal muscle tone. Coordination normal.  Skin: Skin is warm and dry. No rash noted.  He is not diaphoretic.  Psychiatric: He has a normal mood and affect. His behavior is normal.       Assessment:     Anal cancer in HIV positive male status post chemotherapy and radiation therapy with persistent anal canal masses that are probably hemorrhoids but some scarring and white change persistent.  Overall much improved from preop.    Plan:     I think he requires examination under anesthesia.  I removed the anal canal pedunculated masses.  I would like to at least biopsy if not excise the persistent elevated abnormal tissues.  I discussed the case with our new colorectal surgeon, Dr. Maisie Fus.  She agrees.  I think this is distal enough that we do not need the TEM equipment to do this.  Did not want to be initially overaggressive.   I did discuss the procedure with the patient:  The anatomy & physiology of the digestive tract was discussed.  The pathophysiology of the rectal pathology was discussed.  Natural history risks without surgery was discussed.   I feel the risks of no intervention will lead to serious problems that outweigh the operative risks; therefore, I recommended surgery.    Laparoscopic & open abdominal techniques were discussed.  I recommended we start with Removal of the anal canal masses and  biopsy.  Possible partial proctectomy  for excisional biopsy to remove the pathology and hopefully cure and/or control the pathology.  This technique can offer less operative risk and faster post-operative recovery.  Possible need for immediate or later abdominal surgery for further treatment was discussed.   Risks such as bleeding, abscess, reoperation, ostomy, heart attack, death, and other risks were discussed.   I noted a good likelihood this will help address the problem.  Goals of post-operative recovery were discussed as well.  We will work to minimize complications.  An educational handout was given as well.  Questions were answered.  The patient expresses understanding & wishes to proceed with surgery.

## 2011-10-13 ENCOUNTER — Encounter (HOSPITAL_COMMUNITY): Payer: Self-pay | Admitting: Pharmacy Technician

## 2011-10-15 ENCOUNTER — Encounter (HOSPITAL_COMMUNITY): Payer: Self-pay

## 2011-10-15 ENCOUNTER — Encounter (HOSPITAL_COMMUNITY)
Admission: RE | Admit: 2011-10-15 | Discharge: 2011-10-15 | Disposition: A | Discharge status: Home / Self Care | Payer: BC Managed Care – PPO | Source: Ambulatory Visit | Attending: Surgery | Admitting: Surgery

## 2011-10-15 HISTORY — DX: Pneumonia, unspecified organism: J18.9

## 2011-10-15 LAB — CBC
MCV: 110.3 fL — ABNORMAL HIGH (ref 78.0–100.0)
Platelets: 190 10*3/uL (ref 150–400)
RBC: 3.3 MIL/uL — ABNORMAL LOW (ref 4.22–5.81)
RDW: 14.3 % (ref 11.5–15.5)
WBC: 2.6 10*3/uL — ABNORMAL LOW (ref 4.0–10.5)

## 2011-10-15 LAB — SURGICAL PCR SCREEN
MRSA, PCR: NEGATIVE
Staphylococcus aureus: NEGATIVE

## 2011-10-15 NOTE — Patient Instructions (Addendum)
[address]  10/15/2011   Your procedure is scheduled on:  10-22-2011  Report to [facility] at 0800  AM.  Call this number if you have problems the morning of surgery: [phone]   Remember:friend todd ingold to drive you and stay with you for 24 hours after surgery, cell [phone]   Do not eat food or drink liquids:After Midnight.  .  Take these medicines the morning of surgery with A SIP OF WATER: truvada, norvir, retrovir, dapsone  Do not wear jewelry or make up.  Do not wear lotions, powders, or perfumes.Do not wear deodorant.    Do not bring valuables to the hospital.  Contacts, dentures or bridgework may not be worn into surgery.  Leave suitcase in the car. After surgery it may be brought to your room.  For patients admitted to the hospital, checkout time is 11:00 AM the day of discharge                             Patients discharged the day of surgery will not be allowed to drive home. If going home same day of surgery, you must have someone stay with you the first 24 hours at home and arrange for some one to drive you home from hospital.    Special Instructions: CHG Shower Use Special Wash: 1/2 bottle night before surgery and 1/2 bottle morning  of surgery, use regular soap on face and front and back private area. Women do not shave legs or underarms for 2 days before showers. Men may shave face morning of surgery.    Please read over the following fact sheets that you were given: MRSA Information  [facility] pre op nurse phone number [phone], call if needed

## 2011-10-15 NOTE — Progress Notes (Signed)
Cbc results routed to dr gross in basket

## 2011-10-22 ENCOUNTER — Encounter (HOSPITAL_COMMUNITY)
Admission: RE | Disposition: A | Discharge status: Home / Self Care | Payer: Self-pay | Source: Ambulatory Visit | Attending: Surgery

## 2011-10-22 ENCOUNTER — Ambulatory Visit (HOSPITAL_COMMUNITY): Payer: BC Managed Care – PPO | Admitting: Registered Nurse

## 2011-10-22 ENCOUNTER — Ambulatory Visit (HOSPITAL_COMMUNITY)
Admission: RE | Admit: 2011-10-22 | Discharge: 2011-10-22 | Disposition: A | Discharge status: Home / Self Care | Payer: BC Managed Care – PPO | Source: Ambulatory Visit | Attending: Surgery | Admitting: Surgery

## 2011-10-22 ENCOUNTER — Encounter (HOSPITAL_COMMUNITY): Payer: Self-pay

## 2011-10-22 ENCOUNTER — Encounter (HOSPITAL_COMMUNITY): Payer: Self-pay | Admitting: Registered Nurse

## 2011-10-22 DIAGNOSIS — K620 Anal polyp: Secondary | ICD-10-CM | POA: Insufficient documentation

## 2011-10-22 DIAGNOSIS — R198 Other specified symptoms and signs involving the digestive system and abdomen: Secondary | ICD-10-CM | POA: Insufficient documentation

## 2011-10-22 DIAGNOSIS — D013 Carcinoma in situ of anus and anal canal: Secondary | ICD-10-CM

## 2011-10-22 DIAGNOSIS — B20 Human immunodeficiency virus [HIV] disease: Secondary | ICD-10-CM

## 2011-10-22 DIAGNOSIS — K621 Rectal polyp: Secondary | ICD-10-CM | POA: Insufficient documentation

## 2011-10-22 DIAGNOSIS — K6282 Dysplasia of anus: Secondary | ICD-10-CM

## 2011-10-22 DIAGNOSIS — Z21 Asymptomatic human immunodeficiency virus [HIV] infection status: Secondary | ICD-10-CM | POA: Insufficient documentation

## 2011-10-22 DIAGNOSIS — C210 Malignant neoplasm of anus, unspecified: Secondary | ICD-10-CM | POA: Insufficient documentation

## 2011-10-22 HISTORY — PX: RECTAL BIOPSY: SHX2303

## 2011-10-22 SURGERY — BIOPSY, RECTUM
Anesthesia: General | Site: Rectum | Wound class: Clean

## 2011-10-22 MED ORDER — OXYCODONE HCL 5 MG PO TABS: 5.0000 mg | ORAL_TABLET | ORAL | Status: DC | PRN

## 2011-10-22 MED ORDER — METRONIDAZOLE IN NACL 5-0.79 MG/ML-% IV SOLN
500.0000 mg | INTRAVENOUS | Status: AC
  Administered 2011-10-22: .5 g via INTRAVENOUS
  Filled 2011-10-22: qty 100

## 2011-10-22 MED ORDER — KETOROLAC TROMETHAMINE 30 MG/ML IJ SOLN
INTRAMUSCULAR | Status: AC
  Filled 2011-10-22: qty 1

## 2011-10-22 MED ORDER — SUFENTANIL CITRATE 50 MCG/ML IV SOLN
INTRAVENOUS | Status: DC | PRN
  Administered 2011-10-22 (×2): 5 ug via INTRAVENOUS

## 2011-10-22 MED ORDER — HYDROMORPHONE HCL PF 1 MG/ML IJ SOLN: 0.2500 mg | INTRAMUSCULAR | Status: DC | PRN

## 2011-10-22 MED ORDER — ONDANSETRON HCL 4 MG/2ML IJ SOLN
INTRAMUSCULAR | Status: DC | PRN
  Administered 2011-10-22: 4 mg via INTRAVENOUS

## 2011-10-22 MED ORDER — BUPIVACAINE LIPOSOME 1.3 % IJ SUSP
20.0000 mL | Freq: Once | INTRAMUSCULAR | Status: AC
  Administered 2011-10-22: 20 mL
  Filled 2011-10-22: qty 20

## 2011-10-22 MED ORDER — KETOROLAC TROMETHAMINE 30 MG/ML IJ SOLN
INTRAMUSCULAR | Status: DC | PRN
  Administered 2011-10-22: 30 mg via INTRAVENOUS

## 2011-10-22 MED ORDER — PROPOFOL 10 MG/ML IV BOLUS
INTRAVENOUS | Status: DC | PRN
  Administered 2011-10-22: 200 mg via INTRAVENOUS

## 2011-10-22 MED ORDER — PROMETHAZINE HCL 25 MG/ML IJ SOLN: 6.2500 mg | INTRAMUSCULAR | Status: DC | PRN

## 2011-10-22 MED ORDER — LACTATED RINGERS IV SOLN: INTRAVENOUS | Status: DC

## 2011-10-22 MED ORDER — LIDOCAINE HCL (CARDIAC) 10 MG/ML IV SOLN
INTRAVENOUS | Status: DC | PRN
  Administered 2011-10-22: 80 mg via INTRAVENOUS

## 2011-10-22 MED ORDER — CIPROFLOXACIN IN D5W 400 MG/200ML IV SOLN
INTRAVENOUS | Status: AC
Start: 2011-10-22 — End: 2011-10-22
  Filled 2011-10-22: qty 200

## 2011-10-22 MED ORDER — HYDROMORPHONE HCL PF 1 MG/ML IJ SOLN
INTRAMUSCULAR | Status: DC | PRN
  Administered 2011-10-22 (×2): 1 mg via INTRAVENOUS

## 2011-10-22 MED ORDER — METRONIDAZOLE IN NACL 5-0.79 MG/ML-% IV SOLN
INTRAVENOUS | Status: AC
  Filled 2011-10-22: qty 100

## 2011-10-22 MED ORDER — LACTATED RINGERS IV SOLN
INTRAVENOUS | Status: DC
  Administered 2011-10-22: 1000 mL via INTRAVENOUS

## 2011-10-22 MED ORDER — MEPERIDINE HCL 50 MG/ML IJ SOLN: 6.2500 mg | INTRAMUSCULAR | Status: DC | PRN

## 2011-10-22 MED ORDER — HYDROCORTISONE ACE-PRAMOXINE 2.5-1 % RE CREA: TOPICAL_CREAM | Freq: Four times a day (QID) | RECTAL | Status: DC

## 2011-10-22 MED ORDER — CIPROFLOXACIN IN D5W 400 MG/200ML IV SOLN
400.0000 mg | INTRAVENOUS | Status: AC
  Administered 2011-10-22: 400 mg via INTRAVENOUS

## 2011-10-22 MED ORDER — MIDAZOLAM HCL 5 MG/5ML IJ SOLN
INTRAMUSCULAR | Status: DC | PRN
  Administered 2011-10-22: 1 mg via INTRAVENOUS
  Administered 2011-10-22: 2 mg via INTRAVENOUS

## 2011-10-22 SURGICAL SUPPLY — 42 items
BLADE HEX COATED 2.75 (ELECTRODE) ×3 IMPLANT
BLADE SURG 15 STRL LF DISP TIS (BLADE) ×4 IMPLANT
BLADE SURG 15 STRL SS (BLADE) ×6
CANISTER SUCTION 2500CC (MISCELLANEOUS) ×3 IMPLANT
CLOTH BEACON ORANGE TIMEOUT ST (SAFETY) ×3 IMPLANT
DECANTER SPIKE VIAL GLASS SM (MISCELLANEOUS) ×3 IMPLANT
DRAIN PENROSE 18X1/2 LTX STRL (DRAIN) ×3 IMPLANT
DRAPE LAPAROTOMY T 102X78X121 (DRAPES) IMPLANT
DRAPE TABLE BACK 44X90 PK DISP (DRAPES) ×3 IMPLANT
DRSG PAD ABDOMINAL 8X10 ST (GAUZE/BANDAGES/DRESSINGS) ×1 IMPLANT
ELECT REM PT RETURN 9FT ADLT (ELECTROSURGICAL) ×3
ELECTRODE REM PT RTRN 9FT ADLT (ELECTROSURGICAL) ×2 IMPLANT
GAUZE SPONGE 4X4 16PLY XRAY LF (GAUZE/BANDAGES/DRESSINGS) ×5 IMPLANT
GLOVE BIOGEL PI IND STRL 7.0 (GLOVE) ×2 IMPLANT
GLOVE BIOGEL PI INDICATOR 7.0 (GLOVE) ×1
GLOVE ECLIPSE 8.0 STRL XLNG CF (GLOVE) ×3 IMPLANT
GLOVE INDICATOR 8.0 STRL GRN (GLOVE) ×6 IMPLANT
GOWN STRL NON-REIN LRG LVL3 (GOWN DISPOSABLE) ×3 IMPLANT
GOWN STRL REIN XL XLG (GOWN DISPOSABLE) ×6 IMPLANT
KIT BASIN OR (CUSTOM PROCEDURE TRAY) ×3 IMPLANT
LEGGING LITHOTOMY PAIR STRL (DRAPES) IMPLANT
LUBRICANT JELLY K Y 4OZ (MISCELLANEOUS) ×3 IMPLANT
NDL HYPO 25X1 1.5 SAFETY (NEEDLE) ×1 IMPLANT
NDL SAFETY ECLIPSE 18X1.5 (NEEDLE) ×2 IMPLANT
NEEDLE HYPO 18GX1.5 SHARP (NEEDLE) ×3
NEEDLE HYPO 22GX1.5 SAFETY (NEEDLE) ×3 IMPLANT
NEEDLE HYPO 25X1 1.5 SAFETY (NEEDLE) ×3 IMPLANT
NS IRRIG 1000ML POUR BTL (IV SOLUTION) ×3 IMPLANT
PACK BASIC VI WITH GOWN DISP (CUSTOM PROCEDURE TRAY) ×3 IMPLANT
PACK LITHOTOMY IV (CUSTOM PROCEDURE TRAY) ×3 IMPLANT
PENCIL BUTTON HOLSTER BLD 10FT (ELECTRODE) ×3 IMPLANT
SPONGE GAUZE 4X4 12PLY (GAUZE/BANDAGES/DRESSINGS) ×3 IMPLANT
SPONGE SURGIFOAM ABS GEL 12-7 (HEMOSTASIS) ×5 IMPLANT
SUT CHROMIC 2 0 SH (SUTURE) IMPLANT
SUT CHROMIC 3 0 SH 27 (SUTURE) IMPLANT
SUT VIC AB 2-0 SH 27 (SUTURE)
SUT VIC AB 2-0 SH 27X BRD (SUTURE) IMPLANT
SUT VIC AB 4-0 SH 18 (SUTURE) IMPLANT
SYR CONTROL 10ML LL (SYRINGE) ×3 IMPLANT
TOWEL OR 17X26 10 PK STRL BLUE (TOWEL DISPOSABLE) ×3 IMPLANT
UNDERPAD 30X30 INCONTINENT (UNDERPADS AND DIAPERS) ×3 IMPLANT
YANKAUER SUCT BULB TIP 10FT TU (MISCELLANEOUS) ×3 IMPLANT

## 2011-10-22 NOTE — Anesthesia Postprocedure Evaluation (Signed)
  Anesthesia Post-op Note  Patient: Matthew Cortez  Procedure(s) Performed: Procedure(s) (LRB): BIOPSY RECTAL (N/A)  Patient Location: PACU  Anesthesia Type: General  Level of Consciousness: awake and alert   Airway and Oxygen Therapy: Patient Spontanous Breathing  Post-op Pain: mild  Post-op Assessment: Post-op Vital signs reviewed, Patient's Cardiovascular Status Stable, Respiratory Function Stable, Patent Airway and No signs of Nausea or vomiting  Post-op Vital Signs: stable  Complications: No apparent anesthesia complications

## 2011-10-22 NOTE — Op Note (Signed)
10/22/2011  11:55 AM  PATIENT:  Matthew Cortez  51 y.o. male  Patient Care Team: Cliffton Asters, MD as PCP - General (Infectious Diseases) Cliffton Asters, MD as PCP - Infectious Diseases (Infectious Diseases) Arlice Colt as Consulting Physician (Gastroenterology) Jonna Coup, MD as Consulting Physician (Radiation Oncology) Laurice Record, MD as Consulting Physician (Medical Oncology)  PRE-OPERATIVE DIAGNOSIS:  Anal cancer s/p Hillery Aldo with persistent anal canal masses  POST-OPERATIVE DIAGNOSIS: Anal cancer s/p Hillery Aldo with persistent anal canal masses  PROCEDURE:  Procedure(s): Excisional biopsy of anal canal masses x 3 Ablation of inflammatory polyp PROCTOSCOPY  SURGEON:  Surgeon(s): Ardeth Sportsman, MD   ASSISTANT: Romie Levee, MD   ANESTHESIA:   local and general  EBL:     Delay start of Pharmacological VTE agent (>24hrs) due to surgical blood loss or risk of bleeding:  no  DRAINS: none   SPECIMEN:  Source of Specimen:  1.  R postlateral anal canal mass ?persistent CA vs scar.  2.  Post midline anal canal mass, probable hemorrhoid.  3.  Left lat anal canal mass, probable hemorrhoid  DISPOSITION OF SPECIMEN:  PATHOLOGY  COUNTS:  YES  PLAN OF CARE: Discharge to home after PACU  PATIENT DISPOSITION:  PACU - hemodynamically stable.  INDICATION: Pleasant HIV-positive man.  Found to have anal cancer.  Underwent nine grow protocol with chemoradiation therapy.  Found to have some persistent anal canal masses.  I recommended examination under anesthesia with probable biopsy vs excision of masses.  Discussed with her new partner and colorectal surgery, Dr. Maisie Fus.  The anatomy & physiology of the anorectal region was discussed.  The pathophysiology of anorectal masses and differential diagnosis was discussed.  Natural history risks without surgery was discussed such as further growth and cancer.   I stressed the importance of office follow-up to catch early  recurrence & minimize/halt progression of disease.  Interventions such as cauterization by topical agents were discussed.  The patient's symptoms are not adequately controlled by non-operative treatments.  I feel the risks & problems of no surgery outweigh the operative risks; therefore, I recommended surgery to treat the anal warts by removal, ablation and/or cauterization.  Risks such as bleeding, infection, need for further treatment, heart attack, death, and other risks were discussed.   I noted a good likelihood this will help address the problem. Goals of post-operative recovery were discussed as well.  Possibility that this will not correct all symptoms was explained.  Post-operative pain, bleeding, constipation, and other problems after surgery were discussed.  We will work to minimize complications.   Educational handouts further explaining the pathology, treatment options, and bowel regimen were given as well.  Questions were answered.  The patient expresses understanding & wishes to proceed with surgery.  OR FINDINGS: Pedunculated anal canal masses in the left lateral and posterior midline aspect.  Most consistent with an undulating prolapsing internal hemorrhoids.  Right posterior lateral area of scar and polypoid features.  Probable old scar.  Possible inflammatory polyp.  Possible persistent cancer.  Mobile.  Left posterior lateral small mass consistent with inflammatory polyp.  Bleeding.  DESCRIPTION:   Informed consent was confirmed. Patient underwent general anesthesia without difficulty. Patient was placed into lithotomy positioning.  The perianal region was prepped and draped in sterile fashion. Surgical tunnel confirmed our plan.  I did digital rectal examination and then transitioned over to anoscopy to get a sense of the anatomy.  I noted anal canal mass is as above.  From about 8:00 to 4:00 there is a posterior midline scar to bring about 2 cm from anal verge consistent with  the scarred former anal cancer.  On the right posterior aspect there was some slight polyp or nodularity.  Mobile underneath.  Not fixed to the sphincters.  The left lateral and posterior midline masses looked like classic prolapsing hemorrhoids.    The posterior midline and right posterior lateral abnormal areas I excised transversely using cautery.  I assured hemostasis.  I closed the wound transversely using interrupted 2-0 vicryl horizontal mattress stitches.  The rest the area seemed consistent with normal scar.  I went ahead and excised the left lateral prolapsing mass vertically.  I assured hemostasis.  I closed that in a running fashion using 2-0 Vicryl stitch.  I repeated anoscopy and examination.   Hemostasis was good. I placed a soft Gelfoam cylinder into the rectum. Patient is being extubated go to recovery room. I am about to discuss the patient's status to the family.

## 2011-10-22 NOTE — Anesthesia Preprocedure Evaluation (Signed)
Anesthesia Evaluation  Patient identified by MRN, date of birth, ID band Patient awake    Reviewed: Allergy & Precautions, H&P , NPO status , Patient's Chart, lab work & pertinent test results  Airway Mallampati: II TM Distance: >3 FB Neck ROM: Full    Dental No notable dental hx.    Pulmonary neg pulmonary ROS,  breath sounds clear to auscultation  Pulmonary exam normal       Cardiovascular negative cardio ROS  Rhythm:Regular Rate:Normal     Neuro/Psych negative neurological ROS  negative psych ROS   GI/Hepatic negative GI ROS, Neg liver ROS,   Endo/Other  negative endocrine ROS  Renal/GU negative Renal ROS  negative genitourinary   Musculoskeletal negative musculoskeletal ROS (+)   Abdominal   Peds negative pediatric ROS (+)  Hematology negative hematology ROS (+)   Anesthesia Other Findings   Reproductive/Obstetrics negative OB ROS                           Anesthesia Physical Anesthesia Plan  ASA: II  Anesthesia Plan: General   Post-op Pain Management:    Induction: Intravenous  Airway Management Planned: LMA and Oral ETT  Additional Equipment:   Intra-op Plan:   Post-operative Plan: Extubation in OR  Informed Consent: I have reviewed the patients History and Physical, chart, labs and discussed the procedure including the risks, benefits and alternatives for the proposed anesthesia with the patient or authorized representative who has indicated his/her understanding and acceptance.   Dental advisory given  Plan Discussed with: CRNA  Anesthesia Plan Comments:         Anesthesia Quick Evaluation

## 2011-10-22 NOTE — Transfer of Care (Signed)
Immediate Anesthesia Transfer of Care Note  Patient: Matthew Cortez  Procedure(s) Performed: Procedure(s) (LRB) with comments: BIOPSY RECTAL (N/A) PROCTOSCOPY (N/A) - Prostostomy  Patient Location: PACU  Anesthesia Type: General  Level of Consciousness: awake, alert , sedated and patient cooperative  Airway & Oxygen Therapy: Patient Spontanous Breathing and Patient connected to face mask oxygen  Post-op Assessment: Report given to PACU RN, Post -op Vital signs reviewed and stable and Patient moving all extremities X 4  Post vital signs: stable  Complications: No apparent anesthesia complications

## 2011-10-22 NOTE — H&P (Signed)
Matthew Cortez  09-Jan-1961 [id number]  CARE TEAM:  PCP: Cliffton Asters, MD  Outpatient Care Team: Patient Care Team: Cliffton Asters, MD as PCP - General (Infectious Diseases) Cliffton Asters, MD as PCP - Infectious Diseases (Infectious Diseases) Arlice Colt as Consulting Physician (Gastroenterology) Jonna Coup, MD as Consulting Physician (Radiation Oncology) Laurice Record, MD as Consulting Physician (Medical Oncology)  Inpatient Treatment Team: Treatment Team: Attending Provider: Ardeth Sportsman, MD   This patient is a 51 y.o.male who presents today for surgical evaluation.   Reason for evaluation: Followup on treatment of anal cancer.   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 He's undergone chemoradiation therapy Nigro protocol. He is feeling much better.  He worked throughout his treatments. Had some rough irritation a few weeks after completion but is now feeling much better. No incontinence to flatus or stool. Having daily bowel movements. No bleeding. No pain. Energy level feels otherwise well. He's had followup with Dr. Dalene Carrow & Dr. Mitzi Hansen. The PET scan last month showed no concerning areas.  No new events  Patient Active Problem List  Diagnosis  . HIV INFECTION  . CIGARETTE SMOKER  . CHOLELITHIASIS  . DEGENERATIVE DISC DISEASE  . BACK PAIN, LUMBAR, WITH RADICULOPATHY  . Squamous cell carcinoma of anus  . Cough    Past Medical History  Diagnosis Date  . HIV infection   . DDD (degenerative disc disease)   . Cholelithiasis   . Allergy   . History of radiation therapy 03/29/11- 05/07/11    EOT4/06/2011 anan ca, 52.2Gy  . DDD (degenerative disc disease)   . 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 Cancer 02/11/2011    squamous cell carcinoma of anus  . Anal condyloma   . Pneumonia 15 yrs ago    Past Surgical History  Procedure Date  . No past surgeries     History   Social  History  . Marital Status: Divorced    Spouse Name: N/A    Number of Children: N/A  . Years of Education: N/A   Occupational History  . Not on file.   Social History Main Topics  . Smoking status: Former Smoker -- 0.2 packs/day for 20 years    Types: Cigarettes    Quit date: 03/07/2010  . Smokeless tobacco: Never Used   Comment: off and on for 20 yrs, 4 cigs/day  . Alcohol Use: 6.0 oz/week    12 drink(s) per week     3 glasses of wine per week, none for past 2-3 mos  . Drug Use: No     quit smoking 03/2010  . Sexually Active: Not Currently     declined condoms   Other Topics Concern  . Not on file   Social History Narrative  . No narrative on file    Family History  Problem Relation Age of Onset  . Cancer Mother     abdominal tumor  . Cancer Maternal Aunt     breast  . Diabetes Brother     Current Facility-Administered Medications  Medication Dose Route Frequency Provider Last Rate Last Dose  . bupivacaine liposome (EXPAREL) 1.3 % injection 266 mg  20 mL Infiltration Once Ardeth Sportsman, MD      . ciprofloxacin (CIPRO) IVPB 400 mg  400 mg Intravenous 120 min pre-op Ardeth Sportsman, MD      . lactated ringers infusion   Intravenous Continuous  Phillips Grout, MD 100 mL/hr at 10/22/11 0944 1,000 mL at 10/22/11 0944  . metroNIDAZOLE (FLAGYL) IVPB 500 mg  500 mg Intravenous On Call to OR Ardeth Sportsman, MD         Allergies  Allergen Reactions  . Doxycycline Hives    "Burned inside my body, lips split open and bled"  . Sulfonamide Derivatives Hives    "burned inside my body, inside my mouth"  . Amoxicillin     REACTION: rash  . Nevirapine     REACTION: Stevens-Johnson syndrome  . Penicillins    Review of Systems  Constitutional: Negative for fever, chills and diaphoresis.  HENT: Negative for sore throat, trouble swallowing and neck pain.  Eyes: Negative for photophobia and visual disturbance.  Respiratory: Negative for choking and shortness of breath.    Cardiovascular: Negative for chest pain and palpitations.  Gastrointestinal: Negative for nausea, vomiting, abdominal pain, diarrhea, constipation, blood in stool, abdominal distention, anal bleeding and rectal pain.  No incontinence to flatus/stool. No encopresis  Genitourinary: Negative for dysuria, urgency, difficulty urinating and testicular pain.  Musculoskeletal: Negative for myalgias, arthralgias and gait problem.  Skin: Negative for color change and rash.  Neurological: Negative for dizziness, speech difficulty, weakness and numbness.  Hematological: Negative for adenopathy.  Psychiatric/Behavioral: Negative for hallucinations, confusion and agitation.  Objective:   Physical Exam  Constitutional: He is oriented to person, place, and time. He appears well-developed and well-nourished. No distress.  HENT:  Head: Normocephalic.  Mouth/Throat: Oropharynx is clear and moist. No oropharyngeal exudate.  Eyes: Conjunctivae and EOM are normal. Pupils are equal, round, and reactive to light. No scleral icterus.  Neck: Normal range of motion. No tracheal deviation present.  Cardiovascular: Normal rate, normal heart sounds and intact distal pulses.  Pulmonary/Chest: Effort normal. No respiratory distress.  Abdominal: Soft. He exhibits no distension and no mass. There is no tenderness. There is no guarding. Hernia confirmed negative in the right inguinal area and confirmed negative in the left inguinal area.  Incisions clean with normal healing ridges. No hernias  Genitourinary: Penis normal. No penile tenderness.   Perianal skin clean with good hygiene. No pruritis. No external skin tags / hemorrhoids of significance. Mild hyperpigmentation. No pilonidal disease. No fissure. No abscess/fistula.  Tolerates digital and anoscopic rectal exam. Normal sphincter tone. Pedunculated anal canal masses of left lateral and right posterior. Probable old hemorrhoids. Some elevated white linear horizontal  scarring Left post lateral 3cm from anal verge at old anal cancer. Mobile   Musculoskeletal: Normal range of motion. He exhibits no tenderness.  Neurological: He is alert and oriented to person, place, and time. No cranial nerve deficit. He exhibits normal muscle tone. Coordination normal.  Skin: Skin is warm and dry. No rash noted. He is not diaphoretic.  Psychiatric: He has a normal mood and affect. His behavior is normal.    BP 104/69  Pulse 62  Temp 97.1 F (36.2 C) (Oral)  Resp 18  SpO2 94%    Results:   Labs: No results found for this or any previous visit (from the past 48 hour(s)).  Imaging / Studies: No results found.  Medications / Allergies: per chart  Antibiotics: Anti-infectives     Start     Dose/Rate Route Frequency Ordered Stop   10/22/11 0822   metroNIDAZOLE (FLAGYL) IVPB 500 mg        500 mg 100 mL/hr over 60 Minutes Intravenous On call to O.R. 10/22/11 5784 10/23/11 0559   10/22/11  1610   ciprofloxacin (CIPRO) IVPB 400 mg        400 mg 200 mL/hr over 60 Minutes Intravenous 120 min pre-op 10/22/11 9604            Assessment  Herbert Pun  51 y.o. male  Day of Surgery  Procedure(s): BIOPSY RECTAL PROCTOSCOPY  Problem List:  Active Problems:  * No active hospital problems. *   Scarring & anal masses remaining s/p Nigro protocol  Plan:  I think he requires examination under anesthesia. I removed the anal canal pedunculated masses. I would like to at least biopsy if not excise the persistent elevated abnormal tissues. I discussed the case with our new colorectal surgeon, Dr. Maisie Fus. She agrees. I think this is distal enough that we do not need the TEM equipment to do this. Did not want to be initially overaggressive. I did discuss the procedure with the patient:  The anatomy & physiology of the digestive tract was discussed. The pathophysiology of the rectal pathology was discussed. Natural history risks without surgery was discussed. I feel  the risks of no intervention will lead to serious problems that outweigh the operative risks; therefore, I recommended surgery.  Laparoscopic & open abdominal techniques were discussed. I recommended we start with Removal of the anal canal masses and biopsy. Possible partial proctectomy for excisional biopsy to remove the pathology and hopefully cure and/or control the pathology. This technique can offer less operative risk and faster post-operative recovery. Possible need for immediate or later abdominal surgery for further treatment was discussed.  Risks such as bleeding, abscess, reoperation, ostomy, heart attack, death, and other risks were discussed. I noted a good likelihood this will help address the problem. Goals of post-operative recovery were discussed as well. We will work to minimize complications. An educational handout was given as well. Questions were answered. The patient expresses understanding & wishes to proceed with surgery.   Dr Maisie Fus, our new colorectal surgeon, to help assist.     -VTE prophylaxis- SCDs, etc -mobilize as tolerated to help recovery  Ardeth Sportsman, M.D., F.A.C.S. Gastrointestinal and Minimally Invasive Surgery [facility] [address] [place], [place] [address] [phone] Main / Paging [phone] Voice Mail   10/22/2011

## 2011-10-25 ENCOUNTER — Encounter (HOSPITAL_COMMUNITY): Payer: Self-pay | Admitting: Surgery

## 2011-10-26 ENCOUNTER — Telehealth (INDEPENDENT_AMBULATORY_CARE_PROVIDER_SITE_OTHER): Payer: Self-pay

## 2011-10-26 NOTE — Telephone Encounter (Signed)
Called Matthew Cortez to add pt to GI cancer conf. For AIN1 and AIN3 prior anal cancer per Dr Michaell Cowing.

## 2011-10-27 ENCOUNTER — Other Ambulatory Visit: Payer: Self-pay | Admitting: Hematology and Oncology

## 2011-11-03 ENCOUNTER — Other Ambulatory Visit: Payer: Self-pay | Admitting: Internal Medicine

## 2011-11-04 ENCOUNTER — Telehealth (INDEPENDENT_AMBULATORY_CARE_PROVIDER_SITE_OTHER): Payer: Self-pay | Admitting: General Surgery

## 2011-11-04 NOTE — Telephone Encounter (Signed)
Message copied by Liliana Cline on Thu Nov 04, 2011 12:33 PM ------      Message from: Marnette Burgess      Created: Thu Nov 04, 2011 11:26 AM      Contact: Tawanna Cooler, friend, [phone]       Calling for results, please call.

## 2011-11-04 NOTE — Telephone Encounter (Signed)
Can we call pathology to the patient?

## 2011-11-04 NOTE — Telephone Encounter (Signed)
Yes,  AIN 1-3 precancerous changes in the biopsies but NO MORE CANCER remaining .  Will probably need f/u exams/surveillance to make sure no more recurrence but we can talk about that on his f/u visit.  Overall encouraging result in that no more cancer remains

## 2011-11-04 NOTE — Telephone Encounter (Signed)
Spoke with [employer]. Made him aware there was no cancer and it will be discussed further at his follow up visit.

## 2011-11-08 ENCOUNTER — Other Ambulatory Visit (INDEPENDENT_AMBULATORY_CARE_PROVIDER_SITE_OTHER): Payer: BC Managed Care – PPO

## 2011-11-08 ENCOUNTER — Other Ambulatory Visit: Payer: Self-pay | Admitting: Internal Medicine

## 2011-11-08 ENCOUNTER — Other Ambulatory Visit: Payer: BC Managed Care – PPO

## 2011-11-08 DIAGNOSIS — Z21 Asymptomatic human immunodeficiency virus [HIV] infection status: Secondary | ICD-10-CM

## 2011-11-08 DIAGNOSIS — B20 Human immunodeficiency virus [HIV] disease: Secondary | ICD-10-CM

## 2011-11-09 LAB — COMPREHENSIVE METABOLIC PANEL
AST: 22 U/L (ref 0–37)
Albumin: 4.1 g/dL (ref 3.5–5.2)
Alkaline Phosphatase: 63 U/L (ref 39–117)
BUN: 16 mg/dL (ref 6–23)
Calcium: 8.8 mg/dL (ref 8.4–10.5)
Chloride: 103 mEq/L (ref 96–112)
Glucose, Bld: 87 mg/dL (ref 70–99)
Potassium: 4.1 mEq/L (ref 3.5–5.3)
Sodium: 138 mEq/L (ref 135–145)
Total Protein: 6.6 g/dL (ref 6.0–8.3)

## 2011-11-09 LAB — CBC WITH DIFFERENTIAL/PLATELET
HCT: 34.4 % — ABNORMAL LOW (ref 39.0–52.0)
Hemoglobin: 12.3 g/dL — ABNORMAL LOW (ref 13.0–17.0)
Lymphocytes Relative: 35 % (ref 12–46)
Monocytes Absolute: 0.4 10*3/uL (ref 0.1–1.0)
Monocytes Relative: 15 % — ABNORMAL HIGH (ref 3–12)
Neutro Abs: 1.4 10*3/uL — ABNORMAL LOW (ref 1.7–7.7)
Neutrophils Relative %: 48 % (ref 43–77)
RBC: 3.26 MIL/uL — ABNORMAL LOW (ref 4.22–5.81)
WBC: 2.9 10*3/uL — ABNORMAL LOW (ref 4.0–10.5)

## 2011-11-09 LAB — HIV-1 RNA QUANT-NO REFLEX-BLD: HIV-1 RNA Quant, Log: 3.86 {Log} — ABNORMAL HIGH (ref <1.30)

## 2011-11-09 LAB — RPR

## 2011-11-10 ENCOUNTER — Encounter (INDEPENDENT_AMBULATORY_CARE_PROVIDER_SITE_OTHER): Payer: Self-pay | Admitting: Surgery

## 2011-11-10 ENCOUNTER — Ambulatory Visit (INDEPENDENT_AMBULATORY_CARE_PROVIDER_SITE_OTHER): Payer: BC Managed Care – PPO | Admitting: Surgery

## 2011-11-10 VITALS — BP 100/68 | HR 72 | Resp 16 | Ht 74.0 in | Wt 210.0 lb

## 2011-11-10 DIAGNOSIS — C210 Malignant neoplasm of anus, unspecified: Secondary | ICD-10-CM

## 2011-11-10 DIAGNOSIS — R17 Unspecified jaundice: Secondary | ICD-10-CM

## 2011-11-10 DIAGNOSIS — D013 Carcinoma in situ of anus and anal canal: Secondary | ICD-10-CM | POA: Insufficient documentation

## 2011-11-10 DIAGNOSIS — K8020 Calculus of gallbladder without cholecystitis without obstruction: Secondary | ICD-10-CM

## 2011-11-10 NOTE — Patient Instructions (Addendum)
See the Handout(s) we gave you.  Consider surgery.  Please call our office at [phone] if you wish to schedule surgery or if you have further questions / concerns.   Cholelithiasis Cholelithiasis (also called gallstones) is a form of gallbladder disease where gallstones form in your gallbladder. The gallbladder is a non-essential organ that stores bile made in the liver, which helps digest fats. Gallstones begin as small crystals and slowly grow into stones. Gallstone pain occurs when the gallbladder spasms, and a gallstone is blocking the duct. Pain can also occur when a stone passes out of the duct.  Women are more likely to develop gallstones than men. Other factors that increase the risk of gallbladder disease are:  Having multiple pregnancies. Physicians sometimes advise removing diseased gallbladders before future pregnancies.  Obesity.  Diets heavy in fried foods and fat.  Increasing age (older than 46).  Prolonged use of medications containing male hormones.  Diabetes mellitus.  Rapid weight loss.  Family history of gallstones (heredity). SYMPTOMS  Feeling sick to your stomach (nauseous).  Abdominal pain.  Yellowing of the skin (jaundice).  Sudden pain. It may persist from several minutes to several hours.  Worsening pain with deep breathing or when jarred.  Fever.  Tenderness to the touch. In some cases, when gallstones do not move into the bile duct, people have no pain or symptoms. These are called "silent" gallstones. TREATMENT In severe cases, emergency surgery may be required. HOME CARE INSTRUCTIONS   Only take over-the-counter or prescription medicines for pain, discomfort, or fever as directed by your caregiver.  Follow a low-fat diet until seen again. Fat causes the gallbladder to contract, which can result in pain.  Follow up as instructed. Attacks are almost always recurrent and surgery is usually required for permanent treatment. SEEK  IMMEDIATE MEDICAL CARE IF:   Your pain increases and is not controlled by medications.  You have an oral temperature above 102 F (38.9 C), not controlled by medication.  You develop nausea and vomiting. MAKE SURE YOU:   Understand these instructions.  Will watch your condition.  Will get help right away if you are not doing well or get worse. Document Released: 01/14/2005 Document Revised: 04/12/2011 Document Reviewed: 03/19/2010 [facility] Patient Information 2013 [place], [place].   GETTING TO GOOD BOWEL HEALTH. Irregular bowel habits such as constipation and diarrhea can lead to many problems over time.  Having one soft bowel movement a day is the most important way to prevent further problems.  The anorectal canal is designed to handle stretching and feces to safely manage our ability to get rid of solid waste (feces, poop, stool) out of our body.  BUT, hard constipated stools can act like ripping concrete bricks and diarrhea can be a burning fire to this very sensitive area of our body, causing inflamed hemorrhoids, anal fissures, increasing risk is perirectal abscesses, abdominal pain/bloating, an making irritable bowel worse.     The goal: ONE SOFT BOWEL MOVEMENT A DAY!  To have soft, regular bowel movements:    Drink at least 8 tall glasses of water a day.     Take plenty of fiber.  Fiber is the undigested part of plant food that passes into the colon, acting s "natures broom" to encourage bowel motility and movement.  Fiber can absorb and hold large amounts of water. This results in a larger, bulkier stool, which is soft and easier to pass. Work gradually over several weeks up to 6 servings a day of  fiber (25g a day even more if needed) in the form of: o Vegetables -- Root (potatoes, carrots, turnips), leafy green (lettuce, salad greens, celery, spinach), or cooked high residue (cabbage, broccoli, etc) o Fruit -- Fresh (unpeeled skin & pulp), Dried (prunes, apricots, cherries, etc ),   or stewed ( applesauce)  o Whole grain breads, pasta, etc (whole wheat)  o Bran cereals    Bulking Agents -- This type of water-retaining fiber generally is easily obtained each day by one of the following:  o Psyllium bran -- The psyllium plant is remarkable because its ground seeds can retain so much water. This product is available as Metamucil, Konsyl, Effersyllium, Per Diem Fiber, or the less expensive generic preparation in drug and health food stores. Although labeled a laxative, it really is not a laxative.  o Methylcellulose -- This is another fiber derived from wood which also retains water. It is available as Citrucel. o Polyethylene Glycol - and "artificial" fiber commonly called Miralax or Glycolax.  It is helpful for people with gassy or bloated feelings with regular fiber o Flax Seed - a less gassy fiber than psyllium   No reading or other relaxing activity while on the toilet. If bowel movements take longer than 5 minutes, you are too constipated   AVOID CONSTIPATION.  High fiber and water intake usually takes care of this.  Sometimes a laxative is needed to stimulate more frequent bowel movements, but    Laxatives are not a good long-term solution as it can wear the colon out. o Osmotics (Milk of Magnesia, Fleets phosphosoda, Magnesium citrate, MiraLax, GoLytely) are safer than  o Stimulants (Senokot, Castor Oil, Dulcolax, Ex Lax)    o Do not take laxatives for more than 7days in a row.    IF SEVERELY CONSTIPATED, try a Bowel Retraining Program: o Do not use laxatives.  o Eat a diet high in roughage, such as bran cereals and leafy vegetables.  o Drink six (6) ounces of prune or apricot juice each morning.  o Eat two (2) large servings of stewed fruit each day.  o Take one (1) heaping tablespoon of a psyllium-based bulking agent twice a day. Use sugar-free sweetener when possible to avoid excessive calories.  o Eat a normal breakfast.  o Set aside 15 minutes after breakfast to  sit on the toilet, but do not strain to have a bowel movement.  o If you do not have a bowel movement by the third day, use an enema and repeat the above steps.    Controlling diarrhea o Switch to liquids and simpler foods for a few days to avoid stressing your intestines further. o Avoid dairy products (especially milk & ice cream) for a short time.  The intestines often can lose the ability to digest lactose when stressed. o Avoid foods that cause gassiness or bloating.  Typical foods include beans and other legumes, cabbage, broccoli, and dairy foods.  Every person has some sensitivity to other foods, so listen to our body and avoid those foods that trigger problems for you. o Adding fiber (Citrucel, Metamucil, psyllium, Miralax) gradually can help thicken stools by absorbing excess fluid and retrain the intestines to act more normally.  Slowly increase the dose over a few weeks.  Too much fiber too soon can backfire and cause cramping & bloating. o Probiotics (such as active yogurt, Align, etc) may help repopulate the intestines and colon with normal bacteria and calm down a sensitive digestive tract.  Most studies  show it to be of mild help, though, and such products can be costly. o Medicines:   Bismuth subsalicylate (ex. Kayopectate, Pepto Bismol) every 30 minutes for up to 6 doses can help control diarrhea.  Avoid if pregnant.   Loperamide (Immodium) can slow down diarrhea.  Start with two tablets (4mg  total) first and then try one tablet every 6 hours.  Avoid if you are having fevers or severe pain.  If you are not better or start feeling worse, stop all medicines and call your doctor for advice o Call your doctor if you are getting worse or not better.  Sometimes further testing (cultures, endoscopy, X-ray studies, bloodwork, etc) may be needed to help diagnose and treat the cause of the diarrhea. o

## 2011-11-10 NOTE — Progress Notes (Signed)
Subjective:     Patient ID: Matthew Cortez, male   DOB: 24-Jun-1960, 51 y.o.   MRN: [id number]  HPI  Matthew Cortez  1960/11/28 [id number]  Patient Care Team: Cliffton Asters, MD as PCP - General (Infectious Diseases) Cliffton Asters, MD as PCP - Infectious Diseases (Infectious Diseases) Arlice Colt as Consulting Physician (Gastroenterology) Jonna Coup, MD as Consulting Physician (Radiation Oncology) Laurice Record, MD as Consulting Physician (Medical Oncology)  This patient is a 51 y.o.male who presents today for surgical evaluation s/p excision of anal canal masses s/p Nigro protocol for anal cancer   REPORT OF SURGICAL PATHOLOGY FINAL DIAGNOSIS Diagnosis 1. Rectum, resection, right posterior 7 o'clock - HIGH GRADE SQUAMOUS INTRAEPITHELIAL LESION, AIN II-III (MODERATE TO SEVERE DYSPLASIA). - NO STROMAL INVASION IDENTIFIED. - INKED CAUTERIZED LATERAL MARGINS, NEGATIVE FOR DYSPLASIA OR MALIGNANCY. 2. Soft tissue mass, biopsy, rectum, posterior midline - LOW GRADE SQUAMOUS INTRAEPITHELIAL LESION, AIN-I (MILD DYSPLASIA). - POLYPOID FRAGMENTS OF COLONIC MUCOSA WITH ASSOCIATED SURFACE EROSION AND UNDERLYING STROMAL FIBROSIS. - BILATERAL MARGINS, NEGATIVE FOR DYSPLASIA OR MALIGNANCY. 3. Soft tissue mass, biopsy, rectum, left lateral - LOW GRADE SQUAMOUS INTRAEPITHELIAL LESION, AIN-I (MILD DYSPLASIA). - POLYPOID FRAGMENTS OF SQUAMOUS MUCOSA WITH UNDERLYING STROMAL FIBROSIS AND CHRONIC INFLAMMATION. - BILATERAL MARGINS, NEGATIVE FOR DYSPLASIA OR MALIGNANCY. Abigail Miyamoto MD Pathologist, Electronic Signature (Case signed 10/25/2011)  The patient comes in today by himself.  Feeling fine.  Now 2 weeks s/p surgery.  Anal bleeding gone.  Was off narcotics by POD#1, within a day.  Daily soft bowel movements.  Energy level good.  No fevers or chills.  No burning or itching.  Overall in good spirits.  He was made aware of the the pathology prior to this visit.  Patient Active Problem List    Diagnosis  . HIV INFECTION  . CIGARETTE SMOKER  . CHOLELITHIASIS  . DEGENERATIVE DISC DISEASE  . BACK PAIN, LUMBAR, WITH RADICULOPATHY  . Squamous cell carcinoma of anus  . Cough  . 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 HIV infection   . DDD (degenerative disc disease)   . Cholelithiasis   . Allergy   . History of radiation therapy 03/29/11- 05/07/11    EOT4/06/2011 anan ca, 52.2Gy  . DDD (degenerative disc disease)   . 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 Cancer 02/11/2011    squamous cell carcinoma of anus  . Anal condyloma   . Pneumonia 15 yrs ago    Past Surgical History  Procedure Date  . No past surgeries   . Rectal biopsy 10/22/2011    Procedure: BIOPSY RECTAL;  Surgeon: Ardeth Sportsman, MD;  Location: [facility];  Service: General;  Laterality: N/A;    History   Social History  . Marital Status: Divorced    Spouse Name: N/A    Number of Children: N/A  . Years of Education: N/A   Occupational History  . Not on file.   Social History Main Topics  . Smoking status: Former Smoker -- 0.2 packs/day for 20 years    Types: Cigarettes    Quit date: 03/07/2010  . Smokeless tobacco: Never Used   Comment: off and on for 20 yrs, 4 cigs/day  . Alcohol Use: 6.0 oz/week    12 drink(s) per week     3 glasses of wine per week, none for past 2-3 mos  .  Drug Use: No     quit smoking 03/2010  . Sexually Active: Not Currently     declined condoms   Other Topics Concern  . Not on file   Social History Narrative  . No narrative on file    Family History  Problem Relation Age of Onset  . Cancer Mother     abdominal tumor  . Cancer Maternal Aunt     breast  . Diabetes Brother     Current Outpatient Prescriptions  Medication Sig Dispense Refill  . acetaminophen (TYLENOL) 325 MG tablet Take 325 mg by mouth every 6 (six) hours as needed.       . dapsone 100 MG tablet Take 100 mg by mouth every morning.       . fluconazole (DIFLUCAN) 100 MG tablet Take 100 mg by mouth once a week.       . hydrocortisone-pramoxine (ANALPRAM-HC) 2.5-1 % rectal cream Place rectally 4 (four) times daily. For irritated and painful hemorrhoids  15 g  2  . NORVIR 100 MG capsule TAKE 1 CAPSULE BY MOUTH ONCE DAILY  90 capsule  3  . oxyCODONE (OXY IR/ROXICODONE) 5 MG immediate release tablet Take 1-2 tablets (5-10 mg total) by mouth every 4 (four) hours as needed for pain.  50 tablet  0  . REYATAZ 300 MG capsule TAKE 1 CAPSULE BY MOUTH ONCE DAILY  90 capsule  3  . ritonavir (NORVIR) 100 MG TABS Take 100 mg by mouth every morning.      . TRUVADA 200-300 MG per tablet TAKE 1 TABLET BY MOUTH ONCE DAILY  90 tablet  3  . zidovudine (RETROVIR) 300 MG tablet TAKE 1 TABLET BY MOUTH TWICE DAILY  120 tablet  3     Allergies  Allergen Reactions  . Doxycycline Hives    "Burned inside my body, lips split open and bled"  . Sulfonamide Derivatives Hives    "burned inside my body, inside my mouth"  . Amoxicillin     REACTION: rash  . Nevirapine     REACTION: Stevens-Johnson syndrome  . Penicillins     BP 100/68  Pulse 72  Resp 16  Ht 6\' 2"  (1.88 m)  Wt 210 lb (95.255 kg)  BMI 26.96 kg/m2  No results found.   Review of Systems  Constitutional: Negative for fever, chills and diaphoresis.  HENT: Negative for sore throat, trouble swallowing and neck pain.   Eyes: Negative for photophobia and visual disturbance.  Respiratory: Negative for choking and shortness of breath.   Cardiovascular: Negative for chest pain and palpitations.  Gastrointestinal: Negative for nausea, vomiting, abdominal distention, anal bleeding and rectal pain.  Genitourinary: Negative for dysuria, urgency, difficulty urinating and testicular pain.  Musculoskeletal: Negative for myalgias, arthralgias and gait problem.  Skin: Negative for color change and rash.  Neurological: Negative for  dizziness, speech difficulty, weakness and numbness.  Hematological: Negative for adenopathy.  Psychiatric/Behavioral: Negative for hallucinations, confusion and agitation.       Objective:   Physical Exam  Constitutional: He is oriented to person, place, and time. He appears well-developed and well-nourished. No distress.  HENT:  Head: Normocephalic.  Mouth/Throat: Oropharynx is clear and moist. No oropharyngeal exudate.  Eyes: Conjunctivae normal and EOM are normal. Pupils are equal, round, and reactive to light. No scleral icterus.  Neck: Normal range of motion. No tracheal deviation present.  Cardiovascular: Normal rate, normal heart sounds and intact distal pulses.   Pulmonary/Chest: Effort normal. No respiratory distress.  Abdominal:  Soft. He exhibits no distension. There is no tenderness. Hernia confirmed negative in the right inguinal area and confirmed negative in the left inguinal area.       Incisions clean with normal healing ridges.  No hernias  Genitourinary:       Exam done with assistance of male Medical Assistant in the room.  Perianal skin clean with good hygiene.  No pruritis.  No external skin tags / hemorrhoids of significance.  No pilonidal disease.  No fissure.  No abscess/fistula.    Tolerates digital rectal exam.  Normal sphincter tone.   Rectal closure intact.  Mild swelling c/w post-op changes.  Hemorrhoidal piles WNL   Musculoskeletal: Normal range of motion. He exhibits no tenderness.  Neurological: He is alert and oriented to person, place, and time. No cranial nerve deficit. He exhibits normal muscle tone. Coordination normal.  Skin: Skin is warm and dry. No rash noted. He is not diaphoretic.  Psychiatric: He has a normal mood and affect. His behavior is normal.       Assessment:     Anal cancer resolved after Juliane Poot.  Focus of AIN III.  Background focus of AIN I.  Symptomatic gallstones.  Milder attacks with low fat diet but persistant    Plan:       Return to clinic in three months to examine perianal region.  Examination under anesthesia at six months to make sure no evidence of recurrence.  Discussed with my colorectal partner, Dr. Romie Levee.  The anatomy & physiology of the anorectal region was discussed.  The pathophysiology of anorectal cancer & AIN and differential diagnosis was discussed.  Natural history risks without surgery was discussed such as further growth and cancer.   I stressed the importance of office follow-up to catch early recurrence & minimize/halt progression of disease.  Interventions such as cauterization by topical agents were discussed.  The patient's symptoms are not adequately controlled by non-operative treatments.  I feel the risks & problems of no surgery outweigh the operative risks; therefore, I recommended surgery to rule out recurrence of anal pathology & possibly treat by removal, ablation and/or cauterization.  Risks such as bleeding, infection, need for further treatment, heart attack, death, and other risks were discussed.   I noted a good likelihood this will help address the problem. Goals of post-operative recovery were discussed as well.  Possibility that this will not correct all symptoms was explained.  Post-operative pain, bleeding, constipation, and other problems after surgery were discussed.  We will work to minimize complications.   Educational handouts further explaining the pathology, treatment options, and bowel regimen were given as well.  Questions were answered.  The patient expresses understanding & wishes to proceed with surgery.    Because he does have gallstone and has had one major and at least four milder attacks, I think he will need cholecystectomy.  Had he brought this up before the surgery, I would have done it at the same time.  Perhaps he can wait until the six-month followup UA.  However, if he gets more intense or frequent attacks, consider surgery sooner.  He was hesitant  to schedule anything right now and will think about things.  I think he is a reasonable single site candidate.  I did discuss the procedure with him:  The anatomy & physiology of hepatobiliary & pancreatic function was discussed.  The pathophysiology of gallbladder dysfunction was discussed.  Natural history risks without surgery was discussed.   I feel the  risks of no intervention will lead to serious problems that outweigh the operative risks; therefore, I recommended cholecystectomy to remove the pathology.  I explained laparoscopic techniques with possible need for an open approach.  Probable cholangiogram to evaluate the bilary tract was explained as well.    Risks such as bleeding, infection, abscess, leak, injury to other organs, need for further treatment, heart attack, death, and other risks were discussed.  I noted a good likelihood this will help address the problem.  Possibility that this will not correct all abdominal symptoms was explained.  Goals of post-operative recovery were discussed as well.  We will work to minimize complications.  An educational handout further explaining the pathology and treatment options was given as well.  Questions were answered.  The patient expresses understanding & wishes to proceed with surgery.

## 2011-11-22 ENCOUNTER — Encounter: Payer: Self-pay | Admitting: Internal Medicine

## 2011-11-22 ENCOUNTER — Ambulatory Visit (INDEPENDENT_AMBULATORY_CARE_PROVIDER_SITE_OTHER): Payer: BC Managed Care – PPO | Admitting: Internal Medicine

## 2011-11-22 VITALS — BP 126/78 | HR 64 | Temp 97.7°F | Ht 74.0 in | Wt 212.8 lb

## 2011-11-22 DIAGNOSIS — Z23 Encounter for immunization: Secondary | ICD-10-CM

## 2011-11-22 DIAGNOSIS — B20 Human immunodeficiency virus [HIV] disease: Secondary | ICD-10-CM

## 2011-11-22 NOTE — Progress Notes (Signed)
Patient ID: Matthew Cortez, male   DOB: 1960-12-29, 51 y.o.   MRN: [id number]     [facility] for Infectious Disease  Patient Active Problem List  Diagnosis  . HIV INFECTION  . CIGARETTE SMOKER  . CHOLELITHIASIS  . DEGENERATIVE DISC DISEASE  . BACK PAIN, LUMBAR, WITH RADICULOPATHY  . Squamous cell carcinoma of anus  . Cough  . 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 Hyperbilirubinemia, Tbili 2-2.5    Patient's Medications  New Prescriptions   No medications on file  Previous Medications   ACETAMINOPHEN (TYLENOL) 325 MG TABLET    Take 325 mg by mouth every 6 (six) hours as needed.   DAPSONE 100 MG TABLET    Take 100 mg by mouth every morning.    FLUCONAZOLE (DIFLUCAN) 100 MG TABLET    Take 100 mg by mouth once a week.    PROMETHAZINE (PHENERGAN) 25 MG TABLET    Take 25 mg by mouth every 6 (six) hours as needed.   REYATAZ 300 MG CAPSULE    TAKE 1 CAPSULE BY MOUTH ONCE DAILY   RITONAVIR (NORVIR) 100 MG TABS    Take 100 mg by mouth every morning.   TRUVADA 200-300 MG PER TABLET    TAKE 1 TABLET BY MOUTH ONCE DAILY   ZIDOVUDINE (RETROVIR) 300 MG TABLET    TAKE 1 TABLET BY MOUTH TWICE DAILY  Modified Medications   No medications on file  Discontinued Medications   HYDROCORTISONE-PRAMOXINE (ANALPRAM-HC) 2.5-1 % RECTAL CREAM    Place rectally 4 (four) times daily. For irritated and painful hemorrhoids   NORVIR 100 MG CAPSULE    TAKE 1 CAPSULE BY MOUTH ONCE DAILY   OXYCODONE (OXY IR/ROXICODONE) 5 MG IMMEDIATE RELEASE TABLET    Take 1-2 tablets (5-10 mg total) by mouth every 4 (four) hours as needed for pain.    Subjective: Matthew Cortez is in for his routine visit. He denies missing any doses of his HIV medications since his last visit. He states that work has been very stressful as a Production designer, theatre/television/film at [employer] He denies any current medical problems.  Objective: Temp: 97.7 F (36.5 C) (10/21 1610) Temp src: Oral  (10/21 1610) BP: 126/78 mmHg (10/21 1610) Pulse Rate: 64  (10/21 1610)  General: his weight is stable and he is in good spirits Oral: Clear Skin: no rash Lungs: clear Cor: regular S1 and S2 no murmurs  Lab Results HIV 1 RNA Quant (copies/mL)  Date Value  11/08/2011 7180*  08/24/2011 1490*  05/18/2011 1325*     CD4 T Cell Abs (cmm)  Date Value  11/08/2011 50*  08/24/2011 40*  05/18/2011 30*     Assessment: Matthew Cortez's viral load is rising on therapy. No significant mutations were noted on a genotype resistance test earlier this year but I will repeat that again today to see if he has developed resistance to his current regimen.  Plan: 1. Continue current antiretrovirals 2. Repeat genotype resistance test 3. Followup in 3-4 weeks   Cliffton Asters, MD [facility] for Infectious Disease [facility] [phone] pager   [phone] cell 11/22/2011, 4:25 PM

## 2011-11-29 LAB — HIV-1 GENOTYPR PLUS

## 2011-12-08 ENCOUNTER — Other Ambulatory Visit (HOSPITAL_BASED_OUTPATIENT_CLINIC_OR_DEPARTMENT_OTHER): Payer: BC Managed Care – PPO

## 2011-12-08 DIAGNOSIS — C210 Malignant neoplasm of anus, unspecified: Secondary | ICD-10-CM

## 2011-12-08 LAB — COMPREHENSIVE METABOLIC PANEL (CC13)
Albumin: 3.9 g/dL (ref 3.5–5.0)
Alkaline Phosphatase: 78 U/L (ref 40–150)
BUN: 19 mg/dL (ref 7.0–26.0)
CO2: 29 mEq/L (ref 22–29)
Calcium: 9 mg/dL (ref 8.4–10.4)
Chloride: 106 mEq/L (ref 98–107)
Glucose: 97 mg/dl (ref 70–99)
Potassium: 4.4 mEq/L (ref 3.5–5.1)
Sodium: 139 mEq/L (ref 136–145)
Total Protein: 7.1 g/dL (ref 6.4–8.3)

## 2011-12-08 LAB — CBC WITH DIFFERENTIAL/PLATELET
Basophils Absolute: 0 10*3/uL (ref 0.0–0.1)
Eosinophils Absolute: 0.1 10*3/uL (ref 0.0–0.5)
HGB: 13.8 g/dL (ref 13.0–17.1)
MCV: 113.7 fL — ABNORMAL HIGH (ref 79.3–98.0)
MONO#: 0.5 10*3/uL (ref 0.1–0.9)
NEUT#: 1.4 10*3/uL — ABNORMAL LOW (ref 1.5–6.5)
RBC: 3.38 10*6/uL — ABNORMAL LOW (ref 4.20–5.82)
RDW: 14.3 % (ref 11.0–14.6)
WBC: 3.5 10*3/uL — ABNORMAL LOW (ref 4.0–10.3)

## 2011-12-10 ENCOUNTER — Encounter: Payer: Self-pay | Admitting: Hematology and Oncology

## 2011-12-10 ENCOUNTER — Telehealth: Payer: Self-pay | Admitting: Hematology and Oncology

## 2011-12-10 ENCOUNTER — Ambulatory Visit (HOSPITAL_BASED_OUTPATIENT_CLINIC_OR_DEPARTMENT_OTHER): Payer: BC Managed Care – PPO | Admitting: Hematology and Oncology

## 2011-12-10 VITALS — BP 115/67 | HR 58 | Temp 97.0°F | Resp 20 | Ht 74.0 in | Wt 215.9 lb

## 2011-12-10 DIAGNOSIS — B20 Human immunodeficiency virus [HIV] disease: Secondary | ICD-10-CM

## 2011-12-10 DIAGNOSIS — C210 Malignant neoplasm of anus, unspecified: Secondary | ICD-10-CM

## 2011-12-10 NOTE — Telephone Encounter (Signed)
appts made and printed for pt,pt aware that [facility] will call with scan appts,contrast given to pt      aom

## 2011-12-10 NOTE — Telephone Encounter (Signed)
l/m for pt to call about ref     anne

## 2011-12-10 NOTE — Progress Notes (Signed)
CC:   Cliffton Asters, M.D. Ardeth Sportsman, MD  IDENTIFYING STATEMENT:  The patient is a 51 year old man with anal cancer who presents for followup.  INTERVAL HISTORY:  Mr. Fluegge presents with no current concerns or complaints.  He denies pelvic pain.  He has not lost weight.  He denies change in bowel function.  He is without nausea, vomiting.  Last month he had seen Dr. Michaell Cowing, who performed an anal exam, noting a high-grade squamous intraepithelial lesion, AIN 2-3 (moderate to severe dysplasia, which was excised).  Of note, there was no stromal invasion identified. Ink margins were negative for dysplasia and malignancy.  Additional lesions were seen in the posterior midline and left lateral rectum.  MEDICATIONS:  Reviewed and updated.  PHYSICAL EXAMINATION:  Patient is alert and oriented x3.  Vitals:  Pulse 58, blood pressure 115/67, temperature 97, respirations 20.  Weight is at 115 pounds.  HEENT:  Head is atraumatic, normocephalic.  Sclerae anicteric.  Mouth moist.  Chest:  Clear.  CVS:  Unremarkable.  Abdomen: Soft, nontender.  No masses.  Bowel sounds are present.  Extremities: No calf tenderness.  LAB DATA:  On 12/08/2011, white cell count 3.5, hemoglobin 13.8, hematocrit 38.4, platelets 173.  Sodium 139, potassium 4.4, chloride 106, CO2 29, BUN 19, creatinine 1, total bilirubin 0.6,  alkaline phosphatase 78, AST 22, ALT 30, calcium 9.  IMPRESSION/PLAN:  Mr. 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  He is status post concurrent radiation therapy with mitomycin and 5-FU from 03/09/2011 through 05/07/2011.  A recent anal evaluation noted several areas of anal intraepithelial neoplasia grade 1-3, which were excised.  Dr. Michaell Cowing plans an evaluation in 3 months' time.  From my standpoint, I would like for him to follow up  in 4 months' time and at that time, I will be obtaining a CT scan of the abdomen and pelvis. He has also been referred for a colonoscopy.    ______________________________ Laurice Record, M.D. LIO/MEDQ  D:  12/10/2011  T:  12/10/2011  Job:  [id number]

## 2011-12-10 NOTE — Progress Notes (Signed)
This office note has been dictated.

## 2011-12-10 NOTE — Patient Instructions (Addendum)
Matthew Cortez  [id number]   [facility] - AFTER VISIT SUMMARY   **RECOMMENDATIONS MADE BY THE CONSULTANT AND ANY TEST    RESULTS WILL BE SENT TO YOUR REFERRING DOCTORS.   YOUR EXAM FINDINGS, LABS AND RESULTS WERE DISCUSSED BY YOUR MD TODAY.  YOU CAN GO TO THE [facility] WEB SITE FOR INSTRUCTIONS ON HOW TO ASSESS MY CHART FOR ADDITIONAL INFORMATION AS NEEDED.  Your Updated drug allergies are: Allergies as of 12/10/2011 - Review Complete 11/22/2011  Allergen Reaction Noted  . Doxycycline Hives   . Sulfonamide derivatives Hives   . Amoxicillin  10/05/2007  . Nevirapine    . Penicillins      Your current list of medications are: Current Outpatient Prescriptions  Medication Sig Dispense Refill  . acetaminophen (TYLENOL) 325 MG tablet Take 325 mg by mouth every 6 (six) hours as needed.      . dapsone 100 MG tablet Take 100 mg by mouth every morning.       . fluconazole (DIFLUCAN) 100 MG tablet Take 100 mg by mouth once a week.       . promethazine (PHENERGAN) 25 MG tablet Take 25 mg by mouth every 6 (six) hours as needed.      . REYATAZ 300 MG capsule TAKE 1 CAPSULE BY MOUTH ONCE DAILY  90 capsule  3  . ritonavir (NORVIR) 100 MG TABS Take 100 mg by mouth every morning.      . TRUVADA 200-300 MG per tablet TAKE 1 TABLET BY MOUTH ONCE DAILY  90 tablet  3  . zidovudine (RETROVIR) 300 MG tablet TAKE 1 TABLET BY MOUTH TWICE DAILY  120 tablet  3     INSTRUCTIONS GIVEN AND DISCUSSED:  See attached schedule   SPECIAL INSTRUCTIONS/FOLLOW-UP:  See above.  I acknowledge that I have been informed and understand all the instructions given to me and received a copy.I know to contact the clinic, my physician, or go to the emergency Department if any problems should occur.   I do not have any more questions at this time, but understand that I may call the [facility] at [phone] during business hours should I have any further questions or need assistance in  obtaining follow-up care.

## 2011-12-22 ENCOUNTER — Encounter: Payer: Self-pay | Admitting: Internal Medicine

## 2011-12-22 ENCOUNTER — Ambulatory Visit (INDEPENDENT_AMBULATORY_CARE_PROVIDER_SITE_OTHER): Payer: BC Managed Care – PPO | Admitting: Internal Medicine

## 2011-12-22 VITALS — BP 127/86 | HR 52 | Temp 97.7°F | Ht 74.0 in | Wt 215.0 lb

## 2011-12-22 DIAGNOSIS — B20 Human immunodeficiency virus [HIV] disease: Secondary | ICD-10-CM

## 2011-12-22 NOTE — Progress Notes (Signed)
Patient ID: Matthew Cortez, male   DOB: [birth year], 51 y.o.   MRN: [id number]     [facility] for Infectious Disease  Patient Active Problem List  Diagnosis  . HIV INFECTION  . CIGARETTE SMOKER  . CHOLELITHIASIS  . DEGENERATIVE DISC DISEASE  . BACK PAIN, LUMBAR, WITH RADICULOPATHY  . Squamous cell carcinoma of anus  . Cough  . 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 Hyperbilirubinemia, Tbili 2-2.5    Patient's Medications  New Prescriptions   No medications on file  Previous Medications   DAPSONE 100 MG TABLET    Take 100 mg by mouth every morning.    FLUCONAZOLE (DIFLUCAN) 100 MG TABLET    Take 100 mg by mouth once a week.    PROMETHAZINE (PHENERGAN) 25 MG TABLET    Take 25 mg by mouth every 6 (six) hours as needed.   REYATAZ 300 MG CAPSULE    TAKE 1 CAPSULE BY MOUTH ONCE DAILY   RITONAVIR (NORVIR) 100 MG TABS    Take 100 mg by mouth every morning.   TRUVADA 200-300 MG PER TABLET    TAKE 1 TABLET BY MOUTH ONCE DAILY   ZIDOVUDINE (RETROVIR) 300 MG TABLET    TAKE 1 TABLET BY MOUTH TWICE DAILY  Modified Medications   No medications on file  Discontinued Medications   No medications on file    Subjective: Kyrin is in for his routine visit. He denies missing a single dose of his medications since his last visit last month. He continues to use his pillbox. He is feeling better, in large part due to the fact that his work is becoming less stressful and he plans to take some time off over the holidays.  Objective: Temp: 97.7 F (36.5 C) (11/20 1632) Temp src: Oral (11/20 1632) BP: 127/86 mmHg (11/20 1632) Pulse Rate: 52  (11/20 1632)  General: He is in good spirits Skin: No rash Lungs: Clear Cor: Regular S1 and S2 no murmurs  Lab Results HIV 1 RNA Quant (copies/mL)  Date Value  11/08/2011 7180*  08/24/2011 1490*  05/18/2011 1325*     CD4 T Cell Abs (cmm)  Date Value  11/08/2011 50*    08/24/2011 40*  05/18/2011 30*     Assessment: His genotype resistance testing in April did not reveal any significant resistance mutations. 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 However, this should not significantly weak in his regimen as is virus was still susceptible to zidovudine, atazanavir and ritonavir. He does have some intermittent nausea but no vomiting and is not on any other medicines that could lead to decreased absorption of his medications or increase metabolism. I will continue his current regimen for now and repeat lab work today. If his viral load remains elevated I will consider a new salvage regimen.  Plan: 1. Continue current antiretroviral therapy 2. Check CD4, viral load and HLA B. 5701. I will do further resistance testing his viral load remains elevated. 3. Followup in one month   Cliffton Asters, MD [facility] for Infectious Disease [facility] [phone] pager   [phone] cell 12/22/2011, 4:51 PM

## 2011-12-24 LAB — T-HELPER CELL (CD4) - (RCID CLINIC ONLY): CD4 % Helper T Cell: 6 % — ABNORMAL LOW (ref 33–55)

## 2011-12-25 LAB — HIV-1 RNA QUANT-NO REFLEX-BLD
HIV 1 RNA Quant: 59 copies/mL — ABNORMAL HIGH (ref <20)
HIV-1 RNA Quant, Log: 1.77 {Log} — ABNORMAL HIGH (ref <1.30)

## 2011-12-31 LAB — HLA B*5701: HLA-B*5701: NEGATIVE

## 2012-01-03 ENCOUNTER — Telehealth: Payer: Self-pay | Admitting: Hematology and Oncology

## 2012-01-03 NOTE — Telephone Encounter (Signed)
l/m  to call regarding the ref for the gi dr      Thurston Hole

## 2012-01-05 ENCOUNTER — Telehealth: Payer: Self-pay | Admitting: Hematology and Oncology

## 2012-01-05 NOTE — Telephone Encounter (Signed)
lm with appt with [facility] on 12/10 at 12:45

## 2012-02-03 ENCOUNTER — Ambulatory Visit: Payer: BC Managed Care – PPO | Admitting: Internal Medicine

## 2012-02-09 ENCOUNTER — Encounter (INDEPENDENT_AMBULATORY_CARE_PROVIDER_SITE_OTHER): Payer: Self-pay | Admitting: Surgery

## 2012-02-09 ENCOUNTER — Encounter: Payer: Self-pay | Admitting: Internal Medicine

## 2012-02-09 ENCOUNTER — Ambulatory Visit (INDEPENDENT_AMBULATORY_CARE_PROVIDER_SITE_OTHER): Payer: BC Managed Care – PPO | Admitting: Surgery

## 2012-02-09 ENCOUNTER — Ambulatory Visit (INDEPENDENT_AMBULATORY_CARE_PROVIDER_SITE_OTHER): Payer: BC Managed Care – PPO | Admitting: Internal Medicine

## 2012-02-09 VITALS — BP 126/80 | HR 63 | Temp 97.6°F | Ht 74.0 in | Wt 219.5 lb

## 2012-02-09 VITALS — BP 124/88 | HR 60 | Temp 98.5°F | Resp 14 | Ht 74.0 in | Wt 218.8 lb

## 2012-02-09 DIAGNOSIS — R17 Unspecified jaundice: Secondary | ICD-10-CM

## 2012-02-09 DIAGNOSIS — K8020 Calculus of gallbladder without cholecystitis without obstruction: Secondary | ICD-10-CM

## 2012-02-09 DIAGNOSIS — C210 Malignant neoplasm of anus, unspecified: Secondary | ICD-10-CM

## 2012-02-09 DIAGNOSIS — B20 Human immunodeficiency virus [HIV] disease: Secondary | ICD-10-CM

## 2012-02-09 DIAGNOSIS — D013 Carcinoma in situ of anus and anal canal: Secondary | ICD-10-CM

## 2012-02-09 NOTE — Progress Notes (Signed)
Patient ID: Matthew Cortez, male   DOB: 06/05/1960, 52 y.o.   MRN: [id number]     [facility] for Infectious Disease  Patient Active Problem List  Diagnosis  . HIV INFECTION  . CIGARETTE SMOKER  . Chronic cholecystitis with calculus  . DEGENERATIVE DISC DISEASE  . BACK PAIN, LUMBAR, WITH RADICULOPATHY  . Squamous cell carcinoma of anus  . Cough  . 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 Hyperbilirubinemia, Tbili 2-2.5    Patient's Medications  New Prescriptions   No medications on file  Previous Medications   DAPSONE 100 MG TABLET    Take 100 mg by mouth every morning.    FLUCONAZOLE (DIFLUCAN) 100 MG TABLET    Take 100 mg by mouth once a week.    PROMETHAZINE (PHENERGAN) 25 MG TABLET    Take 25 mg by mouth every 6 (six) hours as needed.   REYATAZ 300 MG CAPSULE    TAKE 1 CAPSULE BY MOUTH ONCE DAILY   RITONAVIR (NORVIR) 100 MG TABS    Take 100 mg by mouth every morning.   TRUVADA 200-300 MG PER TABLET    TAKE 1 TABLET BY MOUTH ONCE DAILY   ZIDOVUDINE (RETROVIR) 300 MG TABLET    TAKE 1 TABLET BY MOUTH TWICE DAILY  Modified Medications   No medications on file  Discontinued Medications   No medications on file    Subjective: Matthew Cortez is in for his routine visit. He is feeling better. He denies missing any of his medications since his last visit.  Objective: Temp: 97.6 F (36.4 C) (01/08 1542) Temp src: Oral (01/08 1542) BP: 126/80 mmHg (01/08 1542) Pulse Rate: 63  (01/08 1542)  General: He appears healthy and in good spirits Skin: No rash Lungs: Clear Cor: Regular S1 and S2 with no murmurs  Lab Results HIV 1 RNA Quant (copies/mL)  Date Value  12/22/2011 59*  11/08/2011 7180*  08/24/2011 1490*     CD4 T Cell Abs (cmm)  Date Value  12/22/2011 60*  11/08/2011 50*  08/24/2011 40*     Assessment: His HIV infection is coming under better control.  Plan: 1. Continue current  antiretroviral therapy 2. Followup after lab work in 3 months   Cliffton Asters, MD [facility] for Infectious Disease [facility] [phone] pager   [phone] cell 02/09/2012, 4:47 PM

## 2012-02-09 NOTE — Progress Notes (Signed)
Subjective:     Patient ID: Matthew Cortez, male   DOB: 1960-06-13, 52 y.o.   MRN: [id number]  HPI   Jaser Fullen  1960/07/21 [id number]  Patient Care Team: Cliffton Asters, MD as PCP - General (Infectious Diseases) Cliffton Asters, MD as PCP - Infectious Diseases (Infectious Diseases) Arlice Colt as Consulting Physician (Gastroenterology) Jonna Coup, MD as Consulting Physician (Radiation Oncology) Laurice Record, MD as Consulting Physician (Medical Oncology)  This patient is a 52 y.o.male who presents today for surgical evaluation s/p excision of AIN 1-3 anal canal masses s/p Nigro protocol for anal cancer   REPORT OF SURGICAL PATHOLOGY FINAL DIAGNOSIS Diagnosis 1. Rectum, resection, right posterior 7 o'clock - HIGH GRADE SQUAMOUS INTRAEPITHELIAL LESION, AIN II-III (MODERATE TO SEVERE DYSPLASIA). - NO STROMAL INVASION IDENTIFIED. - INKED CAUTERIZED LATERAL MARGINS, NEGATIVE FOR DYSPLASIA OR MALIGNANCY. 2. Soft tissue mass, biopsy, rectum, posterior midline - LOW GRADE SQUAMOUS INTRAEPITHELIAL LESION, AIN-I (MILD DYSPLASIA). - POLYPOID FRAGMENTS OF COLONIC MUCOSA WITH ASSOCIATED SURFACE EROSION AND UNDERLYING STROMAL FIBROSIS. - BILATERAL MARGINS, NEGATIVE FOR DYSPLASIA OR MALIGNANCY. 3. Soft tissue mass, biopsy, rectum, left lateral - LOW GRADE SQUAMOUS INTRAEPITHELIAL LESION, AIN-I (MILD DYSPLASIA). - POLYPOID FRAGMENTS OF SQUAMOUS MUCOSA WITH UNDERLYING STROMAL FIBROSIS AND CHRONIC INFLAMMATION. - BILATERAL MARGINS, NEGATIVE FOR DYSPLASIA OR MALIGNANCY. Abigail Miyamoto MD Pathologist, Electronic Signature (Case signed 10/25/2011)  The patient comes in today by himself.  Feeling fine.  Is wondering if his medical oncologist should be since his current one has left.   Daily soft bowel movements.  Energy level good.  No fevers or chills.  No burning or itching.  Overall in good spirits.  He is concerned about his gallbladder.  He had dose for series attacks a few years ago.   None since although he does say he gets some discomfort and bloating from time to time.  He feels he is ready to consider cholecystectomy now.  Patient Active Problem List  Diagnosis  . HIV INFECTION  . CIGARETTE SMOKER  . Chronic cholecystitis with calculus  . DEGENERATIVE DISC DISEASE  . BACK PAIN, LUMBAR, WITH RADICULOPATHY  . Squamous cell carcinoma of anus  . Cough  . 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 Hyperbilirubinemia, Tbili 2-2.5    Past Medical History  Diagnosis Date  . HIV infection   . DDD (degenerative disc disease)   . Cholelithiasis   . Allergy   . History of radiation therapy 03/29/11- 05/07/11    EOT4/06/2011 anan ca, 52.2Gy  . DDD (degenerative disc disease)   . 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 Cancer 02/11/2011    squamous cell carcinoma of anus  . Anal condyloma   . Pneumonia 15 yrs ago    Past Surgical History  Procedure Date  . No past surgeries   . Rectal biopsy 10/22/2011    Procedure: BIOPSY RECTAL;  Surgeon: Ardeth Sportsman, MD;  Location: [facility];  Service: General;  Laterality: N/A;    History   Social History  . Marital Status: Divorced    Spouse Name: N/A    Number of Children: N/A  . Years of Education: N/A   Occupational History  . Not on file.   Social History Main Topics  . Smoking status: Former Smoker -- 0.2 packs/day for 20 years    Types: Cigarettes    Quit date: 03/07/2010  . Smokeless tobacco: Never Used  Comment: off and on for 20 yrs, 4 cigs/day  . Alcohol Use: 6.0 oz/week    12 drink(s) per week     Comment: 3 glasses of wine per week, none for past 2-3 mos  . Drug Use: No     Comment: quit smoking 03/2010  . Sexually Active: Not Currently     Comment: declined condoms   Other Topics Concern  . Not on file   Social History Narrative  . No narrative on file    Family History  Problem Relation Age of Onset  .  Cancer Mother     abdominal tumor  . Cancer Maternal Aunt     breast  . Diabetes Brother     Current Outpatient Prescriptions  Medication Sig Dispense Refill  . dapsone 100 MG tablet Take 100 mg by mouth every morning.       . fluconazole (DIFLUCAN) 100 MG tablet Take 100 mg by mouth once a week.       . promethazine (PHENERGAN) 25 MG tablet Take 25 mg by mouth every 6 (six) hours as needed.      . REYATAZ 300 MG capsule TAKE 1 CAPSULE BY MOUTH ONCE DAILY  90 capsule  3  . ritonavir (NORVIR) 100 MG TABS Take 100 mg by mouth every morning.      . TRUVADA 200-300 MG per tablet TAKE 1 TABLET BY MOUTH ONCE DAILY  90 tablet  3  . zidovudine (RETROVIR) 300 MG tablet TAKE 1 TABLET BY MOUTH TWICE DAILY  120 tablet  3     Allergies  Allergen Reactions  . Doxycycline Hives    "Burned inside my body, lips split open and bled"  . Sulfonamide Derivatives Hives    "burned inside my body, inside my mouth"  . Amoxicillin     REACTION: rash  . Nevirapine     REACTION: Stevens-Johnson syndrome  . Penicillins     BP 124/88  Pulse 60  Temp 98.5 F (36.9 C) (Temporal)  Resp 14  Ht 6\' 2"  (1.88 m)  Wt 218 lb 12.8 oz (99.247 kg)  BMI 28.09 kg/m2  No results found.   Review of Systems  Constitutional: Negative for fever, chills and diaphoresis.  HENT: Negative for sore throat, trouble swallowing and neck pain.   Eyes: Negative for photophobia and visual disturbance.  Respiratory: Negative for choking and shortness of breath.   Cardiovascular: Negative for chest pain and palpitations.  Gastrointestinal: Negative for nausea, vomiting, abdominal distention, anal bleeding and rectal pain.  Genitourinary: Negative for dysuria, urgency, difficulty urinating and testicular pain.  Musculoskeletal: Negative for myalgias, arthralgias and gait problem.  Skin: Negative for color change and rash.  Neurological: Negative for dizziness, speech difficulty, weakness and numbness.  Hematological: Negative  for adenopathy.  Psychiatric/Behavioral: Negative for hallucinations, confusion and agitation.       Objective:   Physical Exam  Constitutional: He is oriented to person, place, and time. He appears well-developed and well-nourished. No distress.  HENT:  Head: Normocephalic.  Mouth/Throat: Oropharynx is clear and moist. No oropharyngeal exudate.  Eyes: Conjunctivae normal and EOM are normal. Pupils are equal, round, and reactive to light. No scleral icterus.  Neck: Normal range of motion. No tracheal deviation present.  Cardiovascular: Normal rate, normal heart sounds and intact distal pulses.   Pulmonary/Chest: Effort normal. No respiratory distress.  Abdominal: Soft. He exhibits no distension. There is no tenderness. Hernia confirmed negative in the right inguinal area and confirmed negative in the  left inguinal area.       Incisions clean with normal healing ridges.  No hernias  Genitourinary:       Exam done with assistance of male Medical Assistant in the room.  Perianal skin clean with good hygiene.  No pruritis.  No external skin tags / hemorrhoids of significance.  No pilonidal disease.  No fissure.  No abscess/fistula.    Tolerates digital & anoscopic rectal exam.  Normal sphincter tone.  Normal healing perianally.  No significant scarring.  No stricture.  No epithelial changes abnormal.  Normal anal crypts  No concerning masses.  No evidence of recurrent disease   Musculoskeletal: Normal range of motion. He exhibits no tenderness.  Neurological: He is alert and oriented to person, place, and time. No cranial nerve deficit. He exhibits normal muscle tone. Coordination normal.  Skin: Skin is warm and dry. No rash noted. He is not diaphoretic.  Psychiatric: He has a normal mood and affect. His behavior is normal.       Assessment:     Anal cancer resolved after Juliane Poot.  Focus of AIN III.  Background focus of AIN I.  No evidence of recurrent disease  Symptomatic gallstones.   Milder attacks with low fat diet but persistant    Plan:     Return to clinic in three months to examine perianal region.  Examination under anesthesia at six months and then annually  to make sure no evidence of recurrence.  Discussed with my colorectal partner, Dr. Romie Levee.  The anatomy & physiology of the anorectal region was discussed.  The pathophysiology of anorectal cancer & AIN and differential diagnosis was discussed.  Natural history risks without surgery was discussed such as further growth and cancer.   I stressed the importance of office follow-up to catch early recurrence & minimize/halt progression of disease.  Interventions such as cauterization by topical agents were discussed.  The patient's symptoms are not adequately controlled by non-operative treatments.  I feel the risks & problems of no surgery outweigh the operative risks; therefore, I recommended surgery to rule out recurrence of anal pathology & possibly treat by removal, ablation and/or cauterization.  Risks such as bleeding, infection, need for further treatment, heart attack, death, and other risks were discussed.   I noted a good likelihood this will help address the problem. Goals of post-operative recovery were discussed as well.  Possibility that this will not correct all symptoms was explained.  Post-operative pain, bleeding, constipation, and other problems after surgery were discussed.  We will work to minimize complications.   Educational handouts further explaining the pathology, treatment options, and bowel regimen were given as well.  Questions were answered.  The patient expresses understanding & wishes to proceed with surgery.  Keep appointment for her annual colonoscopy.  Because he does have gallstone and has had one major and at least four milder attacks, I think he will need cholecystectomy.  There is not been any urgency but the patient is now ready.  I think he is a reasonable single site candidate.   I did discuss the procedure with him:  The anatomy & physiology of hepatobiliary & pancreatic function was discussed.  The pathophysiology of gallbladder dysfunction was discussed.  Natural history risks without surgery was discussed.   I feel the risks of no intervention will lead to serious problems that outweigh the operative risks; therefore, I recommended cholecystectomy to remove the pathology.  I explained laparoscopic techniques with possible need for an open  approach.  Probable cholangiogram to evaluate the bilary tract was explained as well.    Risks such as bleeding, infection, abscess, leak, injury to other organs, need for further treatment, heart attack, death, and other risks were discussed.  I noted a good likelihood this will help address the problem.  Possibility that this will not correct all abdominal symptoms was explained.  Goals of post-operative recovery were discussed as well.  We will work to minimize complications.  An educational handout further explaining the pathology and treatment options was given as well.  Questions were answered.  The patient expresses understanding & wishes to proceed with surgery.

## 2012-02-09 NOTE — Patient Instructions (Signed)
See the Handout(s) we gave you.  Consider surgery.  Please call our office at [phone] if you wish to schedule surgery or if you have further questions / concerns.   Cholecystitis Cholecystitis is an inflammation of your gallbladder. It is usually caused by a buildup of gallstones or sludge (cholelithiasis) in your gallbladder. The gallbladder stores a fluid that helps digest fats (bile). Cholecystitis is serious and needs treatment right away.  CAUSES   Gallstones. Gallstones can block the tube that leads to your gallbladder, causing bile to build up. As bile builds up, the gallbladder becomes inflamed.  Bile duct problems, such as blockage from scarring or kinking.  Tumors. Tumors can stop bile from leaving your gallbladder correctly, causing bile to build up. As bile builds up, the gallbladder becomes inflamed. SYMPTOMS   Nausea.  Vomiting.  Abdominal pain, especially in the upper right area of your abdomen.  Abdominal tenderness or bloating.  Sweating.  Chills.  Fever.  Yellowing of the skin and the whites of the eyes (jaundice). DIAGNOSIS  Your caregiver may order blood tests to look for infection or gallbladder problems. Your caregiver may also order imaging tests, such as an ultrasound or computed tomography (CT) scan. Further tests may include a hepatobiliary iminodiacetic acid (HIDA) scan. This scan allows your caregiver to see your bile move from the liver to the gallbladder and to the small intestine. TREATMENT  A hospital stay is usually necessary to lessen the inflammation of your gallbladder. You may be required to not eat or drink (fast) for a certain amount of time. You may be given medicine to treat pain or an antibiotic medicine to treat an infection. Surgery may be needed to remove your gallbladder (cholecystectomy) once the inflammation has gone down. Surgery may be needed right away if you develop complications such as death of gallbladder tissue (gangrene)  or a tear (perforation) of the gallbladder.  HOME CARE INSTRUCTIONS  Home care will depend on your treatment. In general:  If you were given antibiotics, take them as directed. Finish them even if you start to feel better.  Only take over-the-counter or prescription medicines for pain, discomfort, or fever as directed by your caregiver.  Follow a low-fat diet until you see your caregiver again.  Keep all follow-up visits as directed by your caregiver. SEEK IMMEDIATE MEDICAL CARE IF:   Your pain is increasing and not controlled by medicines.  Your pain moves to another part of your abdomen or to your back.  You have a fever.  You have nausea and vomiting. MAKE SURE YOU:  Understand these instructions.  Will watch your condition.  Will get help right away if you are not doing well or get worse. Document Released: 01/18/2005 Document Revised: 04/12/2011 Document Reviewed: 12/04/2010 [facility] Patient Information 2013 [place], [place].   Anal Intraepithelial Neoplasia (AIN) ANAL INTRAEPITHELIAL NEOPLASIA (AIN) Anal Intra-epithelial Neoplasia (AIN) is a pre-cancerous condition of the skin surrounding the anus. In the early stages of AIN there are abnormal skin cells (epithelial cells) in the outer third of the skin (epithelium). This is called AIN1 (also called a low-grade anal squamous intra-epithelial lesion (LSIL). This can progress to AIN2, where the outer 2/3 of the skin contains abnormal cells, which in turn can result in AIN3 where there are abnormal cells involving the full thickness of skin (AIN2 and AIN3 are also called high-grade anal squamous intra-epithelial lesions (HSIL). The next step is for these abnormal cells to cross a barrier at the base of  the skin called the basement membrane. Once this occurs, it is invasive cancer (carcinoma).  RISK FACTORS The risk factors for AIN are the same as for anal cancer, and include the immunosuppressed (eg HIV and transplant patients),  and those with previous anal warts. PREVENTION The vaccine currently offered to women to reduce the risk of cervical cancer targets the Human Papilloma Virus (HPV) serotypes 16, 18, 6 and 11. Although not approved for this purpose, this vaccine may have a role for reducing the risk of AIN and anal cancer in high risk populations who do not yet have HPV infection. TREATMENT OF AIN Treatment is aimed at eradicating AIN and preventing anal cancer with minimal disturbance to anal function. There is currently no uniform treatment largely due to the uncertain natural history of AIN, the variable extent of disease, and the fact there is not a universally effective treatment modality.  TOPICAL AGENTS Imiquimod cream 5% (Aldara ) can be applied to the area 3 times a day, and can be used for up to 16 weeks. It is an immunomodulatory agent and that attacks the virus responsible for warts, but also has anti-tumour effects [1]. There is some evidence that it not only slows the progression of AIN, but causes regression, with one study showing that 3/4 of men with AIN had their AIN completely cleared at the end of treatment[2]. It also has the added benefit in those with warts of causing regression of these, and in some cases eradication of the HPV virus. The main disadvantage is relapse in a quarter of cases after cessation of therapy[1]. Side effects of imiquimod include erythema, "flu?like" illness and erosions, which are usually mild. It should not be applied prior to sexual intercourse. A systematic review showed a noncompliance rate of less than 5% because of side effects, mainly related to incompatibility with sexual life [3-4]. Topical 5-Fluorouracil 5% cream (Efudix) can be applied to the area 1-2 times a day, and the largest study showing a recurrence of high grade AIN in only 8% of patients when used for 9-12 weeks[5]. PHOTODYNAMIC THERAPY Photodynamic therapy (PDT) involves the application of a cream  (topical sensitizer) such as 5-Flourouracil (Efudix) and subsequent exposure of the anal region to light and oxygen to generate oxygen intermediates that damage areas with AIN [6]. Although there is little evidence, there is a suggestion that early AIN responds to this treatment [7-8]. INFRARED PHOTOCOAGULATION Infrared photocoagulation is the same as photodynamic therapy except that it only uses light with a wavelength longer than visible light. It's use for AIN was first described by Gwenlyn Fudge and colleagues [9], who showed that up to 2/3 of AIN can be cured and is effective in preventing progression to cancer.  SURGERY The treatment of widespread AIN3 is controversial. Most advocate surgery, however even with surgery there is a one third risk of recurrence [10-11]. If surgery is performed, it consists of either local excision or extensive excision with split skin grafting. The high recurrence rate with these procedures is thought to be related to ongoing HPV, multi?focal lesions that are not all treated, and a generalised field change. [place] of the high recurrence rate and extensive surgery required for widespread AIN3, many advocate just close surveillance with regular (3-6 monthly) biopsies, so that early invasive anal cancer can be picked up early and treated accordingly. RADIOTHERAPY Radiotherapy has no role for AIN 3. Its only role is for confirmed anal cancer. FOLLOW-UP Follow?up should involve anoscopic examination, with or without the aid of high?resolution  anoscopy. AIN 1 should be reviewed every 6?12 months with discharge from follow up upon resolution of AIN. AIN 3, especially in HIV?positive patients should be followed more closely every 3?6 months with or without treatment. The follow?up of AIN 2 is less clear, as the natural history of this condition is so uncertain. A regime somewhere between that of AIN 1 and 3 is advised, with HIV?positive or immunosuppressed patients being followed  more like patients with AIN 3.

## 2012-02-15 ENCOUNTER — Telehealth (INDEPENDENT_AMBULATORY_CARE_PROVIDER_SITE_OTHER): Payer: Self-pay

## 2012-02-15 ENCOUNTER — Telehealth: Payer: Self-pay | Admitting: *Deleted

## 2012-02-15 NOTE — Telephone Encounter (Signed)
Dr. Michaell Cowing requests patient to be followed by Dr. Truett Perna now. Will need to be seen in March 2014 after his CT scan on 04/10/12. Will forward request to GI nurse navigator.

## 2012-02-15 NOTE — Telephone Encounter (Signed)
LMOM on Dr Kalman Drape nurse v.m. Asking if pt could be switched to see Dr Truett Perna b/c he is a pt of Dr Knute Neu. The pt was scheduled to see her in March after a CT scan that is scheduled for the beginning of March. We know that Dr Reather Laurence is not at the cancer ctr anymore so Dr Michaell Cowing is asking for the pt to be switched to DR Truett Perna for pt's rectal ca f/u. I asked for a call back to see if this can be done so I can let the pt know.

## 2012-03-04 ENCOUNTER — Telehealth: Payer: Self-pay | Admitting: Oncology

## 2012-03-04 NOTE — Telephone Encounter (Signed)
Pt schedule D/T per MD.  Transfer pt- LO to GS.  Letter/Calendar mailed.

## 2012-03-09 ENCOUNTER — Encounter: Payer: Self-pay | Admitting: Radiation Oncology

## 2012-03-09 ENCOUNTER — Ambulatory Visit
Admission: RE | Admit: 2012-03-09 | Discharge: 2012-03-09 | Disposition: A | Discharge status: Home / Self Care | Payer: BC Managed Care – PPO | Source: Ambulatory Visit | Attending: Radiation Oncology | Admitting: Radiation Oncology

## 2012-03-09 VITALS — BP 116/68 | HR 106 | Temp 97.7°F | Wt 218.9 lb

## 2012-03-09 DIAGNOSIS — C210 Malignant neoplasm of anus, unspecified: Secondary | ICD-10-CM

## 2012-03-09 NOTE — Progress Notes (Signed)
Patient here for routine follow up completion of anal radiation April 2013.Colonoscopy on February 15, 2012 was clear per patient.Ct scheduled for 04/10/12 and to follow up with Dr.Sherrill.Patient is doing great. No pain and no problems with bowels since removal of polyps and removal of scar tissue on 10/22/11.Weight greater than pre cancer treatment.

## 2012-03-09 NOTE — Progress Notes (Signed)
  Radiation Oncology         [phone]) [phone] ________________________________  Name: Matthew Cortez MRN: [id number]  Date: 03/09/2012  DOB: February 18, 1960  Follow-Up Visit Note  CC: Cliffton Asters, MD  Michaell Cowing Salvatore Decent., MD  Diagnosis:   Squamous cell carcinoma of the anal canal  Interval Since Last Radiation:  11 months   Narrative:  The patient returns today for routine follow-up.  The patient states that he is doing very well. He relates no GI difficulties at this point, no ongoing diarrhea or difficulties with bowel movements. The patient had a recent colonoscopy and everything looked good. Some external hemorrhoids were seen as well as some erythematous mucosa within the rectum. The patient has continued followup with Dr. Michaell Cowing.  he, since he was last seen, performed an excision of some anal intraepithelial neoplasia/scar excision. The patient states that he has felt much, much better since that procedure.                              ALLERGIES:  is allergic to doxycycline; sulfonamide derivatives; amoxicillin; nevirapine; and penicillins.  Meds: Current Outpatient Prescriptions  Medication Sig Dispense Refill  . dapsone 100 MG tablet Take 100 mg by mouth every morning.       . promethazine (PHENERGAN) 25 MG tablet Take 25 mg by mouth every 6 (six) hours as needed.      . REYATAZ 300 MG capsule TAKE 1 CAPSULE BY MOUTH ONCE DAILY  90 capsule  3  . ritonavir (NORVIR) 100 MG TABS Take 100 mg by mouth every morning.      . TRUVADA 200-300 MG per tablet TAKE 1 TABLET BY MOUTH ONCE DAILY  90 tablet  3  . zidovudine (RETROVIR) 300 MG tablet TAKE 1 TABLET BY MOUTH TWICE DAILY  120 tablet  3    Physical Findings: The patient is in no acute distress. Patient is alert and oriented.  weight is 218 lb 14.4 oz (99.292 kg). His temperature is 97.7 F (36.5 C). His blood pressure is 116/68 and his pulse is 106. His oxygen saturation is 95%. .   General: Well-developed, in no acute distress HEENT:  Normocephalic, atraumatic Cardiovascular: Regular rate and rhythm Respiratory: Clear to auscultation bilaterally GI: Soft, nontender, normal bowel sounds Extremities: No edema present Anal - external hemorrhoid present. No worrisome findings/lesions. No masses/lesions within the anal canal.  Lab Findings: Lab Results  Component Value Date   WBC 3.5* 12/08/2011   HGB 13.8 12/08/2011   HCT 38.4 12/08/2011   MCV 113.7* 12/08/2011   PLT 173 12/08/2011     Radiographic Findings: No results found.  Impression:    The patient is doing well clinically. No evidence of active disease at this time.  Plan:  Continued followup and I will stagger his visits with his other physicians.  I spent 10 minutes with the patient today, the majority of which was spent counseling the patient on the diagnosis of cancer and coordinating care.   Radene Gunning, M.D., Ph.D.

## 2012-03-10 ENCOUNTER — Ambulatory Visit: Payer: BC Managed Care – PPO | Admitting: Radiation Oncology

## 2012-03-22 ENCOUNTER — Other Ambulatory Visit: Payer: BC Managed Care – PPO

## 2012-03-22 DIAGNOSIS — B20 Human immunodeficiency virus [HIV] disease: Secondary | ICD-10-CM

## 2012-03-22 LAB — COMPREHENSIVE METABOLIC PANEL
AST: 41 U/L — ABNORMAL HIGH (ref 0–37)
Albumin: 4.1 g/dL (ref 3.5–5.2)
BUN: 23 mg/dL (ref 6–23)
Calcium: 8.9 mg/dL (ref 8.4–10.5)
Chloride: 105 mEq/L (ref 96–112)
Glucose, Bld: 104 mg/dL — ABNORMAL HIGH (ref 70–99)
Potassium: 4.7 mEq/L (ref 3.5–5.3)
Sodium: 138 mEq/L (ref 135–145)
Total Protein: 6.6 g/dL (ref 6.0–8.3)

## 2012-03-22 LAB — RPR

## 2012-03-22 LAB — CBC
HCT: 36.4 % — ABNORMAL LOW (ref 39.0–52.0)
Hemoglobin: 13.2 g/dL (ref 13.0–17.0)
RDW: 13.4 % (ref 11.5–15.5)
WBC: 3.3 10*3/uL — ABNORMAL LOW (ref 4.0–10.5)

## 2012-03-23 LAB — T-HELPER CELL (CD4) - (RCID CLINIC ONLY): CD4 T Cell Abs: 50 uL — ABNORMAL LOW (ref 400–2700)

## 2012-03-24 LAB — HIV-1 RNA QUANT-NO REFLEX-BLD
HIV 1 RNA Quant: 2376 copies/mL — ABNORMAL HIGH (ref <20)
HIV-1 RNA Quant, Log: 3.38 {Log} — ABNORMAL HIGH (ref <1.30)

## 2012-04-06 ENCOUNTER — Ambulatory Visit (INDEPENDENT_AMBULATORY_CARE_PROVIDER_SITE_OTHER): Payer: BC Managed Care – PPO | Admitting: Internal Medicine

## 2012-04-06 VITALS — BP 126/81 | HR 58 | Temp 98.1°F | Ht 74.0 in | Wt 221.4 lb

## 2012-04-06 DIAGNOSIS — B20 Human immunodeficiency virus [HIV] disease: Secondary | ICD-10-CM

## 2012-04-06 NOTE — Progress Notes (Signed)
Patient ID: Matthew Cortez, male   DOB: July 04, 1960, 52 y.o.   MRN: [id number]          [facility]  Patient Active Problem List  Diagnosis  . HIV INFECTION  . CIGARETTE SMOKER  . Chronic cholecystitis with calculus  . DEGENERATIVE DISC DISEASE  . BACK PAIN, LUMBAR, WITH RADICULOPATHY  . Squamous cell carcinoma of anus  . Cough  . 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 Hyperbilirubinemia, Tbili 2-2.5    Patient's Medications  New Prescriptions   No medications on file  Previous Medications   DAPSONE 100 MG TABLET    Take 100 mg by mouth every morning.    PROMETHAZINE (PHENERGAN) 25 MG TABLET    Take 25 mg by mouth every 6 (six) hours as needed.   REYATAZ 300 MG CAPSULE    TAKE 1 CAPSULE BY MOUTH ONCE DAILY   RITONAVIR (NORVIR) 100 MG TABS    Take 100 mg by mouth every morning.   TRUVADA 200-300 MG PER TABLET    TAKE 1 TABLET BY MOUTH ONCE DAILY   ZIDOVUDINE (RETROVIR) 300 MG TABLET    TAKE 1 TABLET BY MOUTH TWICE DAILY  Modified Medications   No medications on file  Discontinued Medications   No medications on file    Subjective:  [name] is in for his routine visit. He is feeling well and without complaints. He continues to be very busy at work. He denies missing any doses of his antiretroviral medications since his last visit. He continues to be bothered by daily diarrhea, usually having only one episode. He does not like to take Imodium because it will cause him to be constipated for several days.  Objective: Temp: 98.1 F (36.7 C) (03/06 1535) Temp src: Oral (03/06 1535) BP: 126/81 mmHg (03/06 1535) Pulse Rate: 58 (03/06 1535)  General: he is in very good spirits as usual Skin: no rash Lungs: clear Cor: regular S1 and S2 no murmurs Abdomen: soft and nontender  Lab Results HIV 1 RNA Quant (copies/mL)  Date Value  03/22/2012 2376*  12/22/2011 59*    11/08/2011 7180*     CD4 T Cell Abs (cmm)  Date Value  03/22/2012 50*  12/22/2011 60*  11/08/2011 50*     Assessment: [name] HIV viral load has reactivated and am worried about resistance. 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 I will repeat a genotype resistance test as well as an intergrade his resistance test and see him back in 3 weeks to craft a new salvage regimen.  Plan: 1. Continue current regimen for now 2. Check resistance assays 3. Followup in 3 weeks   Cliffton Asters, MD [place] for Infectious Disease [facility] [phone] pager   [phone] cell 04/06/2012, 5:10 PM

## 2012-04-10 ENCOUNTER — Ambulatory Visit (HOSPITAL_COMMUNITY)
Admission: RE | Admit: 2012-04-10 | Discharge: 2012-04-10 | Disposition: A | Discharge status: Home / Self Care | Payer: BC Managed Care – PPO | Source: Ambulatory Visit | Attending: Hematology and Oncology | Admitting: Hematology and Oncology

## 2012-04-10 ENCOUNTER — Other Ambulatory Visit (HOSPITAL_BASED_OUTPATIENT_CLINIC_OR_DEPARTMENT_OTHER): Payer: BC Managed Care – PPO

## 2012-04-10 DIAGNOSIS — Z923 Personal history of irradiation: Secondary | ICD-10-CM | POA: Insufficient documentation

## 2012-04-10 DIAGNOSIS — C210 Malignant neoplasm of anus, unspecified: Secondary | ICD-10-CM

## 2012-04-10 DIAGNOSIS — Z9221 Personal history of antineoplastic chemotherapy: Secondary | ICD-10-CM | POA: Insufficient documentation

## 2012-04-10 LAB — COMPREHENSIVE METABOLIC PANEL (CC13)
ALT: 31 U/L (ref 0–55)
AST: 24 U/L (ref 5–34)
BUN: 22.3 mg/dL (ref 7.0–26.0)
CO2: 25 mEq/L (ref 22–29)
Calcium: 8.8 mg/dL (ref 8.4–10.4)
Chloride: 106 mEq/L (ref 98–107)
Creatinine: 0.9 mg/dL (ref 0.7–1.3)
Total Bilirubin: 1.46 mg/dL — ABNORMAL HIGH (ref 0.20–1.20)

## 2012-04-10 LAB — CBC WITH DIFFERENTIAL/PLATELET
BASO%: 0.8 % (ref 0.0–2.0)
Basophils Absolute: 0 10*3/uL (ref 0.0–0.1)
EOS%: 1.8 % (ref 0.0–7.0)
HCT: 36.1 % — ABNORMAL LOW (ref 38.4–49.9)
HGB: 13.1 g/dL (ref 13.0–17.1)
LYMPH%: 37.9 % (ref 14.0–49.0)
MCH: 39.9 pg — ABNORMAL HIGH (ref 27.2–33.4)
MCHC: 36.2 g/dL — ABNORMAL HIGH (ref 32.0–36.0)
NEUT%: 41 % (ref 39.0–75.0)
Platelets: 161 10*3/uL (ref 140–400)

## 2012-04-10 MED ORDER — IOHEXOL 300 MG/ML  SOLN
100.0000 mL | Freq: Once | INTRAMUSCULAR | Status: AC | PRN
  Administered 2012-04-10: 100 mL via INTRAVENOUS

## 2012-04-11 ENCOUNTER — Encounter (INDEPENDENT_AMBULATORY_CARE_PROVIDER_SITE_OTHER): Payer: Self-pay | Admitting: Surgery

## 2012-04-13 ENCOUNTER — Ambulatory Visit: Payer: BC Managed Care – PPO | Admitting: Hematology and Oncology

## 2012-04-13 ENCOUNTER — Ambulatory Visit (HOSPITAL_BASED_OUTPATIENT_CLINIC_OR_DEPARTMENT_OTHER): Payer: BC Managed Care – PPO | Admitting: Oncology

## 2012-04-13 VITALS — BP 121/68 | HR 60 | Temp 96.7°F | Resp 20 | Ht 74.0 in | Wt 221.7 lb

## 2012-04-13 DIAGNOSIS — C210 Malignant neoplasm of anus, unspecified: Secondary | ICD-10-CM

## 2012-04-13 DIAGNOSIS — B20 Human immunodeficiency virus [HIV] disease: Secondary | ICD-10-CM

## 2012-04-13 NOTE — Progress Notes (Signed)
   [facility]    OFFICE PROGRESS NOTE   INTERVAL HISTORY:   He has been followed by Dr. Dalene Carrow since being diagnosed with anal cancer in 2011. He reports feeling well. No complaint. He sees Dr. Michaell Cowing for followup anal examinations. He underwent multiple biopsies in September 2013 that revealed intraepithelial neoplasia.  He is followed closely by Dr. Orvan Falconer for management of HIV infection.  Objective:  Vital signs in last 24 hours:  Blood pressure 121/68, pulse 60, temperature 96.7 F (35.9 C), temperature source Oral, resp. rate 20, height 6\' 2"  (1.88 m), weight 221 lb 11.2 oz (100.562 kg).    HEENT: Neck without mass Lymphatics: No cervical, supraclavicular, axillary, or inguinal nodes Resp: Lungs clear bilaterally Cardio: Regular rate and rhythm GI: No hepatosplenomegaly, no mass (rectal exam deferred secondary to recent exam by Dr. Mitzi Hansen and planned exam with Dr. Michaell Cowing next month)  Vascular: No leg edema Skin: Multiple fingernails are breaking down   Lab Results:  Lab Results  Component Value Date   WBC 2.7* 04/10/2012   HGB 13.1 04/10/2012   HCT 36.1* 04/10/2012   MCV 110.0* 04/10/2012   PLT 161 04/10/2012   ANC 1.1  X-rays: Restaging CT of the abdomen and pelvis on 04/10/2012-lung bases are clear, no focal hepatic lesion, 7 mm left periaortic node is unchanged compared to a PET scan in July 2013, no pelvic lymphadenopathy, the rectum and anus appear normal  Medications: I have reviewed the patient's current medications.  Assessment/Plan: 1. Anal cancer-status post 5-FU/mitomycin-C and concurrent radiation given between 03/09/2011 and 05/07/2011, negative restaging CT of the abdomen and pelvis on 04/10/2012 2. Anal intraepithelial neoplasia-followed closely by Dr. Michaell Cowing, status post multiple biopsies in September 2013 3. HIV infection   Disposition:  He remains in clinical remission from anal cancer. He will continue anal surveillance with Dr.  Michaell Cowing. I will discuss the indication for additional surveillance CT scans with Dr. Michaell Cowing. Mr. Evrard will return for an office visit in 6 months.   Thornton Papas, MD  04/13/2012  1:33 PM

## 2012-04-14 ENCOUNTER — Telehealth: Payer: Self-pay | Admitting: Oncology

## 2012-04-14 NOTE — Telephone Encounter (Signed)
Called pt and left messsge regarding appt for lab and MD on September 2014

## 2012-04-25 ENCOUNTER — Telehealth: Payer: Self-pay | Admitting: *Deleted

## 2012-04-25 NOTE — Telephone Encounter (Signed)
MD not in clinic on 05/01/12.  Message left for pt than changing appt to 05/04/12 @ 10:15.  Pt to call back if this appt is not convenient.

## 2012-05-01 ENCOUNTER — Ambulatory Visit: Payer: BC Managed Care – PPO | Admitting: Internal Medicine

## 2012-05-04 ENCOUNTER — Ambulatory Visit: Payer: BC Managed Care – PPO | Admitting: Internal Medicine

## 2012-05-08 ENCOUNTER — Telehealth: Payer: Self-pay | Admitting: *Deleted

## 2012-05-08 NOTE — Telephone Encounter (Signed)
Left message reminding pt of appt for tomorrow AM.

## 2012-05-09 ENCOUNTER — Ambulatory Visit (INDEPENDENT_AMBULATORY_CARE_PROVIDER_SITE_OTHER): Payer: BC Managed Care – PPO | Admitting: Internal Medicine

## 2012-05-09 VITALS — BP 135/86 | HR 57 | Temp 97.4°F | Ht 74.0 in | Wt 222.5 lb

## 2012-05-09 DIAGNOSIS — B20 Human immunodeficiency virus [HIV] disease: Secondary | ICD-10-CM

## 2012-05-09 MED ORDER — DOLUTEGRAVIR SODIUM 50 MG PO TABS: 50.0000 mg | ORAL_TABLET | Freq: Every day | ORAL | Status: DC

## 2012-05-09 MED ORDER — DARUNAVIR ETHANOLATE 800 MG PO TABS: 800.0000 mg | ORAL_TABLET | Freq: Every day | ORAL | Status: DC

## 2012-05-09 NOTE — Progress Notes (Signed)
HPI: Matthew Cortez is a 52 y.o. male with HIV who is here for his follow up.   Allergies: Allergies  Allergen Reactions  . Doxycycline Hives    "Burned inside my body, lips split open and bled"  . Sulfonamide Derivatives Hives    "burned inside my body, inside my mouth"  . Amoxicillin     REACTION: rash  . Nevirapine     REACTION: Stevens-Johnson syndrome  . Penicillins     Vitals: Temp: 97.4 F (36.3 C) (04/08 0926) Temp src: Oral (04/08 0926) BP: 135/86 mmHg (04/08 0926) Pulse Rate: 57 (04/08 0926)  Past Medical History: Past Medical History  Diagnosis Date  . HIV infection   . DDD (degenerative disc disease)   . Cholelithiasis   . Allergy   . History of radiation therapy 03/29/11- 05/07/11    EOT4/06/2011 anan ca, 52.2Gy  . DDD (degenerative disc disease)   . 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 Cancer 02/11/2011    squamous cell carcinoma of anus  . Anal condyloma   . Pneumonia 15 yrs ago    Social History: History   Social History  . Marital Status: Divorced    Spouse Name: N/A    Number of Children: N/A  . Years of Education: N/A   Social History Main Topics  . Smoking status: Former Smoker -- 0.25 packs/day for 20 years    Types: Cigarettes    Quit date: 03/07/2010  . Smokeless tobacco: Never Used     Comment: off and on for 20 yrs, 4 cigs/day  . Alcohol Use: 6.0 oz/week    12 drink(s) per week     Comment: 3 glasses of wine per week, none for past 2-3 mos  . Drug Use: No     Comment: quit smoking 03/2010  . Sexually Active: Not Currently     Comment: declined condoms   Other Topics Concern  . Not on file   Social History Narrative  . No narrative on file    Previous Regimen:   Current Regimen: ATV/r + Truvada + AZT  Labs: HIV 1 RNA Quant (copies/mL)  Date Value  04/06/2012 781*  03/22/2012 2376*  12/22/2011 59*     CD4 T Cell Abs (cmm)  Date Value  03/22/2012 50*  12/22/2011 60*  11/08/2011 50*     Hep B S Ab (no units)   Date Value  03/28/2006 Yes      Hepatitis B Surface Ag (no units)  Date Value  03/28/2006 No      HCV Ab (no units)  Date Value  03/28/2006 No     CrCl: Estimated Creatinine Clearance: 124.6 ml/min (by C-G formula based on Cr of 0.9).  Lipids:    Component Value Date/Time   CHOL 185 03/22/2012 1032   TRIG 98 03/22/2012 1032   HDL 49 03/22/2012 1032   CHOLHDL 3.8 03/22/2012 1032   VLDL 20 03/22/2012 1032   LDLCALC 116* 03/22/2012 1032    Assessment: Pt has a hx of noncompliance. I discussed that he really needs to take his meds consistently because he has developed some resistance. He is probably failing his current regimen since his number is still detectable. I suspect ATV is losing its activity. We discussed simplifying his regimen and change his regimen around.  Recommendations: Change regimen to:  DRV/r + Truvada + Tivicay F/u in a few weeks for labs  Clide Cliff, PharmD Clinical Infectious Disease Pharmacist [facility] for Infectious Disease 05/09/2012,  10:58 AM

## 2012-05-09 NOTE — Progress Notes (Signed)
Patient ID: Matthew Cortez, male   DOB: 05-18-1960, 52 y.o.   MRN: [id number]          [facility]ious Disease  Patient Active Problem List  Diagnosis  . HIV INFECTION  . CIGARETTE SMOKER  . Chronic cholecystitis with calculus  . DEGENERATIVE DISC DISEASE  . BACK PAIN, LUMBAR, WITH RADICULOPATHY  . Squamous cell carcinoma of anus  . Cough  . 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 Hyperbilirubinemia, Tbili 2-2.5    Patient's Medications  New Prescriptions   DARUNAVIR ETHANOLATE (PREZISTA) 800 MG TABLET    Take 1 tablet (800 mg total) by mouth daily with breakfast.   DOLUTEGRAVIR SODIUM (TIVICAY) 50 MG TABS    Take 1 tablet (50 mg total) by mouth daily.  Previous Medications   DAPSONE 100 MG TABLET    Take 100 mg by mouth every morning.    PROMETHAZINE (PHENERGAN) 25 MG TABLET    Take 25 mg by mouth every 6 (six) hours as needed.   RITONAVIR (NORVIR) 100 MG TABS    Take 100 mg by mouth every morning.   TRUVADA 200-300 MG PER TABLET    TAKE 1 TABLET BY MOUTH ONCE DAILY  Modified Medications   No medications on file  Discontinued Medications   REYATAZ 300 MG CAPSULE    TAKE 1 CAPSULE BY MOUTH ONCE DAILY   ZIDOVUDINE (RETROVIR) 300 MG TABLET    TAKE 1 TABLET BY MOUTH TWICE DAILY    Subjective: Matthew Cortez is in for his routine visit. He denies missing any of his antiretroviral medications but she does have to take them at varying times during the day because of his work schedule. He is trying to find a new job where he has more regular hours.  Objective: Temp: 97.4 F (36.3 C) (04/08 0926) Temp src: Oral (04/08 0926) BP: 135/86 mmHg (04/08 0926) Pulse Rate: 57 (04/08 0926)  General: Good spirits his usual Skin: no rash Lungs: Clear Cor: Regular S1-S2 no murmurs  Lab Results HIV 1 RNA Quant (copies/mL)  Date Value  04/06/2012 781*  03/22/2012 2376*  12/22/2011 59*     CD4 T  Cell Abs (cmm)  Date Value  03/22/2012 50*  12/22/2011 60*  11/08/2011 50*     Assessment: His viral load remains elevated on his current regimen. I suspect he may be developing some resistance to atazanavir. I will change him to a boosted regimen of Truvada, Prezista, Norvir, and Tivicay. He has no evidence of integrase inhibitor resistance.  Plan: 1. Change regimen to Truvada, Prezista, Norvir and Tivicay   Cliffton Asters, MD [facility] for Infectious Disease [facility] [phone] pager   [phone] cell 05/09/2012, 10:02 AM

## 2012-05-19 ENCOUNTER — Telehealth: Payer: Self-pay | Admitting: *Deleted

## 2012-05-19 NOTE — Telephone Encounter (Deleted)
Message copied by Wandalee Ferdinand on Fri May 19, 2012  4:29 PM ------      Message from: Thornton Papas B      Created: Thu May 18, 2012  9:15 PM       Schedule CT C/A/P few days prior to next visit.      ----- Message -----         From: Ardeth Sportsman, MD         Sent: 04/13/2012   3:19 PM           To: Ladene Artist, MD            Repeat PET or CT in 6 months.  Last PET July 2013 was neg for that LN.  D/w Romie Levee as well.        ----- Message -----         From: Ladene Artist, MD         Sent: 04/13/2012   1:43 PM           To: Ardeth Sportsman, MD            Cts are negative, stable 7mm peri-aortic node            ? Need for additional CT's            Thanks,            Brad             ------

## 2012-05-19 NOTE — Telephone Encounter (Signed)
Per discussion with surgeon, Dr. Truett Perna wants CT scan C/A/P few days prior to his next visit on 10/23/12. Left VM for patient to call office next week. Will need lab prior to CT scan.

## 2012-05-26 ENCOUNTER — Telehealth: Payer: Self-pay | Admitting: Oncology

## 2012-05-26 ENCOUNTER — Other Ambulatory Visit: Payer: Self-pay | Admitting: *Deleted

## 2012-05-26 DIAGNOSIS — C210 Malignant neoplasm of anus, unspecified: Secondary | ICD-10-CM

## 2012-06-27 ENCOUNTER — Other Ambulatory Visit: Payer: Self-pay | Admitting: Internal Medicine

## 2012-06-27 ENCOUNTER — Other Ambulatory Visit: Payer: BC Managed Care – PPO

## 2012-06-27 DIAGNOSIS — B20 Human immunodeficiency virus [HIV] disease: Secondary | ICD-10-CM

## 2012-06-27 LAB — CBC WITH DIFFERENTIAL/PLATELET
Basophils Absolute: 0 10*3/uL (ref 0.0–0.1)
Basophils Relative: 0 % (ref 0–1)
Eosinophils Absolute: 0 10*3/uL (ref 0.0–0.7)
Eosinophils Relative: 1 % (ref 0–5)
Lymphocytes Relative: 36 % (ref 12–46)
MCH: 37.6 pg — ABNORMAL HIGH (ref 26.0–34.0)
MCV: 105.7 fL — ABNORMAL HIGH (ref 78.0–100.0)
Platelets: 186 10*3/uL (ref 150–400)
RDW: 13.5 % (ref 11.5–15.5)
WBC: 3.6 10*3/uL — ABNORMAL LOW (ref 4.0–10.5)

## 2012-06-27 LAB — COMPREHENSIVE METABOLIC PANEL
ALT: 19 U/L (ref 0–53)
CO2: 25 mEq/L (ref 19–32)
Sodium: 140 mEq/L (ref 135–145)
Total Bilirubin: 0.5 mg/dL (ref 0.3–1.2)
Total Protein: 6.4 g/dL (ref 6.0–8.3)

## 2012-06-28 LAB — HIV-1 RNA QUANT-NO REFLEX-BLD
HIV 1 RNA Quant: 223 copies/mL — ABNORMAL HIGH (ref <20)
HIV-1 RNA Quant, Log: 2.35 {Log} — ABNORMAL HIGH (ref <1.30)

## 2012-06-28 LAB — T-HELPER CELL (CD4) - (RCID CLINIC ONLY): CD4 % Helper T Cell: 5 % — ABNORMAL LOW (ref 33–55)

## 2012-07-11 ENCOUNTER — Ambulatory Visit (INDEPENDENT_AMBULATORY_CARE_PROVIDER_SITE_OTHER): Payer: BC Managed Care – PPO | Admitting: Internal Medicine

## 2012-07-11 ENCOUNTER — Encounter: Payer: Self-pay | Admitting: Internal Medicine

## 2012-07-11 VITALS — BP 137/83 | HR 56 | Temp 97.9°F | Wt 221.0 lb

## 2012-07-11 DIAGNOSIS — M722 Plantar fascial fibromatosis: Secondary | ICD-10-CM

## 2012-07-11 DIAGNOSIS — B20 Human immunodeficiency virus [HIV] disease: Secondary | ICD-10-CM

## 2012-07-11 NOTE — Progress Notes (Signed)
Patient ID: Matthew Cortez, male   DOB: 18-Feb-1960, 52 y.o.   MRN: [id number]          [facility] for Infectious Disease  Patient Active Problem List   Diagnosis Date Noted  . 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 HIV INFECTION 05/30/2007    Priority: High  . Plantar fasciitis of left foot 07/11/2012  . 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 Hyperbilirubinemia, Tbili 2-2.5 11/10/2011  . Cough 08/24/2011  . CIGARETTE SMOKER 11/27/2009  . Chronic cholecystitis with calculus 10/22/2008  . DEGENERATIVE DISC DISEASE 12/26/2007  . BACK PAIN, LUMBAR, WITH RADICULOPATHY 10/05/2007    Patient's Medications  New Prescriptions   No medications on file  Previous Medications   DAPSONE 100 MG TABLET    Take 100 mg by mouth every morning.    DARUNAVIR ETHANOLATE (PREZISTA) 800 MG TABLET    Take 1 tablet (800 mg total) by mouth daily with breakfast.   DOLUTEGRAVIR SODIUM (TIVICAY) 50 MG TABS    Take 1 tablet (50 mg total) by mouth daily.   MELOXICAM (MOBIC) 15 MG TABLET    Take 15 mg by mouth daily.   PROMETHAZINE (PHENERGAN) 25 MG TABLET    Take 25 mg by mouth every 6 (six) hours as needed.   RITONAVIR (NORVIR) 100 MG TABS    Take 100 mg by mouth every morning.   TRUVADA 200-300 MG PER TABLET    TAKE 1 TABLET BY MOUTH ONCE DAILY  Modified Medications   No medications on file  Discontinued Medications   No medications on file    Subjective: Matthew Cortez is in for his routine visit. He finds it is new salvage regimen is much easier to take and he has noted resolution of his nausea. He does not believe he is missed any doses. He has had problems with left plantar fasciitis recently but is improving after a prednisone taper and with the use of meloxicam. Review of Systems: Pertinent items are noted in HPI.  Past Medical History  Diagnosis Date  . HIV infection   . DDD  (degenerative disc disease)   . Cholelithiasis   . Allergy   . History of radiation therapy 03/29/11- 05/07/11    EOT4/06/2011 anan ca, 52.2Gy  . DDD (degenerative disc disease)   . 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 Cancer 02/11/2011    squamous cell carcinoma of anus  . Anal condyloma   . Pneumonia 15 yrs ago    History  Substance Use Topics  . Smoking status: Former Smoker -- 0.25 packs/day for 20 years    Types: Cigarettes    Quit date: 03/07/2010  . Smokeless tobacco: Never Used     Comment: off and on for 20 yrs, 4 cigs/day  . Alcohol Use: 6.0 oz/week    12 drink(s) per week     Comment: 3 glasses of wine per week, none for past 2-3 mos    Family History  Problem Relation Age of Onset  . Cancer Mother     abdominal tumor  . Cancer Maternal Aunt     breast  . Diabetes Brother     Allergies  Allergen Reactions  . Doxycycline Hives    "Burned inside my body, lips split open and bled"  . Sulfonamide Derivatives Hives    "burned inside my body, inside my mouth"  .  Amoxicillin     REACTION: rash  . Nevirapine     REACTION: Stevens-Johnson syndrome  . Penicillins     Objective: Temp: 97.9 F (36.6 C) (06/10 0851) Temp src: Oral (06/10 0851) BP: 137/83 mmHg (06/10 0851) Pulse Rate: 56 (06/10 0851)  General: He is in very good spirits Oral: No oropharyngeal lesions Skin: No rash Lungs: Clear Cor: Regular S1 and S2 no murmurs  Lab Results HIV 1 RNA Quant (copies/mL)  Date Value  06/27/2012 223*  04/06/2012 781*  03/22/2012 2376*     CD4 T Cell Abs (cmm)  Date Value  06/27/2012 70*  03/22/2012 50*  12/22/2011 60*     Assessment: His infection is coming under better control.  Plan: 1. Continue current antiretroviral regimen 2. Followup after lab work in 3 months   Cliffton Asters, MD [facility] for Infectious Disease [facility] [phone] pager   [phone] cell 07/11/2012, 9:14 AM

## 2012-07-21 NOTE — Telephone Encounter (Signed)
e

## 2012-07-21 NOTE — Telephone Encounter (Signed)
error 

## 2012-08-18 IMAGING — US IR FLUORO GUIDE CV LINE*L*
1 series · 1 of 1 positions shown · non-contrast
Comparison: none

CLINICAL DATA: Anal cancer, 042; central venous access is requested
for chemotherapy.

[Series 1: sp fluoro guide cv line*left* · 1 of 1 slices shown]
[im 1/1]
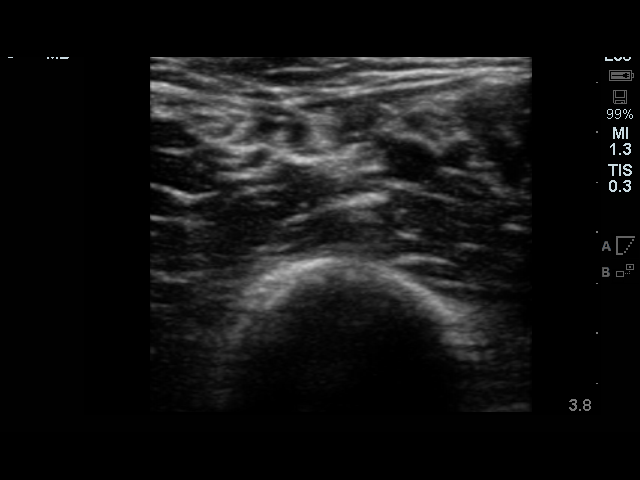

[1 of 1 positions shown; findings below may reference images not displayed]

PICC LINE PLACEMENT WITH ULTRASOUND AND FLUOROSCOPIC  GUIDANCE

Fluoroscopy Time: 0.9 minutes.

The left  arm was prepped with chlorhexidine, draped in the usual
sterile fashion using maximum barrier technique (cap and mask,
sterile gown, sterile gloves, large sterile sheet, hand hygiene and
cutaneous antisepsis) and infiltrated locally with 1% Lidocaine.

Ultrasound demonstrated patency of the left basilic vein, and this
was documented with an image.  Under real-time ultrasound guidance,
this vein was accessed with a 21 gauge micropuncture needle and
image documentation was performed.  The needle was exchanged over a
guidewire for a peel-away sheath through which a five French double
lumen PICC trimmed to 51 cm was advanced, positioned with its tip
at the lower SVC/right atrial junction.  Fluoroscopy during the
procedure and fluoro spot radiograph confirms appropriate catheter
position.  The catheter was flushed, secured to the skin with
Prolene sutures, and covered with a sterile dressing.

Complications:  None
IMPRESSION: Successful left arm PICC line placement with ultrasound and
fluoroscopic guidance.  The catheter is ready for use.

Read by: Braga, Erinque.-GASTON ANTONIO

## 2012-09-21 ENCOUNTER — Other Ambulatory Visit: Payer: Self-pay | Admitting: Podiatry

## 2012-09-21 DIAGNOSIS — M76812 Anterior tibial syndrome, left leg: Secondary | ICD-10-CM

## 2012-09-27 ENCOUNTER — Other Ambulatory Visit: Payer: BC Managed Care – PPO

## 2012-10-12 ENCOUNTER — Other Ambulatory Visit: Payer: BC Managed Care – PPO

## 2012-10-12 DIAGNOSIS — B20 Human immunodeficiency virus [HIV] disease: Secondary | ICD-10-CM

## 2012-10-12 LAB — CBC
Hemoglobin: 12.8 g/dL — ABNORMAL LOW (ref 13.0–17.0)
MCH: 35.8 pg — ABNORMAL HIGH (ref 26.0–34.0)
MCHC: 34.5 g/dL (ref 30.0–36.0)

## 2012-10-12 LAB — COMPLETE METABOLIC PANEL WITH GFR
AST: 20 U/L (ref 0–37)
BUN: 14 mg/dL (ref 6–23)
Calcium: 8.6 mg/dL (ref 8.4–10.5)
Chloride: 107 mEq/L (ref 96–112)
Creat: 1.14 mg/dL (ref 0.50–1.35)
GFR, Est African American: 86 mL/min

## 2012-10-13 LAB — HIV-1 RNA QUANT-NO REFLEX-BLD: HIV 1 RNA Quant: 277 copies/mL — ABNORMAL HIGH (ref <20)

## 2012-10-13 LAB — T-HELPER CELL (CD4) - (RCID CLINIC ONLY)
CD4 % Helper T Cell: 6 % — ABNORMAL LOW (ref 33–55)
CD4 T Cell Abs: 80 /uL — ABNORMAL LOW (ref 400–2700)

## 2012-10-16 ENCOUNTER — Encounter (HOSPITAL_COMMUNITY): Payer: Self-pay

## 2012-10-16 ENCOUNTER — Ambulatory Visit (HOSPITAL_COMMUNITY)
Admission: RE | Admit: 2012-10-16 | Discharge: 2012-10-16 | Disposition: A | Discharge status: Home / Self Care | Payer: BC Managed Care – PPO | Source: Ambulatory Visit | Attending: Oncology | Admitting: Oncology

## 2012-10-16 ENCOUNTER — Other Ambulatory Visit (HOSPITAL_BASED_OUTPATIENT_CLINIC_OR_DEPARTMENT_OTHER): Payer: BC Managed Care – PPO | Admitting: Lab

## 2012-10-16 DIAGNOSIS — Z9221 Personal history of antineoplastic chemotherapy: Secondary | ICD-10-CM | POA: Insufficient documentation

## 2012-10-16 DIAGNOSIS — C210 Malignant neoplasm of anus, unspecified: Secondary | ICD-10-CM

## 2012-10-16 DIAGNOSIS — Z923 Personal history of irradiation: Secondary | ICD-10-CM | POA: Insufficient documentation

## 2012-10-16 LAB — COMPREHENSIVE METABOLIC PANEL (CC13)
ALT: 23 U/L (ref 0–55)
AST: 20 U/L (ref 5–34)
Creatinine: 1.2 mg/dL (ref 0.7–1.3)
Total Bilirubin: 0.62 mg/dL (ref 0.20–1.20)

## 2012-10-16 MED ORDER — IOHEXOL 300 MG/ML  SOLN
100.0000 mL | Freq: Once | INTRAMUSCULAR | Status: AC | PRN
  Administered 2012-10-16: 100 mL via INTRAVENOUS

## 2012-10-17 ENCOUNTER — Telehealth: Payer: Self-pay | Admitting: *Deleted

## 2012-10-17 NOTE — Telephone Encounter (Signed)
Message copied by Caleb Popp on Tue Oct 17, 2012 10:48 AM ------      Message from: Ladene Artist      Created: Tue Oct 17, 2012  7:19 AM       Please call patient, CTs are negative ------

## 2012-10-17 NOTE — Telephone Encounter (Signed)
Called pt with CT results. Negative, per Dr. Truett Perna. Pt voiced understanding and appreciation for call. Next appt confirmed.

## 2012-10-23 ENCOUNTER — Ambulatory Visit (HOSPITAL_BASED_OUTPATIENT_CLINIC_OR_DEPARTMENT_OTHER): Payer: BC Managed Care – PPO | Admitting: Oncology

## 2012-10-23 VITALS — BP 126/70 | HR 54 | Temp 97.0°F | Resp 19 | Ht 74.0 in | Wt 216.9 lb

## 2012-10-23 DIAGNOSIS — C210 Malignant neoplasm of anus, unspecified: Secondary | ICD-10-CM

## 2012-10-23 NOTE — Progress Notes (Signed)
   [facility]    OFFICE PROGRESS NOTE   INTERVAL HISTORY:   He returns as scheduled. He feels well. He reports the current HIV regimen causes less nausea. No other complaint. He has not noted a change at the anus.  Objective:  Vital signs in last 24 hours:  Blood pressure 126/70, pulse 54, temperature 97 F (36.1 C), temperature source Oral, resp. rate 19, height 6\' 2"  (1.88 m), weight 216 lb 14.4 oz (98.385 kg).    HEENT: Neck without mass Lymphatics: No cervical, supraclavicular, axillary, or inguinal nodes Resp: Lungs clear bilaterally Cardio: Regular rate and rhythm GI: No hepatosplenomegaly Vascular: No leg edema Rectal: External hemorrhoids at the anal verge. No apparent condyloma. Anal canal with mucosal irregularity (? Biopsy sites), no mass    Lab Results:  Lab Results  Component Value Date   WBC 3.1* 10/12/2012   HGB 12.8* 10/12/2012   HCT 37.1* 10/12/2012   MCV 103.6* 10/12/2012   PLT 207 10/12/2012   X-rays: 10/16/2012-no evidence for metastatic disease in the chest. No intraperitoneal or periportal lymphadenopathy. Small 6 mm left periaortic node, stable. No perirectal lymphadenopathy. No iliac lymphadenopathy.   Medications: I have reviewed the patient's current medications.  Assessment/Plan: 1.Anal cancer-status post 5-FU/mitomycin-C and concurrent radiation given between 03/09/2011 and 05/07/2011, negative restaging CT of the abdomen and pelvis on 10/17/2038 2. Anal intraepithelial neoplasia-followed  by Dr. Michaell Cowing, status post multiple biopsies in September 2013  3. HIV infection    Disposition:  Mr. Mercadel remains in clinical remission from anal cancer. I do not recommend additional surveillance CT scans. We will refer him to Dr. Michaell Cowing for followup of the anal cancer and anal intraepithelial neoplasia. Mr. Dewey will return for an office visit in 9 months.   Thornton Papas, MD  10/23/2012  12:46 PM

## 2012-10-25 ENCOUNTER — Telehealth: Payer: Self-pay | Admitting: Oncology

## 2012-10-25 NOTE — Telephone Encounter (Signed)
S/w the pt and he is aware of his June 2015 appt with dr Truett Perna. Pt is aware that he will be called with an appt to see dr Viviann Spare gross from [facility] once their schedules becomes available for jan 2015.

## 2012-10-25 NOTE — Telephone Encounter (Signed)
S/w chappel from [facility] and she is supposed to cancel the appt on 11/06/2012 with dr gross

## 2012-10-26 ENCOUNTER — Encounter: Payer: Self-pay | Admitting: Internal Medicine

## 2012-10-26 ENCOUNTER — Ambulatory Visit (INDEPENDENT_AMBULATORY_CARE_PROVIDER_SITE_OTHER): Payer: BC Managed Care – PPO | Admitting: Internal Medicine

## 2012-10-26 VITALS — BP 117/77 | HR 54 | Temp 98.1°F | Ht 74.0 in | Wt 217.5 lb

## 2012-10-26 DIAGNOSIS — Z23 Encounter for immunization: Secondary | ICD-10-CM

## 2012-10-26 NOTE — Progress Notes (Signed)
Patient ID: Matthew Cortez, male   DOB: 11-13-1960, 52 y.o.   MRN: [id number]          [facility]  Patient Active Problem List   Diagnosis Date Noted  . 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 HIV INFECTION 05/30/2007    Priority: High  . Plantar fasciitis of left foot 07/11/2012  . 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 Hyperbilirubinemia, Tbili 2-2.5 11/10/2011  . Cough 08/24/2011  . CIGARETTE SMOKER 11/27/2009  . Chronic cholecystitis with calculus 10/22/2008  . DEGENERATIVE DISC DISEASE 12/26/2007  . BACK PAIN, LUMBAR, WITH RADICULOPATHY 10/05/2007    Patient's Medications  New Prescriptions   No medications on file  Previous Medications   DAPSONE 100 MG TABLET    Take 100 mg by mouth every morning.    DARUNAVIR ETHANOLATE (PREZISTA) 800 MG TABLET    Take 1 tablet (800 mg total) by mouth daily with breakfast.   DOLUTEGRAVIR SODIUM (TIVICAY) 50 MG TABS    Take 1 tablet (50 mg total) by mouth daily.   PROMETHAZINE (PHENERGAN) 25 MG TABLET    Take 25 mg by mouth every 6 (six) hours as needed.   RITONAVIR (NORVIR) 100 MG TABS    Take 100 mg by mouth every morning.   TRUVADA 200-300 MG PER TABLET    TAKE 1 TABLET BY MOUTH ONCE DAILY  Modified Medications   No medications on file  Discontinued Medications   No medications on file    Subjective: Matthew Cortez is in for his routine visit. He has not missed any doses of his HIV medications but he is changing jobs and his insurance will labs at the end of this month. He has a new job his new insurance will not kick in for 60 days. He has about 3 weeks left of his current supply of medications. He has not investigated whether or not he would be able to afford COBRA coverage during a 60 day window. Review of Systems: Pertinent items are noted in HPI.  Past Medical History  Diagnosis Date  . HIV  infection   . DDD (degenerative disc disease)   . Cholelithiasis   . Allergy   . History of radiation therapy 03/29/11- 05/07/11    EOT4/06/2011 anan ca, 52.2Gy  . DDD (degenerative disc disease)   . 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 Cancer 02/11/2011    squamous cell carcinoma of anus  . Anal condyloma   . Pneumonia 15 yrs ago    History  Substance Use Topics  . Smoking status: Former Smoker -- 0.25 packs/day for 20 years    Types: Cigarettes    Quit date: 03/07/2010  . Smokeless tobacco: Never Used     Comment: off and on for 20 yrs, 4 cigs/day  . Alcohol Use: 6.0 oz/week    12 drink(s) per week     Comment: 3 glasses of wine per week, none for past 2-3 mos    Family History  Problem Relation Age of Onset  . Cancer Mother     abdominal tumor  . Cancer Maternal Aunt     breast  . Diabetes Brother     Allergies  Allergen Reactions  . Doxycycline Hives    "Burned inside my body, lips split open and bled"  . Sulfonamide Derivatives Hives    "burned inside  my body, inside my mouth"  . Amoxicillin     REACTION: rash  . Nevirapine     REACTION: Stevens-Johnson syndrome  . Penicillins     Objective:    General: He is in good spirits Oral: No oropharyngeal lesions Skin: No rash Lungs: Clear Cor: Regular S1 and S2 no murmurs Mood and affect: Normal  Lab Results HIV 1 RNA Quant (copies/mL)  Date Value  10/12/2012 277*  06/27/2012 223*  04/06/2012 781*     CD4 T Cell Abs (/uL)  Date Value  10/12/2012 80*  06/27/2012 70*  03/22/2012 50*     Assessment: His HIV is improved over the past 6 months but he is at risk for not being able to stay on his medication during a 60 day window between work-related insurance coverage.  Plan: 1. Continue current antiretroviral regimen for now 2. Investigate the cost of COBRA coverage 3. Investigate patient assistance programs for his medications 4. Followup in 6 weeks 5. Influenza vaccination today   Cliffton Asters, MD [facility] for Infectious Disease [facility] [phone] pager   [phone] cell 10/26/2012, 9:26 AM

## 2012-11-06 ENCOUNTER — Encounter (INDEPENDENT_AMBULATORY_CARE_PROVIDER_SITE_OTHER): Payer: BC Managed Care – PPO | Admitting: Surgery

## 2012-11-09 ENCOUNTER — Ambulatory Visit: Payer: BC Managed Care – PPO | Admitting: Radiation Oncology

## 2012-11-16 ENCOUNTER — Ambulatory Visit
Admission: RE | Admit: 2012-11-16 | Payer: BC Managed Care – PPO | Source: Ambulatory Visit | Admitting: Radiation Oncology

## 2012-12-07 ENCOUNTER — Ambulatory Visit: Payer: BC Managed Care – PPO | Admitting: Internal Medicine

## 2012-12-07 ENCOUNTER — Telehealth: Payer: Self-pay | Admitting: *Deleted

## 2012-12-07 NOTE — Telephone Encounter (Signed)
Requested pt call [facility] for new appt.

## 2013-01-16 ENCOUNTER — Encounter (INDEPENDENT_AMBULATORY_CARE_PROVIDER_SITE_OTHER): Payer: Self-pay | Admitting: Surgery

## 2013-01-31 ENCOUNTER — Telehealth: Payer: Self-pay | Admitting: *Deleted

## 2013-01-31 NOTE — Telephone Encounter (Signed)
Requested pt call [facility] to schedule f/u visit in January with Dr. Orvan Falconer.

## 2013-02-17 ENCOUNTER — Other Ambulatory Visit: Payer: Self-pay | Admitting: Infectious Diseases

## 2013-02-17 DIAGNOSIS — B20 Human immunodeficiency virus [HIV] disease: Secondary | ICD-10-CM

## 2013-02-19 ENCOUNTER — Other Ambulatory Visit: Payer: Self-pay | Admitting: *Deleted

## 2013-02-19 ENCOUNTER — Telehealth: Payer: Self-pay | Admitting: *Deleted

## 2013-02-19 DIAGNOSIS — B20 Human immunodeficiency virus [HIV] disease: Secondary | ICD-10-CM

## 2013-02-19 MED ORDER — RITONAVIR 100 MG PO TABS: 100.0000 mg | ORAL_TABLET | Freq: Every morning | ORAL | Status: DC

## 2013-02-19 NOTE — Telephone Encounter (Signed)
Pt requested refills of his medications. Per chart review, patient needs to be seen in January with Dr. Megan Salon.  Authorized refills, left message asking patient to contact us for an appointment. Landis Gandy, RN

## 2013-02-20 ENCOUNTER — Telehealth: Payer: Self-pay | Admitting: *Deleted

## 2013-02-20 NOTE — Telephone Encounter (Signed)
Detectable VL - needing MD/lab appts.  Requested pt call for MD/lab appts.  Number given.

## 2013-03-05 ENCOUNTER — Other Ambulatory Visit: Payer: Self-pay | Admitting: Internal Medicine

## 2013-03-05 ENCOUNTER — Other Ambulatory Visit (INDEPENDENT_AMBULATORY_CARE_PROVIDER_SITE_OTHER): Payer: 59

## 2013-03-05 DIAGNOSIS — B20 Human immunodeficiency virus [HIV] disease: Secondary | ICD-10-CM

## 2013-03-05 DIAGNOSIS — Z113 Encounter for screening for infections with a predominantly sexual mode of transmission: Secondary | ICD-10-CM

## 2013-03-05 LAB — COMPREHENSIVE METABOLIC PANEL
ALT: 43 U/L (ref 0–53)
AST: 34 U/L (ref 0–37)
Albumin: 4.3 g/dL (ref 3.5–5.2)
Alkaline Phosphatase: 75 U/L (ref 39–117)
BUN: 18 mg/dL (ref 6–23)
CO2: 25 mEq/L (ref 19–32)
Calcium: 8.7 mg/dL (ref 8.4–10.5)
Chloride: 106 mEq/L (ref 96–112)
Creat: 1.1 mg/dL (ref 0.50–1.35)
Glucose, Bld: 146 mg/dL — ABNORMAL HIGH (ref 70–99)
Potassium: 4.4 mEq/L (ref 3.5–5.3)
Sodium: 138 mEq/L (ref 135–145)
Total Bilirubin: 0.5 mg/dL (ref 0.2–1.2)
Total Protein: 6.3 g/dL (ref 6.0–8.3)

## 2013-03-05 LAB — CBC WITH DIFFERENTIAL/PLATELET
Basophils Absolute: 0 10*3/uL (ref 0.0–0.1)
Basophils Relative: 1 % (ref 0–1)
EOS ABS: 0 10*3/uL (ref 0.0–0.7)
Eosinophils Relative: 1 % (ref 0–5)
HEMATOCRIT: 36.9 % — AB (ref 39.0–52.0)
HEMOGLOBIN: 12.8 g/dL — AB (ref 13.0–17.0)
Lymphocytes Relative: 23 % (ref 12–46)
Lymphs Abs: 0.9 10*3/uL (ref 0.7–4.0)
MCH: 34.6 pg — AB (ref 26.0–34.0)
MCHC: 34.7 g/dL (ref 30.0–36.0)
MCV: 99.7 fL (ref 78.0–100.0)
MONO ABS: 0.4 10*3/uL (ref 0.1–1.0)
MONOS PCT: 12 % (ref 3–12)
Neutro Abs: 2.4 10*3/uL (ref 1.7–7.7)
Neutrophils Relative %: 63 % (ref 43–77)
Platelets: 227 10*3/uL (ref 150–400)
RBC: 3.7 MIL/uL — AB (ref 4.22–5.81)
RDW: 13.1 % (ref 11.5–15.5)
WBC: 3.8 10*3/uL — ABNORMAL LOW (ref 4.0–10.5)

## 2013-03-05 LAB — RPR

## 2013-03-06 LAB — T-HELPER CELL (CD4) - (RCID CLINIC ONLY)
CD4 % Helper T Cell: 10 % — ABNORMAL LOW (ref 33–55)
CD4 T Cell Abs: 100 /uL — ABNORMAL LOW (ref 400–2700)

## 2013-03-07 LAB — HIV-1 RNA QUANT-NO REFLEX-BLD
HIV 1 RNA QUANT: 74 {copies}/mL — AB (ref <20)
HIV-1 RNA Quant, Log: 1.87 {Log} — ABNORMAL HIGH (ref <1.30)

## 2013-03-20 ENCOUNTER — Ambulatory Visit: Payer: BC Managed Care – PPO | Admitting: Internal Medicine

## 2013-04-17 ENCOUNTER — Encounter: Payer: Self-pay | Admitting: Internal Medicine

## 2013-04-17 ENCOUNTER — Ambulatory Visit (INDEPENDENT_AMBULATORY_CARE_PROVIDER_SITE_OTHER): Payer: 59 | Admitting: Internal Medicine

## 2013-04-17 ENCOUNTER — Other Ambulatory Visit: Payer: Self-pay | Admitting: *Deleted

## 2013-04-17 VITALS — BP 124/84 | HR 61 | Temp 98.2°F | Ht 74.0 in | Wt 216.0 lb

## 2013-04-17 DIAGNOSIS — K529 Noninfective gastroenteritis and colitis, unspecified: Secondary | ICD-10-CM | POA: Insufficient documentation

## 2013-04-17 DIAGNOSIS — R197 Diarrhea, unspecified: Secondary | ICD-10-CM

## 2013-04-17 DIAGNOSIS — R739 Hyperglycemia, unspecified: Secondary | ICD-10-CM

## 2013-04-17 DIAGNOSIS — B20 Human immunodeficiency virus [HIV] disease: Secondary | ICD-10-CM

## 2013-04-17 DIAGNOSIS — R7309 Other abnormal glucose: Secondary | ICD-10-CM

## 2013-04-17 NOTE — Progress Notes (Signed)
Patient ID: Matthew Cortez, male   DOB: [birth year], 53 y.o.   MRN: [id number] @LOGODEPT         Patient Active Problem List   Diagnosis Date Noted  . 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 HIV INFECTION 05/30/2007    Priority: High  . Hyperglycemia 04/17/2013  . Chronic diarrhea 04/17/2013  . 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 Hyperbilirubinemia, Tbili 2-2.5 11/10/2011  . CIGARETTE SMOKER 11/27/2009  . Chronic cholecystitis with calculus 10/22/2008  . DEGENERATIVE DISC DISEASE 12/26/2007  . BACK PAIN, LUMBAR, WITH RADICULOPATHY 10/05/2007    Patient's Medications  New Prescriptions   No medications on file  Previous Medications   DAPSONE 100 MG TABLET    Take 100 mg by mouth every morning.    DOLUTEGRAVIR SODIUM (TIVICAY) 50 MG TABS    Take 1 tablet (50 mg total) by mouth daily.   TRUVADA 200-300 MG PER TABLET    TAKE 1 TABLET BY MOUTH ONCE DAILY  Modified Medications   No medications on file  Discontinued Medications   DARUNAVIR ETHANOLATE (PREZISTA) 800 MG TABLET    Take 1 tablet (800 mg total) by mouth daily with breakfast.   PROMETHAZINE (PHENERGAN) 25 MG TABLET    Take 25 mg by mouth every 6 (six) hours as needed.   RITONAVIR (NORVIR) 100 MG TABS TABLET    Take 1 tablet (100 mg total) by mouth every morning.    Subjective: Matthew Cortez is in for his routine visit. 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 He has now been at his job for about 6 months and is no longer having to work 6 days a week. His stress level has improved. He is feeling better but is still bothered by chronic diarrhea related to his treatment for anal cancer.  Review of Systems: Constitutional: negative Eyes: negative Ears, nose, mouth, throat, and face: negative Respiratory: negative Cardiovascular:  negative Gastrointestinal: positive for diarrhea, negative for abdominal pain, change in bowel habits, constipation and nausea Genitourinary:negative  Past Medical History  Diagnosis Date  . HIV infection   . DDD (degenerative disc disease)   . Cholelithiasis   . Allergy   . History of radiation therapy 03/29/11- 05/07/11    EOT4/06/2011 anan ca, 52.2Gy  . DDD (degenerative disc disease)   . 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 Cancer 02/11/2011    squamous cell carcinoma of anus  . Anal condyloma   . Pneumonia 15 yrs ago    History  Substance Use Topics  . Smoking status: Former Smoker -- 0.25 packs/day for 20 years    Types: Cigarettes    Quit date: 03/07/2010  . Smokeless tobacco: Never Used  . Alcohol Use: 6.0 oz/week    12 drink(s) per week     Comment: 3 glasses of wine per week, none for past 2-3 mos    Family History  Problem Relation Age of Onset  . Cancer Mother     abdominal tumor  . Cancer Maternal Aunt     breast  . Diabetes Brother     Allergies  Allergen Reactions  . Doxycycline Hives    "Burned inside my body, lips split open and bled"  . Sulfonamide Derivatives Hives    "burned inside my body, inside my mouth"  . Amoxicillin  REACTION: rash  . Nevirapine     REACTION: Stevens-Johnson syndrome  . Penicillins     Objective: Temp: 98.2 F (36.8 C) (03/17 1507) Temp src: Oral (03/17 1507) BP: 124/84 mmHg (03/17 1507) Pulse Rate: 61 (03/17 1507) Body mass index is 27.72 kg/(m^2).  General: He is in good spirits Oral: No oropharyngeal lesions Skin: No rash Lungs: Clear Cor: Regular S1 and S2 no murmurs Abdomen: Soft and nontender  Lab Results Lab Results  Component Value Date   WBC 3.8* 03/05/2013   HGB 12.8* 03/05/2013   HCT 36.9* 03/05/2013   MCV 99.7 03/05/2013   PLT 227 03/05/2013    Lab Results  Component Value Date   CREATININE 1.10 03/05/2013   BUN 18 03/05/2013   NA 138 03/05/2013   K 4.4 03/05/2013   CL 106 03/05/2013   CO2 25  03/05/2013    Lab Results  Component Value Date   ALT 43 03/05/2013   AST 34 03/05/2013   ALKPHOS 75 03/05/2013   BILITOT 0.5 03/05/2013    Lab Results  Component Value Date   CHOL 185 03/22/2012   HDL 49 03/22/2012   LDLCALC 116* 03/22/2012   TRIG 98 03/22/2012   CHOLHDL 3.8 03/22/2012    Lab Results HIV 1 RNA Quant (copies/mL)  Date Value  03/05/2013 74*  10/12/2012 277*  06/27/2012 223*     CD4 T Cell Abs (/uL)  Date Value  03/05/2013 100*  10/12/2012 80*  06/27/2012 70*     Assessment: His adherence has improved over the past year and his viral load is coming down. He is having slow CD4 reconstitution. I will simplify his regimen by changing Prezista and Norvir to Prezcobix and continuing Truvada and Tivicay.  I suggested that he try a very low doses of Imodium on a regular basis to try to control his chronic diarrhea.  He has developed some hyperglycemia. I will recheck her glucose and A1c before his next visit.  Plan: 1. Simplify regimen to Truvada, Tivicay and Prezcobix 2. Low-dose Imodium as needed for diarrhea 3. Monitor hyperglycemia 4. Follow up after lab work in 3 months   Matthew Bickers, MD [facility] for [place] [phone] pager   [phone] cell 04/17/2013, 3:54 PM

## 2013-04-17 NOTE — Telephone Encounter (Signed)
Start Prezcobix, one by mouth daily, prn refills.  Discontinue Prezista and Norvir

## 2013-04-18 MED ORDER — DARUNAVIR-COBICISTAT 800-150 MG PO TABS: 1.0000 | ORAL_TABLET | Freq: Every day | ORAL | Status: DC

## 2013-04-18 NOTE — Addendum Note (Signed)
Addended by: Michel Bickers on: 04/18/2013 10:13 AM   Modules accepted: Orders

## 2013-05-15 ENCOUNTER — Other Ambulatory Visit: Payer: Self-pay | Admitting: *Deleted

## 2013-05-15 ENCOUNTER — Telehealth: Payer: Self-pay | Admitting: *Deleted

## 2013-05-15 ENCOUNTER — Other Ambulatory Visit: Payer: Self-pay | Admitting: Internal Medicine

## 2013-05-15 DIAGNOSIS — B20 Human immunodeficiency virus [HIV] disease: Secondary | ICD-10-CM

## 2013-05-15 MED ORDER — EMTRICITABINE-TENOFOVIR DF 200-300 MG PO TABS: ORAL_TABLET | ORAL | Status: DC

## 2013-05-15 MED ORDER — DAPSONE 100 MG PO TABS: 100.0000 mg | ORAL_TABLET | Freq: Every morning | ORAL | Status: DC

## 2013-05-15 MED ORDER — RITONAVIR 100 MG PO TABS: 100.0000 mg | ORAL_TABLET | Freq: Every day | ORAL | Status: DC

## 2013-05-15 MED ORDER — DARUNAVIR ETHANOLATE 800 MG PO TABS: 800.0000 mg | ORAL_TABLET | Freq: Every day | ORAL | Status: DC

## 2013-05-15 MED ORDER — DOLUTEGRAVIR SODIUM 50 MG PO TABS: 50.0000 mg | ORAL_TABLET | Freq: Every day | ORAL | Status: DC

## 2013-05-15 NOTE — Telephone Encounter (Signed)
Received refill request for Prezista.  Denied refill d/t change in regimen. Confirmed new regimen with pharmacist, as it changed just last month.  Per pharmacist, insurance denied the new Prezcobix.  RN contacted the insurance company to initiate a prior authorization.  Prezcobix is excluded on the patient's insurance - unable to do a prior authorization.  Please advise if patient should return to the Prezista/Norvir regimen. Landis Gandy, RN

## 2013-05-15 NOTE — Telephone Encounter (Signed)
Per Dr. Michel Bickers restart Prezista and Norvir due to [facility] not covering Prezcobix.  Sent all HIV rxes to [facility].  Pt informed.

## 2013-05-31 ENCOUNTER — Telehealth: Payer: Self-pay | Admitting: *Deleted

## 2013-05-31 NOTE — Telephone Encounter (Signed)
Pt  has not received rxes from new mail order pharmacy in [place].  Requesting help with this problem.  RN called pharmacy in [place].  Pharmacy had the rxes but had not been in touch with the pt to arrange deliver.  Supposedly the pharmacy had called the pt but he did not know anything about it.  Obtained a direct phone number  [phone] for the pt to call to arrange delivery.  Called the pt back and left a detailed message for him to call hopefully prior to 3 PM this afternoon so that the medication can be "overnighted" to him.

## 2013-06-18 ENCOUNTER — Encounter (INDEPENDENT_AMBULATORY_CARE_PROVIDER_SITE_OTHER): Payer: Self-pay | Admitting: Surgery

## 2013-06-18 ENCOUNTER — Ambulatory Visit (INDEPENDENT_AMBULATORY_CARE_PROVIDER_SITE_OTHER): Payer: 59 | Admitting: Surgery

## 2013-06-18 VITALS — BP 120/80 | HR 72 | Temp 98.0°F | Resp 14 | Ht 74.0 in | Wt 223.0 lb

## 2013-06-18 DIAGNOSIS — C210 Malignant neoplasm of anus, unspecified: Secondary | ICD-10-CM

## 2013-06-18 DIAGNOSIS — D013 Carcinoma in situ of anus and anal canal: Secondary | ICD-10-CM

## 2013-06-18 NOTE — Progress Notes (Signed)
Subjective:     Patient ID: Matthew Cortez, male   DOB: 09-12-1960, 53 y.o.   MRN: [id number]  HPI   Matthew Cortez  05-24-1960 [id number]  Patient Care Team: Michel Bickers, MD as PCP - General (Infectious Diseases) Michel Bickers, MD as PCP - Infectious Diseases (Infectious Diseases) Coral Spikes, MD as Consulting Physician (Gastroenterology) Marye Round, MD as Consulting Physician (Radiation Oncology)  This patient is a 53 y.o.male who presents today for surgical evaluation s/p excision of AIN 1-3 anal canal masses s/p Nigro protocol for anal cancer   REPORT OF SURGICAL PATHOLOGY FINAL DIAGNOSIS Diagnosis 1. Rectum, resection, right posterior 7 o'clock - HIGH GRADE SQUAMOUS INTRAEPITHELIAL LESION, AIN II-III (MODERATE TO SEVERE DYSPLASIA). - NO STROMAL INVASION IDENTIFIED. - INKED CAUTERIZED LATERAL MARGINS, NEGATIVE FOR DYSPLASIA OR MALIGNANCY. 2. Soft tissue mass, biopsy, rectum, posterior midline - LOW GRADE SQUAMOUS INTRAEPITHELIAL LESION, AIN-I (MILD DYSPLASIA). - POLYPOID FRAGMENTS OF COLONIC MUCOSA WITH ASSOCIATED SURFACE EROSION AND UNDERLYING STROMAL FIBROSIS. - BILATERAL MARGINS, NEGATIVE FOR DYSPLASIA OR MALIGNANCY. 3. Soft tissue mass, biopsy, rectum, left lateral - LOW GRADE SQUAMOUS INTRAEPITHELIAL LESION, AIN-I (MILD DYSPLASIA). - POLYPOID FRAGMENTS OF SQUAMOUS MUCOSA WITH UNDERLYING STROMAL FIBROSIS AND CHRONIC INFLAMMATION. - BILATERAL MARGINS, NEGATIVE FOR DYSPLASIA OR MALIGNANCY. Aldona Bar MD Pathologist, Electronic Signature (Case signed 10/25/2011)  The patient comes in today by himself.  Feeling fine.  He has been seeing Dr. Benay Spice from a medical oncology standpoint.  Daily soft bowel movements.  Energy level good.  No fevers or chills.  No burning or itching.  Overall in good spirits.  He is been eating normally without any problems.  He was interested in cholecystectomy but changed his mind.  Patient Active Problem List   Diagnosis Date Noted  .  Hyperglycemia 04/17/2013  . Chronic diarrhea 04/17/2013  . 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 Former smoker 11/27/2009  . Chronic cholecystitis with calculus 10/22/2008  . DEGENERATIVE DISC DISEASE 12/26/2007  . BACK PAIN, LUMBAR, WITH RADICULOPATHY 10/05/2007  . HIV INFECTION 05/30/2007    Past Medical History  Diagnosis Date  . HIV infection   . DDD (degenerative disc disease)   . Cholelithiasis   . Allergy   . History of radiation therapy 03/29/11- 05/07/11    EOT4/06/2011 anan ca, 52.2Gy  . DDD (degenerative disc disease)   . 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 Cancer 02/11/2011    squamous cell carcinoma of anus  . Anal condyloma   . Pneumonia 15 yrs ago    Past Surgical History  Procedure Laterality Date  . No past surgeries    . Rectal biopsy  10/22/2011    Procedure: BIOPSY RECTAL;  Surgeon: Adin Hector, MD;  Location: [facility];  Service: General;  Laterality: N/A;  . Colonoscopy  !/14/2014    [facility]    History   Social History  . Marital Status: Divorced    Spouse Name: N/A    Number of Children: N/A  . Years of Education: N/A   Occupational History  . Not on file.   Social History Main Topics  . Smoking status: Former Smoker -- 0.25 packs/day for 20 years    Types: Cigarettes    Quit date: 03/07/2010  . Smokeless tobacco: Never Used  . Alcohol Use: 6.0 oz/week    12 drink(s) per week  Comment: 3 glasses of wine per week, none for past 2-3 mos  . Drug Use: No     Comment: quit smoking 03/2010  . Sexual Activity: Not Currently     Comment: declined condoms   Other Topics Concern  . Not on file   Social History Narrative  . No narrative on file    Family History  Problem Relation Age of Onset  . Cancer Mother     abdominal tumor  . Cancer Maternal Aunt     breast  . Diabetes Brother   .  Kidney disease Father 39    kidney disease    Current Outpatient Prescriptions  Medication Sig Dispense Refill  . Darunavir Ethanolate (PREZISTA) 800 MG tablet Take 1 tablet (800 mg total) by mouth daily with breakfast.  90 tablet  3  . dolutegravir (TIVICAY) 50 MG tablet Take 1 tablet (50 mg total) by mouth daily.  90 tablet  3  . emtricitabine-tenofovir (TRUVADA) 200-300 MG per tablet TAKE 1 TABLET BY MOUTH ONCE DAILY  90 tablet  3  . ritonavir (NORVIR) 100 MG TABS tablet Take 1 tablet (100 mg total) by mouth daily with breakfast. Take with Prezista.  90 tablet  3  . dapsone 100 MG tablet Take 1 tablet (100 mg total) by mouth every morning.  90 tablet  3   No current facility-administered medications for this visit.     Allergies  Allergen Reactions  . Doxycycline Hives    "Burned inside my body, lips split open and bled"  . Sulfonamide Derivatives Hives    "burned inside my body, inside my mouth"  . Amoxicillin     REACTION: rash  . Nevirapine     REACTION: Stevens-Johnson syndrome  . Penicillins     BP 120/80  Pulse 72  Temp(Src) 98 F (36.7 C) (Temporal)  Resp 14  Ht 6\' 2"  (1.88 m)  Wt 223 lb (101.152 kg)  BMI 28.62 kg/m2  No results found.   Review of Systems  Constitutional: Negative for fever, chills and diaphoresis.  HENT: Negative for sore throat and trouble swallowing.   Eyes: Negative for photophobia and visual disturbance.  Respiratory: Negative for choking and shortness of breath.   Cardiovascular: Negative for chest pain and palpitations.  Gastrointestinal: Negative for nausea, vomiting, abdominal distention, anal bleeding and rectal pain.  Genitourinary: Negative for dysuria, urgency, difficulty urinating and testicular pain.  Musculoskeletal: Negative for arthralgias, gait problem, myalgias and neck pain.  Skin: Negative for color change and rash.  Neurological: Negative for dizziness, speech difficulty, weakness and numbness.  Hematological:  Negative for adenopathy.  Psychiatric/Behavioral: Negative for hallucinations, confusion and agitation.       Objective:   Physical Exam  Constitutional: He is oriented to person, place, and time. He appears well-developed and well-nourished. No distress.  HENT:  Head: Normocephalic.  Mouth/Throat: Oropharynx is clear and moist. No oropharyngeal exudate.  Eyes: Conjunctivae and EOM are normal. Pupils are equal, round, and reactive to light. No scleral icterus.  Neck: Normal range of motion. No tracheal deviation present.  Cardiovascular: Normal rate, normal heart sounds and intact distal pulses.   Pulmonary/Chest: Effort normal. No respiratory distress.  Abdominal: Soft. He exhibits no distension. There is no tenderness. Hernia confirmed negative in the right inguinal area and confirmed negative in the left inguinal area.  Incisions clean with normal healing ridges.  No hernias  Genitourinary:     Exam done with assistance of male Medical Assistant in the room.  Perianal skin clean with good hygiene.  No pruritis.  No external skin tags / hemorrhoids of significance.  No pilonidal disease.  No fissure.  No abscess/fistula.    Tolerates digital & anoscopic rectal exam.  Normal sphincter tone.  No significant scarring/stricture.  No stricture.  No epithelial changes abnormal.  Normal anal crypts  No concerning masses.  No evidence of recurrent disease   Musculoskeletal: Normal range of motion. He exhibits no tenderness.  Neurological: He is alert and oriented to person, place, and time. No cranial nerve deficit. He exhibits normal muscle tone. Coordination normal.  Skin: Skin is warm and dry. No rash noted. He is not diaphoretic.  Psychiatric: He has a normal mood and affect. His behavior is normal.       Assessment:     Anal cancer resolved after Joretta Bachelor 2013.  Focus of AIN III.  Background focus of AIN I.  No evidence of recurrent disease  Gallstones.  No attacks       Plan:     Return to clinic q1year until 5 years out to examine perianal region.    The anatomy & physiology of the anorectal region was discussed.  The pathophysiology of anorectal cancer & AIN and differential diagnosis was discussed.  Natural history risks without surgery was discussed such as further growth and cancer.   I stressed the importance of office follow-up to catch early recurrence & minimize/halt progression of disease.  Interventions such as cauterization by topical agents were discussed.  The patient's symptoms are not adequately controlled by non-operative treatments.  I feel the risks & problems of no surgery outweigh the operative risks; therefore, I recommended surgery to rule out recurrence of anal pathology & possibly treat by removal, ablation and/or cauterization.  Risks such as bleeding, infection, need for further treatment, heart attack, death, and other risks were discussed.   I noted a good likelihood this will help address the problem. Goals of post-operative recovery were discussed as well.  Possibility that this will not correct all symptoms was explained.  Post-operative pain, bleeding, constipation, and other problems after surgery were discussed.  We will work to minimize complications.   Educational handouts further explaining the pathology, treatment options, and bowel regimen were given as well.  Questions were answered.  The patient expresses understanding & wishes to proceed with surgery.  Keep appointment for 5 year colonoscopy = 2018  Because he has not had any attacks in the past year and he is feeling well, I did not force him consider cholecystectomy.  He has cancelled 2 prior scheduling attempts at operations already and is asymptomatic.  Perhaps is more severe attack was in some other etiology and not truly symptomatic gallstones.  If he does get recurrent attacks, reconsider surgery.  I think he is a reasonable single site candidate.  I did discuss the  procedure with him:  The anatomy & physiology of hepatobiliary & pancreatic function was discussed.  The pathophysiology of gallbladder dysfunction was discussed.  Natural history risks without surgery was discussed.   I feel the risks of no intervention will lead to serious problems that outweigh the operative risks; therefore, I recommended cholecystectomy to remove the pathology.  I explained laparoscopic techniques with possible need for an open approach.  Probable cholangiogram to evaluate the bilary tract was explained as well.    Risks such as bleeding, infection, abscess, leak, injury to other organs, need for further treatment, heart attack, death, and other risks were discussed.  I noted a good likelihood this will help address the problem.  Possibility that this will not correct all abdominal symptoms was explained.  Goals of post-operative recovery were discussed as well.  We will work to minimize complications.  An educational handout further explaining the pathology and treatment options was given as well.  Questions were answered.  The patient expresses understanding & wishes to proceed with surgery.

## 2013-06-18 NOTE — Patient Instructions (Signed)
Surgical (Rectal exam) follow-up after resection of AIN / condyloma acuminatum (warts): -every Year until negative exam x2, then -as needed thereafter  Anal Intraepithelial Neoplasia (AIN) ANAL INTRAEPITHELIAL NEOPLASIA (AIN) Anal Intra-epithelial Neoplasia (AIN) is a pre-cancerous condition of the skin surrounding the anus. In the early stages of AIN there are abnormal skin cells (epithelial cells) in the outer third of the skin (epithelium). This is called AIN1 (also called a low-grade anal squamous intra-epithelial lesion (LSIL). This can progress to AIN2, where the outer 2/3 of the skin contains abnormal cells, which in turn can result in AIN3 where there are abnormal cells involving the full thickness of skin (AIN2 and AIN3 are also called high-grade anal squamous intra-epithelial lesions (HSIL). The next step is for these abnormal cells to cross a barrier at the base of the skin called the basement membrane. Once this occurs, it is invasive cancer (carcinoma).  RISK FACTORS The risk factors for AIN are the same as for anal cancer, and include the immunosuppressed (eg HIV and transplant patients), and those with previous anal warts. PREVENTION The vaccine currently offered to women to reduce the risk of cervical cancer targets the Human Papilloma Virus (HPV) serotypes 16, 18, 6 and 11. Although not approved for this purpose, this vaccine may have a role for reducing the risk of AIN and anal cancer in high risk populations who do not yet have HPV infection. TREATMENT OF AIN Treatment is aimed at eradicating AIN and preventing anal cancer with minimal disturbance to anal function. There is currently no uniform treatment largely due to the uncertain natural history of AIN, the variable extent of disease, and the fact there is not a universally effective treatment modality.  TOPICAL AGENTS Imiquimod cream 5% (Aldara ) can be applied to the area 3 times a day, and can be used for up to 16 weeks. It  is an immunomodulatory agent and that attacks the virus responsible for warts, but also has anti-tumour effects [1]. There is some evidence that it not only slows the progression of AIN, but causes regression, with one study showing that 3/4 of men with AIN had their AIN completely cleared at the end of treatment[2]. It also has the added benefit in those with warts of causing regression of these, and in some cases eradication of the HPV virus. The main disadvantage is relapse in a quarter of cases after cessation of therapy[1]. Side effects of imiquimod include erythema, "flu?like" illness and erosions, which are usually mild. It should not be applied prior to sexual intercourse. A systematic review showed a noncompliance rate of less than 5% because of side effects, mainly related to incompatibility with sexual life [3-4]. Topical 5-Fluorouracil 5% cream (Efudix) can be applied to the area 1-2 times a day, and the largest study showing a recurrence of high grade AIN in only 8% of patients when used for 9-12 weeks[5]. PHOTODYNAMIC THERAPY Photodynamic therapy (PDT) involves the application of a cream (topical sensitizer) such as 5-Flourouracil (Efudix) and subsequent exposure of the anal region to light and oxygen to generate oxygen intermediates that damage areas with AIN [6]. Although there is little evidence, there is a suggestion that early AIN responds to this treatment [7-8]. INFRARED PHOTOCOAGULATION Infrared photocoagulation is the same as photodynamic therapy except that it only uses light with a wavelength longer than visible light. It's use for AIN was first described by Verner Chol and colleagues [9], who showed that up to 2/3 of AIN can be cured and is effective in  preventing progression to cancer.  SURGERY The treatment of widespread AIN3 is controversial. Most advocate surgery, however even with surgery there is a one third risk of recurrence [10-11]. If surgery is performed, it consists of  either local excision or extensive excision with split skin grafting. The high recurrence rate with these procedures is thought to be related to ongoing HPV, multi?focal lesions that are not all treated, and a generalised field change. [place] of the high recurrence rate and extensive surgery required for widespread AIN3, many advocate just close surveillance with regular (3-6 monthly) biopsies, so that early invasive anal cancer can be picked up early and treated accordingly. RADIOTHERAPY Radiotherapy has no role for AIN 3. Its only role is for confirmed anal cancer. FOLLOW-UP Follow?up should involve anoscopic examination, with or without the aid of high?resolution anoscopy. AIN 1 should be reviewed every 6?12 months with discharge from follow up upon resolution of AIN. AIN 3, especially in HIV?positive patients should be followed more closely every 3?6 months with or without treatment. The follow?up of AIN 2 is less clear, as the natural history of this condition is so uncertain. A regime somewhere between that of AIN 1 and 3 is advised, with HIV?positive or immunosuppressed patients being followed more like patients with AIN 3.

## 2013-07-05 ENCOUNTER — Other Ambulatory Visit: Payer: 59

## 2013-07-05 DIAGNOSIS — B20 Human immunodeficiency virus [HIV] disease: Secondary | ICD-10-CM

## 2013-07-05 LAB — COMPLETE METABOLIC PANEL WITH GFR
ALT: 24 U/L (ref 0–53)
AST: 36 U/L (ref 0–37)
Albumin: 4.3 g/dL (ref 3.5–5.2)
Alkaline Phosphatase: 55 U/L (ref 39–117)
BUN: 17 mg/dL (ref 6–23)
CO2: 25 meq/L (ref 19–32)
Calcium: 8.7 mg/dL (ref 8.4–10.5)
Chloride: 105 mEq/L (ref 96–112)
Creat: 1.05 mg/dL (ref 0.50–1.35)
GFR, Est Non African American: 81 mL/min
GLUCOSE: 87 mg/dL (ref 70–99)
Potassium: 4.3 mEq/L (ref 3.5–5.3)
SODIUM: 139 meq/L (ref 135–145)
TOTAL PROTEIN: 6.7 g/dL (ref 6.0–8.3)
Total Bilirubin: 0.7 mg/dL (ref 0.2–1.2)

## 2013-07-05 LAB — CBC
HCT: 36.8 % — ABNORMAL LOW (ref 39.0–52.0)
HEMOGLOBIN: 12.9 g/dL — AB (ref 13.0–17.0)
MCH: 34.9 pg — ABNORMAL HIGH (ref 26.0–34.0)
MCHC: 35.1 g/dL (ref 30.0–36.0)
MCV: 99.5 fL (ref 78.0–100.0)
Platelets: 217 10*3/uL (ref 150–400)
RBC: 3.7 MIL/uL — AB (ref 4.22–5.81)
RDW: 13.1 % (ref 11.5–15.5)
WBC: 3.9 10*3/uL — ABNORMAL LOW (ref 4.0–10.5)

## 2013-07-06 LAB — HEMOGLOBIN A1C
Hgb A1c MFr Bld: 5.2 % (ref <5.7)
Mean Plasma Glucose: 103 mg/dL (ref <117)

## 2013-07-06 LAB — T-HELPER CELL (CD4) - (RCID CLINIC ONLY)
CD4 % Helper T Cell: 8 % — ABNORMAL LOW (ref 33–55)
CD4 T CELL ABS: 120 /uL — AB (ref 400–2700)

## 2013-07-09 LAB — HIV-1 RNA QUANT-NO REFLEX-BLD
HIV 1 RNA Quant: 20 copies/mL (ref <20)
HIV-1 RNA Quant, Log: 1.3 {Log} (ref <1.30)

## 2013-07-19 ENCOUNTER — Encounter: Payer: Self-pay | Admitting: Internal Medicine

## 2013-07-19 ENCOUNTER — Ambulatory Visit (INDEPENDENT_AMBULATORY_CARE_PROVIDER_SITE_OTHER): Payer: 59 | Admitting: Internal Medicine

## 2013-07-19 VITALS — BP 128/82 | HR 58 | Temp 97.7°F | Wt 218.5 lb

## 2013-07-19 DIAGNOSIS — B20 Human immunodeficiency virus [HIV] disease: Secondary | ICD-10-CM

## 2013-07-19 NOTE — Progress Notes (Signed)
Patient ID: Matthew Cortez, male   DOB: January 17, 1961, 53 y.o.   MRN: [id number]          Patient Active Problem List   Diagnosis Date Noted  . 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 HIV INFECTION 05/30/2007    Priority: High  . Hyperglycemia 04/17/2013  . Chronic diarrhea 04/17/2013  . 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 Former smoker 11/27/2009  . Chronic cholecystitis with calculus 10/22/2008  . DEGENERATIVE DISC DISEASE 12/26/2007  . BACK PAIN, LUMBAR, WITH RADICULOPATHY 10/05/2007    Patient's Medications  New Prescriptions   No medications on file  Previous Medications   DAPSONE 100 MG TABLET    Take 1 tablet (100 mg total) by mouth every morning.   DARUNAVIR ETHANOLATE (PREZISTA) 800 MG TABLET    Take 1 tablet (800 mg total) by mouth daily with breakfast.   DIPHENOXYLATE-ATROPINE (LOMOTIL) 2.5-0.025 MG PER TABLET    Take 1 tablet by mouth 4 (four) times daily as needed for diarrhea or loose stools.   DOLUTEGRAVIR (TIVICAY) 50 MG TABLET    Take 1 tablet (50 mg total) by mouth daily.   EMTRICITABINE-TENOFOVIR (TRUVADA) 200-300 MG PER TABLET    TAKE 1 TABLET BY MOUTH ONCE DAILY   RITONAVIR (NORVIR) 100 MG TABS TABLET    Take 1 tablet (100 mg total) by mouth daily with breakfast. Take with Prezista.  Modified Medications   No medications on file  Discontinued Medications   No medications on file    Subjective: Matthew Cortez is in for his routine visit. His insurance company denied to switch to Prezcobix so he remains on Truvada, Tivicay, Prezista and Norvir. He missed 2 weeks of his medications with his pharmacy was slow to refill them but he is now worked out [employer] and is managing his copayments with co-pay cards. He is feeling well and has recently had a good checkup with Matthew Cortez and has no signs of recurrent anal cancer. He is scheduled to see his  oncologist, Matthew Cortez, next week.  Review of Systems: Pertinent items are noted in HPI.  Past Medical History  Diagnosis Date  . HIV infection   . DDD (degenerative disc disease)   . Cholelithiasis   . Allergy   . History of radiation therapy 03/29/11- 05/07/11    EOT4/06/2011 anan ca, 52.2Gy  . DDD (degenerative disc disease)   . 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 Cancer 02/11/2011    squamous cell carcinoma of anus  . Anal condyloma   . Pneumonia 15 yrs ago    History  Substance Use Topics  . Smoking status: Former Smoker -- 0.25 packs/day for 20 years    Types: Cigarettes    Quit date: 03/07/2010  . Smokeless tobacco: Never Used  . Alcohol Use: 6.0 oz/week    12 drink(s) per week     Comment: 3 glasses of wine per week, none for past 2-3 mos    Family History  Problem Relation Age of Onset  . Cancer Mother     abdominal tumor  . Cancer Maternal Aunt     breast  . Diabetes Brother   . Kidney disease Father 67    kidney disease    Allergies  Allergen Reactions  . Doxycycline Hives    "Burned inside my body, lips split open and bled"  .  Sulfonamide Derivatives Hives    "burned inside my body, inside my mouth"  . Amoxicillin     REACTION: rash  . Nevirapine     REACTION: Stevens-Johnson syndrome  . Penicillins     Objective: Temp: 97.7 F (36.5 C) (06/18 1527) Temp src: Oral (06/18 1527) BP: 128/82 mmHg (06/18 1527) Pulse Rate: 58 (06/18 1527) Body mass index is 28.04 kg/(m^2).  General: He is in good spirits Oral: No oropharyngeal lesions Skin: No rash Lungs: Clear Cor: Regular S1 and S2 with no murmurs  Lab Results Lab Results  Component Value Date   WBC 3.9* 07/05/2013   HGB 12.9* 07/05/2013   HCT 36.8* 07/05/2013   MCV 99.5 07/05/2013   PLT 217 07/05/2013    Lab Results  Component Value Date   CREATININE 1.05 07/05/2013   BUN 17 07/05/2013   NA 139 07/05/2013   K 4.3 07/05/2013   CL 105 07/05/2013   CO2 25 07/05/2013    Lab Results    Component Value Date   ALT 24 07/05/2013   AST 36 07/05/2013   ALKPHOS 55 07/05/2013   BILITOT 0.7 07/05/2013    Lab Results  Component Value Date   CHOL 185 03/22/2012   HDL 49 03/22/2012   LDLCALC 116* 03/22/2012   TRIG 98 03/22/2012   CHOLHDL 3.8 03/22/2012    Lab Results HIV 1 RNA Quant (copies/mL)  Date Value  07/05/2013 20   03/05/2013 74*  10/12/2012 277*     CD4 T Cell Abs (/uL)  Date Value  07/05/2013 120*  03/05/2013 100*  10/12/2012 80*     Assessment: His HIV infection has come under much better control in the past 2 years and is starting to have some CD4 reconstitution. I will continue his current 4 drug regimen.  Plan: 1. Continue current antiretroviral regimen  2. Followup after lab work in 3 months   Matthew Bickers, MD [facility] for [place] [phone] pager   [phone] cell 07/19/2013, 3:42 PM

## 2013-07-27 ENCOUNTER — Ambulatory Visit (HOSPITAL_BASED_OUTPATIENT_CLINIC_OR_DEPARTMENT_OTHER): Payer: 59 | Admitting: Oncology

## 2013-07-27 VITALS — BP 132/86 | HR 63 | Temp 98.5°F | Resp 18 | Ht 74.0 in | Wt 219.0 lb

## 2013-07-27 DIAGNOSIS — Z85048 Personal history of other malignant neoplasm of rectum, rectosigmoid junction, and anus: Secondary | ICD-10-CM

## 2013-07-27 DIAGNOSIS — C210 Malignant neoplasm of anus, unspecified: Secondary | ICD-10-CM

## 2013-07-27 DIAGNOSIS — B20 Human immunodeficiency virus [HIV] disease: Secondary | ICD-10-CM

## 2013-07-27 NOTE — Progress Notes (Signed)
  [facility] OFFICE PROGRESS NOTE   Diagnosis: Anal cancer  INTERVAL HISTORY:   He returns as scheduled. He feels well. No anal problem, no bleeding. He sees Dr. Megan Salon every 3 months and reports the most recent CD4 count was higher. He saw Dr. Johney Maine 06/18/2013 and an anal/rectal exam revealed no evidence of recurrent tumor.  Objective:  Vital signs in last 24 hours:  Blood pressure 132/86, pulse 63, temperature 98.5 F (36.9 C), temperature source Oral, resp. rate 18, height 6\' 2"  (1.88 m), weight 219 lb (99.338 kg), SpO2 100.00%.    HEENT: Neck without mass Lymphatics: No cervical, supra-clavicular, axillary, inguinal, or femoral nodes Resp: Lungs clear bilaterally Cardio: Regular rate and rhythm GI: No hepatosplenomegaly, nontender, no mass Vascular: No leg edema   Medications: I have reviewed the patient's current medications.  Assessment/Plan: 1.Anal cancer-status post 5-FU/mitomycin-C and concurrent radiation given between 03/09/2011 and 05/07/2011, negative restaging CT of the abdomen and pelvis on 10/16/2012  2. Anal intraepithelial neoplasia-followed by Dr. Johney Maine, status post multiple biopsies in September 2013  3. HIV infection    Disposition:  He remains in clinical remission from anal cancer. He sees Dr. Megan Salon every 3 months and will continue anorectal examinations with Dr. Johney Maine. Mr. [place] is not scheduled for a followup appointment in the Oncology clinic. We will see him in the future as needed.  Betsy Coder, MD  07/27/2013  3:23 PM

## 2013-10-09 ENCOUNTER — Other Ambulatory Visit: Payer: 59

## 2013-10-09 DIAGNOSIS — B20 Human immunodeficiency virus [HIV] disease: Secondary | ICD-10-CM

## 2013-10-09 LAB — COMPLETE METABOLIC PANEL WITH GFR
ALT: 19 U/L (ref 0–53)
AST: 24 U/L (ref 0–37)
Albumin: 4.5 g/dL (ref 3.5–5.2)
Alkaline Phosphatase: 56 U/L (ref 39–117)
BUN: 14 mg/dL (ref 6–23)
CO2: 26 meq/L (ref 19–32)
Calcium: 9 mg/dL (ref 8.4–10.5)
Chloride: 107 mEq/L (ref 96–112)
Creat: 1.14 mg/dL (ref 0.50–1.35)
GFR, EST AFRICAN AMERICAN: 85 mL/min
GFR, EST NON AFRICAN AMERICAN: 74 mL/min
GLUCOSE: 92 mg/dL (ref 70–99)
POTASSIUM: 4.5 meq/L (ref 3.5–5.3)
SODIUM: 140 meq/L (ref 135–145)
TOTAL PROTEIN: 6.7 g/dL (ref 6.0–8.3)
Total Bilirubin: 0.5 mg/dL (ref 0.2–1.2)

## 2013-10-10 LAB — T-HELPER CELL (CD4) - (RCID CLINIC ONLY)
CD4 % Helper T Cell: 10 % — ABNORMAL LOW (ref 33–55)
CD4 T Cell Abs: 130 /uL — ABNORMAL LOW (ref 400–2700)

## 2013-10-10 LAB — HIV-1 RNA QUANT-NO REFLEX-BLD
HIV 1 RNA Quant: <20 copies/mL (ref <20)
HIV-1 RNA Quant, Log: <1.3 {Log} (ref <1.30)

## 2013-10-23 ENCOUNTER — Encounter: Payer: Self-pay | Admitting: Internal Medicine

## 2013-10-23 ENCOUNTER — Ambulatory Visit (INDEPENDENT_AMBULATORY_CARE_PROVIDER_SITE_OTHER): Payer: 59 | Admitting: Internal Medicine

## 2013-10-23 VITALS — BP 124/81 | HR 49 | Temp 98.1°F | Ht 74.0 in | Wt 219.0 lb

## 2013-10-23 DIAGNOSIS — B20 Human immunodeficiency virus [HIV] disease: Secondary | ICD-10-CM

## 2013-10-23 DIAGNOSIS — Z23 Encounter for immunization: Secondary | ICD-10-CM

## 2013-10-23 NOTE — Progress Notes (Signed)
Patient ID: Matthew Cortez, male   DOB: [birth year], 53 y.o.   MRN: [id number]          Patient Active Problem List   Diagnosis Date Noted  . 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 HIV INFECTION 05/30/2007    Priority: High  . Hyperglycemia 04/17/2013  . Chronic diarrhea 04/17/2013  . 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 Former smoker 11/27/2009  . Chronic cholecystitis with calculus 10/22/2008  . DEGENERATIVE DISC DISEASE 12/26/2007  . BACK PAIN, LUMBAR, WITH RADICULOPATHY 10/05/2007    Patient's Medications  New Prescriptions   No medications on file  Previous Medications   DAPSONE 100 MG TABLET    Take 1 tablet (100 mg total) by mouth every morning.   DARUNAVIR ETHANOLATE (PREZISTA) 800 MG TABLET    Take 1 tablet (800 mg total) by mouth daily with breakfast.   DOLUTEGRAVIR (TIVICAY) 50 MG TABLET    Take 1 tablet (50 mg total) by mouth daily.   EMTRICITABINE-TENOFOVIR (TRUVADA) 200-300 MG PER TABLET    TAKE 1 TABLET BY MOUTH ONCE DAILY   RITONAVIR (NORVIR) 100 MG TABS TABLET    Take 1 tablet (100 mg total) by mouth daily with breakfast. Take with Prezista.  Modified Medications   No medications on file  Discontinued Medications   No medications on file    Subjective: Matthew Cortez is in for his routine visit. He has had no problem obtaining his HIV medications and he denies missing any doses. He generally takes them in the morning with crackers. He still has one bout of diarrhea daily but this is unchanged. Overall he is feeling much better. Review of Systems: Pertinent items are noted in HPI.  Past Medical History  Diagnosis Date  . HIV infection   . DDD (degenerative disc disease)   . Cholelithiasis   . Allergy   . History of radiation therapy 03/29/11- 05/07/11    EOT4/06/2011 anan ca, 52.2Gy  . DDD (degenerative disc disease)   . Squamous cell carcinoma of  anus 03/08/2011  . Cancer 02/11/2011    squamous cell carcinoma of anus  . Anal condyloma   . Pneumonia 15 yrs ago    History  Substance Use Topics  . Smoking status: Former Smoker -- 0.25 packs/day for 20 years    Types: Cigarettes    Quit date: 03/07/2010  . Smokeless tobacco: Never Used  . Alcohol Use: 6.0 oz/week    12 drink(s) per week     Comment: 3 glasses of wine per week, none for past 2-3 mos    Family History  Problem Relation Age of Onset  . Cancer Mother     abdominal tumor  . Cancer Maternal Aunt     breast  . Diabetes Brother   . Kidney disease Father 57    kidney disease    Allergies  Allergen Reactions  . Doxycycline Hives    "Burned inside my body, lips split open and bled"  . Sulfonamide Derivatives Hives    "burned inside my body, inside my mouth"  . Amoxicillin     REACTION: rash  . Nevirapine     REACTION: Stevens-Johnson syndrome  . Penicillins     Objective: Temp: 98.1 F (36.7 C) (09/22 1048) Temp src: Oral (09/22 1048) BP: 124/81 mmHg (09/22 1048) Pulse Rate: 49 (09/22 1048) Body mass index is 28.[place]  kg/(m^2).  General: He is in good spirits Oral: No oropharyngeal lesions Skin: No rash Lungs: Clear Cor: Regular S1 and S2 no murmurs Abdomen: Soft and nontender  Lab Results Lab Results  Component Value Date   WBC 3.9* 07/05/2013   HGB 12.9* 07/05/2013   HCT 36.8* 07/05/2013   MCV 99.5 07/05/2013   PLT 217 07/05/2013    Lab Results  Component Value Date   CREATININE 1.14 10/09/2013   BUN 14 10/09/2013   NA 140 10/09/2013   K 4.5 10/09/2013   CL 107 10/09/2013   CO2 26 10/09/2013    Lab Results  Component Value Date   ALT 19 10/09/2013   AST 24 10/09/2013   ALKPHOS 56 10/09/2013   BILITOT 0.5 10/09/2013    Lab Results  Component Value Date   CHOL 185 03/22/2012   HDL 49 03/22/2012   LDLCALC 116* 03/22/2012   TRIG 98 03/22/2012   CHOLHDL 3.8 03/22/2012    Lab Results HIV 1 RNA Quant (copies/mL)  Date Value  10/09/2013 <20   07/05/2013 20     03/05/2013 74*     CD4 T Cell Abs (/uL)  Date Value  10/09/2013 130*  07/05/2013 120*  03/05/2013 100*     Assessment: His HIV infection and is now completely suppressed and he is having slow CD4 reconstitution. His insurance plan is not yet approved conversion of Prezista and Norvir to Prezcobix so I will continue his current 4 drug regimen.  Plan: 1. Continue current antiretroviral regimen and dapsone 2. Followup after lab work in [place] months   Michel Bickers, MD [facility] for [place] [phone] pager   [phone] cell 10/23/2013, 11:11 AM

## 2014-02-04 IMAGING — CT CT ABD-PELV W/ CM
2 of 5 series · 17 of 46 positions shown, 19 images · IV contrast (OMNIPAQUE)
Comparison: PET-CT scan 08/09/2011, CT 04/10/2012

CLINICAL DATA: Renal cancer diagnosed February 2011. Radiation
therapy and chemotherapy.

EXAM:
CT CHEST, ABDOMEN, AND PELVIS WITH CONTRAST
TECHNIQUE: Multidetector CT imaging of the chest, abdomen and pelvis was
performed following the standard protocol during bolus
administration of intravenous contrast.
CONTRAST:  100mL OMNIPAQUE IOHEXOL 300 MG/ML  SOLN

[Series 2: cap with st · axial · 0.73mm/px · z∈[-657,-57]mm · 14 of 138 slices shown, 16 images]
[im 9/138  soft-tissue]
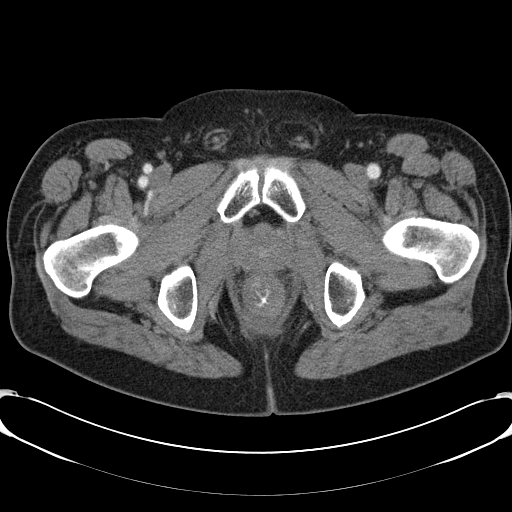
[im 9/138  bone]
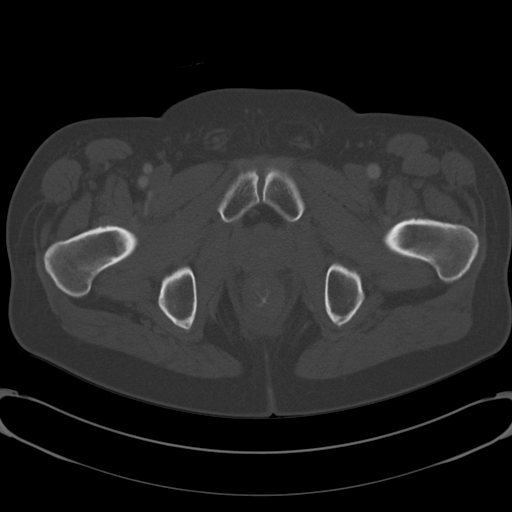
[im 17/138  soft-tissue]
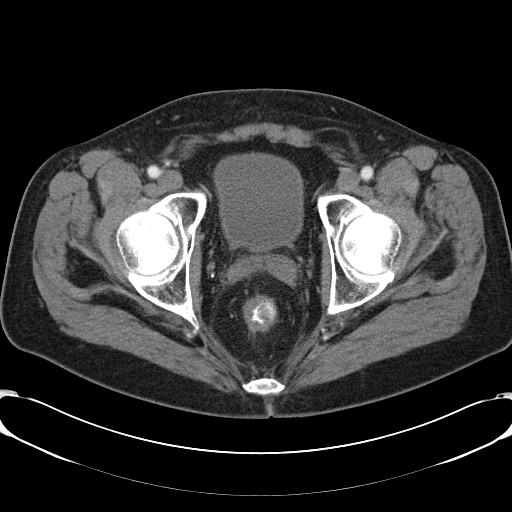
[im 25/138  soft-tissue]
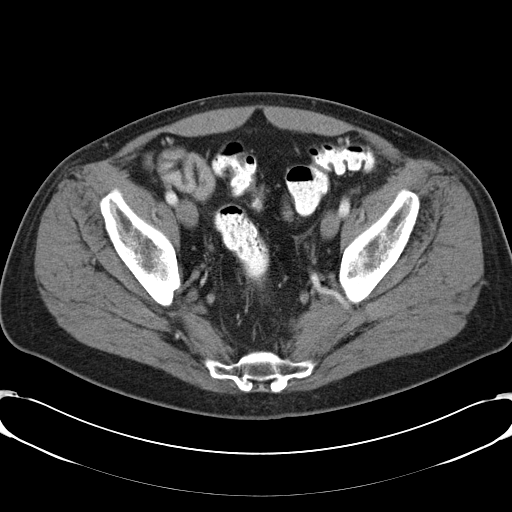
[im 41/138  soft-tissue]
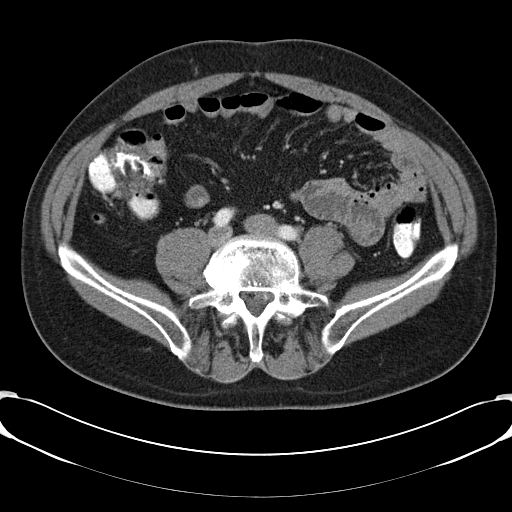
[im 49/138  soft-tissue]
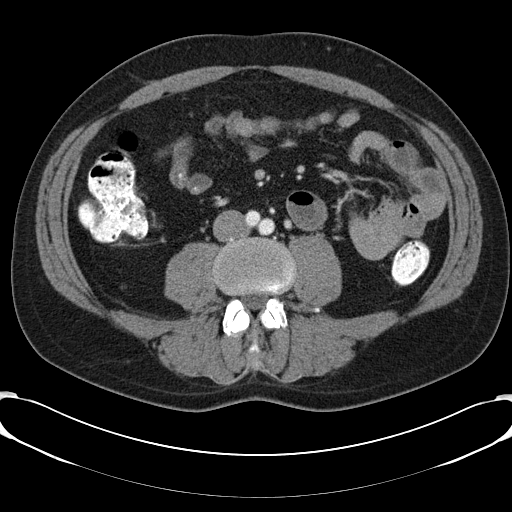
[im 57/138  soft-tissue]
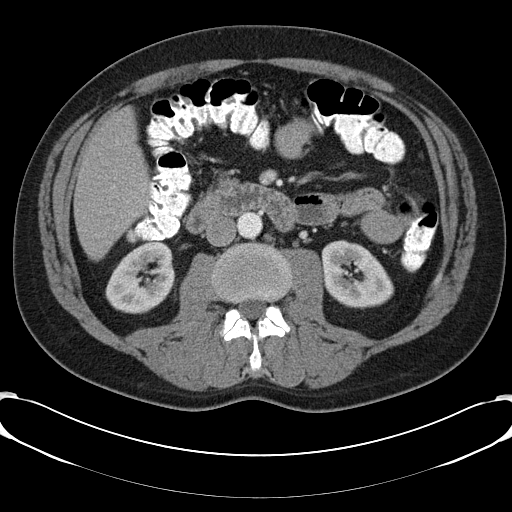
[im 65/138  soft-tissue]
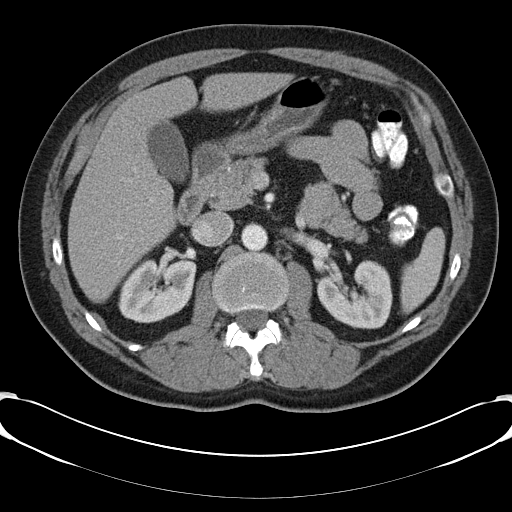
[im 73/138  soft-tissue]
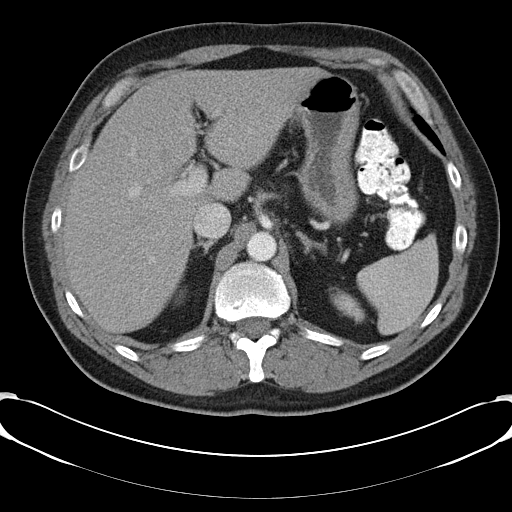
[im 81/138  soft-tissue]
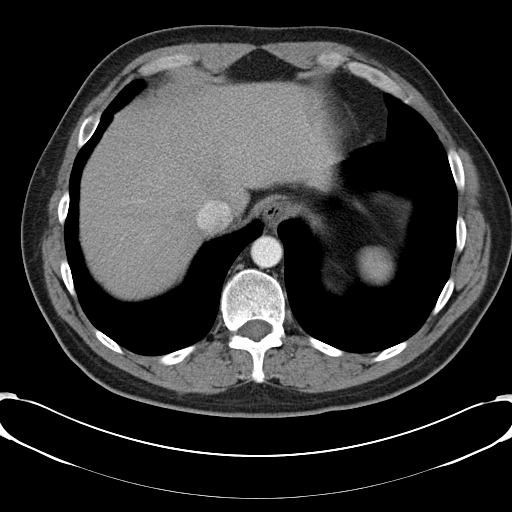
[im 81/138  bone]
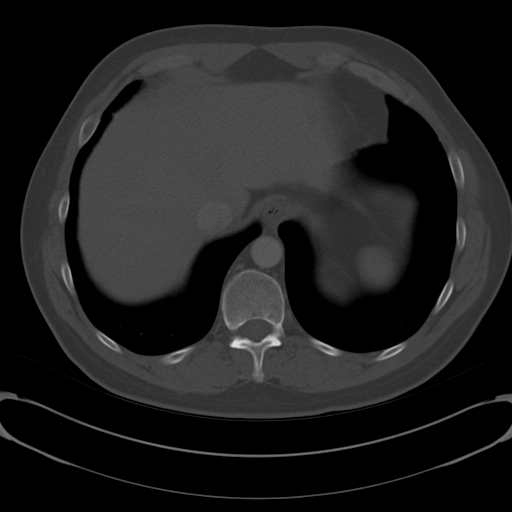
[im 89/138  soft-tissue]
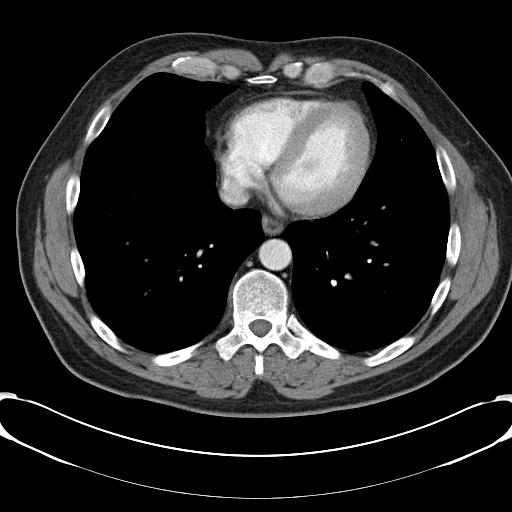
[im 105/138  soft-tissue]
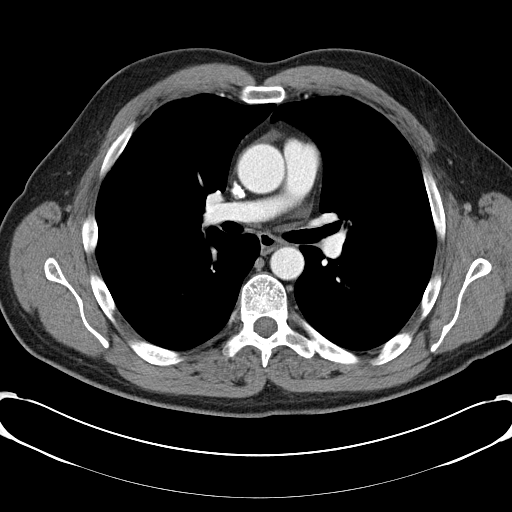
[im 113/138  soft-tissue]
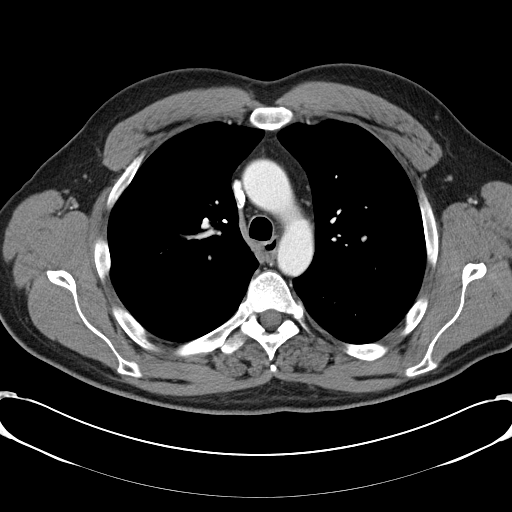
[im 121/138  soft-tissue]
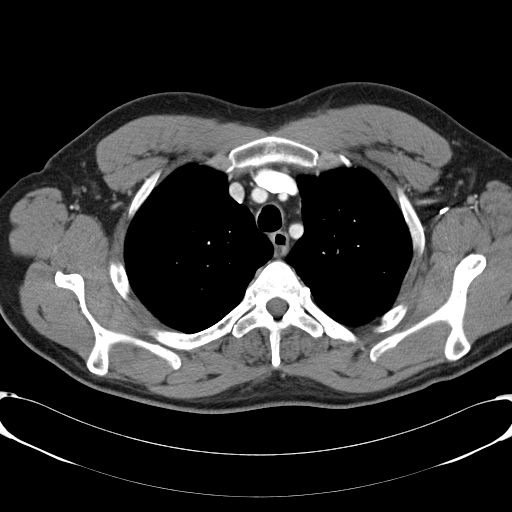
[im 129/138  soft-tissue]
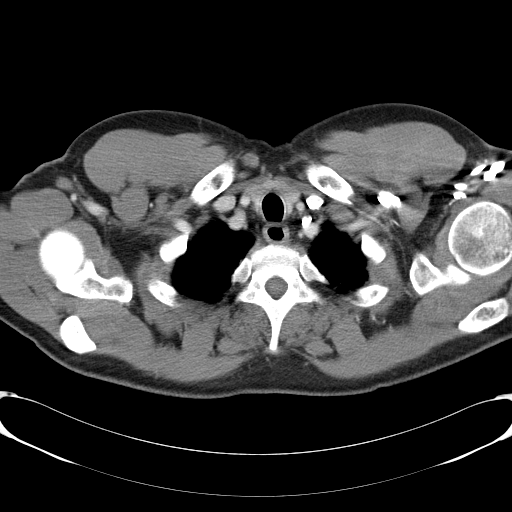

[Series 602: <mpr thick range> · coronal · 1.35mm/px · 3 of 95 slices shown]
[im 32/95  soft-tissue]
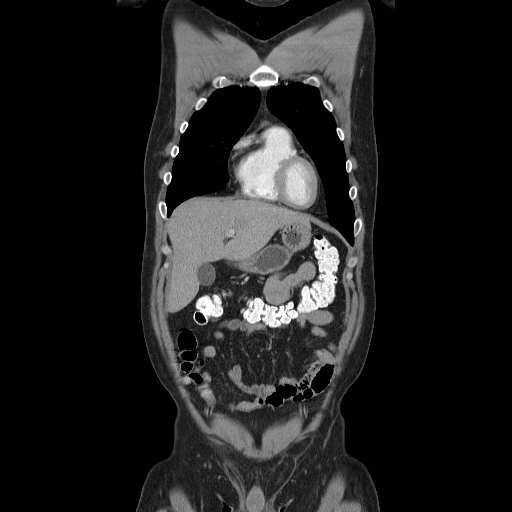
[im 42/95  soft-tissue]
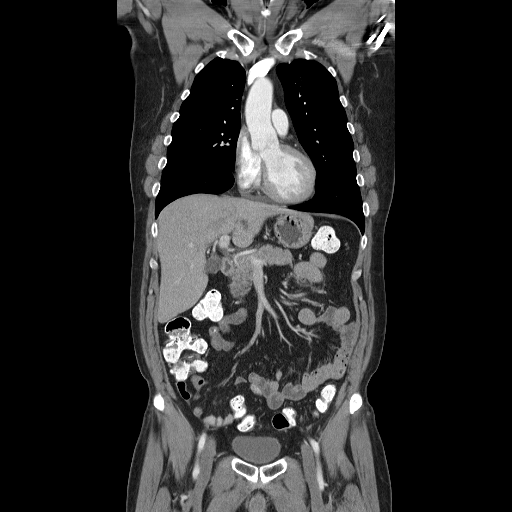
[im 53/95  soft-tissue]
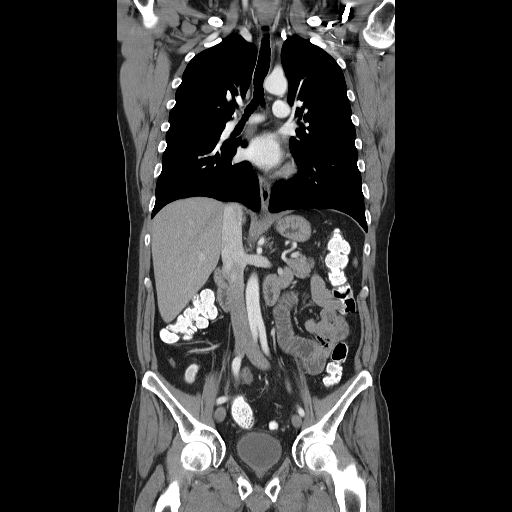

[17 of 46 positions shown; findings below may reference images not displayed]

FINDINGS: CT CHEST FINDINGS

No axillary or supraclavicular lymphadenopathy. No mediastinal or
hilar lymphadenopathy. No pericardial fluid. Esophagus is normal.
Review of the lung a parenchyma demonstrates no suspicious pulmonary
nodules. Trace apical nodular thickening. Airways are normal.

CT ABDOMEN AND PELVIS FINDINGS

No focal hepatic lesion. The gallbladder, pancreas, spleen, adrenal
glands, and kidneys are normal.

The stomach, small bowel, appendix, cecum normal. The colon and
rectosigmoid colon are normal.

Abdominal aorta is normal in caliber. No retroperitoneal or
periportal lymphadenopathy. Small 6 mm left periaortic lymph node
(image 81) is not pathologic by size criteria. Stable from
comparisons.

Within the pelvis, there is no perirectal lymphadenopathy. No iliac
lymphadenopathy.

There is no nodularity along the rectum or anus.

Prostate gland and bladder are normal. Review of bone windows
demonstrates no aggressive osseous lesions.
IMPRESSION: CT CHEST IMPRESSION

1.  No evidence of thoracic metastasis.

CT ABDOMEN AND PELVIS IMPRESSION

1.  No evidence of local anal cancer recurrence.

2. No evidence of pelvic lymphadenopathy.

3. Stable small left periaortic lymph node is not pathologic by size
criteria.

## 2014-04-08 ENCOUNTER — Other Ambulatory Visit: Payer: 59

## 2014-04-08 DIAGNOSIS — B20 Human immunodeficiency virus [HIV] disease: Secondary | ICD-10-CM

## 2014-04-08 DIAGNOSIS — Z113 Encounter for screening for infections with a predominantly sexual mode of transmission: Secondary | ICD-10-CM

## 2014-04-08 DIAGNOSIS — Z79899 Other long term (current) drug therapy: Secondary | ICD-10-CM

## 2014-04-08 LAB — COMPREHENSIVE METABOLIC PANEL
ALBUMIN: 4.3 g/dL (ref 3.5–5.2)
ALT: 21 U/L (ref 0–53)
AST: 23 U/L (ref 0–37)
Alkaline Phosphatase: 57 U/L (ref 39–117)
BUN: 14 mg/dL (ref 6–23)
CO2: 28 mEq/L (ref 19–32)
Calcium: 8.9 mg/dL (ref 8.4–10.5)
Chloride: 105 mEq/L (ref 96–112)
Creat: 0.86 mg/dL (ref 0.50–1.35)
Glucose, Bld: 90 mg/dL (ref 70–99)
POTASSIUM: 4.4 meq/L (ref 3.5–5.3)
SODIUM: 140 meq/L (ref 135–145)
TOTAL PROTEIN: 6.6 g/dL (ref 6.0–8.3)
Total Bilirubin: 0.9 mg/dL (ref 0.2–1.2)

## 2014-04-08 LAB — LIPID PANEL
CHOL/HDL RATIO: 3 ratio
Cholesterol: 208 mg/dL — ABNORMAL HIGH (ref 0–200)
HDL: 69 mg/dL (ref >=40)
LDL Cholesterol: 126 mg/dL — ABNORMAL HIGH (ref 0–99)
Triglycerides: 67 mg/dL (ref <150)
VLDL: 13 mg/dL (ref 0–40)

## 2014-04-08 LAB — CBC
HCT: 35.6 % — ABNORMAL LOW (ref 39.0–52.0)
HEMOGLOBIN: 12.4 g/dL — AB (ref 13.0–17.0)
MCH: 35.3 pg — ABNORMAL HIGH (ref 26.0–34.0)
MCHC: 34.8 g/dL (ref 30.0–36.0)
MCV: 101.4 fL — ABNORMAL HIGH (ref 78.0–100.0)
MPV: 8.3 fL — AB (ref 8.6–12.4)
PLATELETS: 226 10*3/uL (ref 150–400)
RBC: 3.51 MIL/uL — ABNORMAL LOW (ref 4.22–5.81)
RDW: 13.2 % (ref 11.5–15.5)
WBC: 3.9 10*3/uL — ABNORMAL LOW (ref 4.0–10.5)

## 2014-04-09 LAB — RPR

## 2014-04-10 LAB — HIV-1 RNA QUANT-NO REFLEX-BLD: HIV 1 RNA Quant: <20 copies/mL (ref <20)

## 2014-04-10 LAB — T-HELPER CELL (CD4) - (RCID CLINIC ONLY)
CD4 % Helper T Cell: 13 % — ABNORMAL LOW (ref 33–55)
CD4 T CELL ABS: 140 /uL — AB (ref 400–2700)

## 2014-04-23 ENCOUNTER — Encounter: Payer: Self-pay | Admitting: Internal Medicine

## 2014-04-23 ENCOUNTER — Ambulatory Visit (INDEPENDENT_AMBULATORY_CARE_PROVIDER_SITE_OTHER): Payer: 59 | Admitting: Internal Medicine

## 2014-04-23 DIAGNOSIS — B20 Human immunodeficiency virus [HIV] disease: Secondary | ICD-10-CM

## 2014-04-23 MED ORDER — DARUNAVIR-COBICISTAT 800-150 MG PO TABS: 1.0000 | ORAL_TABLET | Freq: Every day | ORAL | Status: DC

## 2014-04-23 NOTE — Progress Notes (Signed)
Patient ID: Matthew Cortez, male   DOB: [birth year], 54 y.o.   MRN: [id number]          Patient Active Problem List   Diagnosis Date Noted  . 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 Human immunodeficiency virus (HIV) disease 05/30/2007    Priority: High  . Hyperglycemia 04/17/2013  . Chronic diarrhea 04/17/2013  . 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 Former smoker 11/27/2009  . Chronic cholecystitis with calculus 10/22/2008  . DEGENERATIVE DISC DISEASE 12/26/2007  . BACK PAIN, LUMBAR, WITH RADICULOPATHY 10/05/2007    Patient's Medications  New Prescriptions   DARUNAVIR-COBICISTAT (PREZCOBIX) 800-150 MG PER TABLET    Take 1 tablet by mouth daily. Swallow whole. Do NOT crush, break or chew tablets. Take with food.  Previous Medications   DAPSONE 100 MG TABLET    Take 1 tablet (100 mg total) by mouth every morning.   DOLUTEGRAVIR (TIVICAY) 50 MG TABLET    Take 1 tablet (50 mg total) by mouth daily.   EMTRICITABINE-TENOFOVIR (TRUVADA) 200-300 MG PER TABLET    TAKE 1 TABLET BY MOUTH ONCE DAILY  Modified Medications   No medications on file  Discontinued Medications   DARUNAVIR ETHANOLATE (PREZISTA) 800 MG TABLET    Take 1 tablet (800 mg total) by mouth daily with breakfast.   RITONAVIR (NORVIR) 100 MG TABS TABLET    Take 1 tablet (100 mg total) by mouth daily with breakfast. Take with Prezista.    Subjective: Marvel[place] is in for his routine visit. He has had no problems obtaining, taking or tolerating his HIV medications since his last visit. He does not recall missing any doses. He is no longer getting any regular exercise. He does get 2 days off each week. His father struggled with obesity and heart disease and is no longer living. This makes [place] concerned about his weight gain.  Review of Systems: Constitutional: negative Eyes: negative Ears, nose, mouth,  throat, and face: negative Respiratory: negative Cardiovascular: negative Gastrointestinal: negative Genitourinary:negative  Past Medical History  Diagnosis Date  . HIV infection   . DDD (degenerative disc disease)   . Cholelithiasis   . Allergy   . History of radiation therapy 03/29/11- 05/07/11    EOT4/06/2011 anan ca, 53.2Gy  . DDD (degenerative disc disease)   . 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 Cancer 02/11/2011    squamous cell carcinoma of anus  . Anal condyloma   . Pneumonia 15 yrs ago    History  Substance Use Topics  . Smoking status: Former Smoker -- 0.25 packs/day for 20 years    Types: Cigarettes    Quit date: 03/07/2010  . Smokeless tobacco: Never Used  . Alcohol Use: 6.0 oz/week    12 drink(s) per week     Comment: 3 glasses of wine per week, none for past 2-3 mos    Family History  Problem Relation Age of Onset  . Cancer Mother     abdominal tumor  . Cancer Maternal Aunt     breast  . Diabetes Brother   . Kidney disease Father 39    kidney disease  . Heart disease Father     Allergies  Allergen Reactions  . Doxycycline Hives    "Burned inside my body, lips split open and bled"  . Sulfonamide Derivatives Hives    "burned inside my body,  inside my mouth"  . Amoxicillin     REACTION: rash  . Nevirapine     REACTION: Stevens-Johnson syndrome  . Penicillins     Objective: Temp: 98 F (36.7 C) (03/22 1050) Temp Source: Oral (03/22 1050) BP: 146/84 mmHg (03/22 1050) Pulse Rate: 54 (03/22 1050) Body mass index is 28.46 kg/(m^2).  General: His weight is up to 228 Oral: No oropharyngeal lesions Skin: No rash Lungs: Clear Cor: Regular S1 and S2 with no murmurs Abdomen: Soft and nontender Mood: Normal. He does not appear depressed or anxious  Lab Results Lab Results  Component Value Date   WBC 3.9* 04/08/2014   HGB 12.4* 04/08/2014   HCT 35.6* 04/08/2014   MCV 101.4* 04/08/2014   PLT 226 04/08/2014    Lab Results  Component  Value Date   CREATININE 0.86 04/08/2014   BUN 14 04/08/2014   NA 140 04/08/2014   K 4.4 04/08/2014   CL 105 04/08/2014   CO2 28 04/08/2014    Lab Results  Component Value Date   ALT 21 04/08/2014   AST 23 04/08/2014   ALKPHOS 57 04/08/2014   BILITOT 0.9 04/08/2014    Lab Results  Component Value Date   CHOL 208* 04/08/2014   HDL 69 04/08/2014   LDLCALC 126* 04/08/2014   TRIG 67 04/08/2014   CHOLHDL 3.0 04/08/2014    Lab Results HIV 1 RNA QUANT (copies/mL)  Date Value  04/08/2014 <20  10/09/2013 <20  07/05/2013 20   CD4 T CELL ABS (/uL)  Date Value  04/08/2014 140*  10/09/2013 130*  07/05/2013 120*     Assessment: His HIV infection is under excellent control and he is having slow CD4 reconstitution. I will see if his insurance will approve Prezcobix which will allow him to simplify his regimen.  He has his annual follow-up visit with his general surgeon, Dr. Johney Maine, coming up for follow-up of his anal cancer.  His weight and LDL cholesterol are above goal. I talked him about the importance of getting regular exercise and dietary modification, especially with his family history of premature heart disease and obesity.  Plan: 1. Continue Truvada and Tivicay 2. Change Prezista and Norvir to Prezcobix 3. Continue dapsone 4. Lifestyle modification counseling provided 5. Follow-up after lab work in [place] months   Michel Bickers, MD [facility] for [place] [phone] pager   [phone] cell 04/23/2014, 11:18 AM

## 2014-09-11 ENCOUNTER — Other Ambulatory Visit: Payer: Self-pay | Admitting: Licensed Clinical Social Worker

## 2014-09-11 DIAGNOSIS — B20 Human immunodeficiency virus [HIV] disease: Secondary | ICD-10-CM

## 2014-09-11 MED ORDER — EMTRICITABINE-TENOFOVIR DF 200-300 MG PO TABS
ORAL_TABLET | ORAL | Status: DC
Start: 2014-09-11 — End: 2014-09-11

## 2014-09-11 MED ORDER — EMTRICITABINE-TENOFOVIR DF 200-300 MG PO TABS: ORAL_TABLET | ORAL | Status: DC

## 2014-09-12 ENCOUNTER — Other Ambulatory Visit: Payer: Self-pay | Admitting: *Deleted

## 2014-09-12 DIAGNOSIS — B20 Human immunodeficiency virus [HIV] disease: Secondary | ICD-10-CM

## 2014-09-12 MED ORDER — DARUNAVIR-COBICISTAT 800-150 MG PO TABS: 1.0000 | ORAL_TABLET | Freq: Every day | ORAL | Status: DC

## 2014-09-12 MED ORDER — DOLUTEGRAVIR SODIUM 50 MG PO TABS: 50.0000 mg | ORAL_TABLET | Freq: Every day | ORAL | Status: DC

## 2014-11-25 ENCOUNTER — Other Ambulatory Visit: Payer: Self-pay | Admitting: *Deleted

## 2014-11-25 DIAGNOSIS — B20 Human immunodeficiency virus [HIV] disease: Secondary | ICD-10-CM

## 2014-11-25 MED ORDER — DOLUTEGRAVIR SODIUM 50 MG PO TABS: 50.0000 mg | ORAL_TABLET | Freq: Every day | ORAL | Status: DC

## 2014-11-25 MED ORDER — DARUNAVIR-COBICISTAT 800-150 MG PO TABS: 1.0000 | ORAL_TABLET | Freq: Every day | ORAL | Status: DC

## 2014-11-25 MED ORDER — EMTRICITABINE-TENOFOVIR DF 200-300 MG PO TABS: ORAL_TABLET | ORAL | Status: DC

## 2015-03-05 ENCOUNTER — Encounter: Payer: Self-pay | Admitting: *Deleted

## 2015-03-05 ENCOUNTER — Other Ambulatory Visit: Payer: Self-pay | Admitting: *Deleted

## 2015-03-05 DIAGNOSIS — B20 Human immunodeficiency virus [HIV] disease: Secondary | ICD-10-CM

## 2015-03-05 MED ORDER — DAPSONE 100 MG PO TABS: 100.0000 mg | ORAL_TABLET | Freq: Every morning | ORAL | Status: DC

## 2015-03-05 MED ORDER — EMTRICITABINE-TENOFOVIR DF 200-300 MG PO TABS: ORAL_TABLET | ORAL | Status: DC

## 2015-03-05 MED ORDER — DARUNAVIR-COBICISTAT 800-150 MG PO TABS: 1.0000 | ORAL_TABLET | Freq: Every day | ORAL | Status: DC

## 2015-03-05 MED ORDER — DOLUTEGRAVIR SODIUM 50 MG PO TABS: 50.0000 mg | ORAL_TABLET | Freq: Every day | ORAL | Status: DC

## 2015-04-02 ENCOUNTER — Other Ambulatory Visit: Payer: 59

## 2015-04-02 DIAGNOSIS — Z113 Encounter for screening for infections with a predominantly sexual mode of transmission: Secondary | ICD-10-CM

## 2015-04-02 DIAGNOSIS — B20 Human immunodeficiency virus [HIV] disease: Secondary | ICD-10-CM

## 2015-04-02 LAB — CBC
HEMATOCRIT: 35 % — AB (ref 39.0–52.0)
Hemoglobin: 11.7 g/dL — ABNORMAL LOW (ref 13.0–17.0)
MCH: 34.4 pg — AB (ref 26.0–34.0)
MCHC: 33.4 g/dL (ref 30.0–36.0)
MCV: 102.9 fL — AB (ref 78.0–100.0)
MPV: 8.8 fL (ref 8.6–12.4)
PLATELETS: 204 10*3/uL (ref 150–400)
RBC: 3.4 MIL/uL — AB (ref 4.22–5.81)
RDW: 13.9 % (ref 11.5–15.5)
WBC: 3.3 10*3/uL — ABNORMAL LOW (ref 4.0–10.5)

## 2015-04-02 LAB — COMPREHENSIVE METABOLIC PANEL
ALBUMIN: 4.3 g/dL (ref 3.6–5.1)
ALK PHOS: 58 U/L (ref 40–115)
ALT: 18 U/L (ref 9–46)
AST: 22 U/L (ref 10–35)
BUN: 22 mg/dL (ref 7–25)
CALCIUM: 8.5 mg/dL — AB (ref 8.6–10.3)
CHLORIDE: 104 mmol/L (ref 98–110)
CO2: 25 mmol/L (ref 20–31)
Creat: 1.13 mg/dL (ref 0.70–1.33)
Glucose, Bld: 103 mg/dL — ABNORMAL HIGH (ref 65–99)
POTASSIUM: 4.7 mmol/L (ref 3.5–5.3)
Sodium: 135 mmol/L (ref 135–146)
TOTAL PROTEIN: 6.7 g/dL (ref 6.1–8.1)
Total Bilirubin: 0.9 mg/dL (ref 0.2–1.2)

## 2015-04-02 LAB — LIPID PANEL
CHOL/HDL RATIO: 3.2 ratio (ref <=5.0)
CHOLESTEROL: 182 mg/dL (ref 125–200)
HDL: 57 mg/dL (ref >=40)
LDL Cholesterol: 107 mg/dL (ref <130)
TRIGLYCERIDES: 89 mg/dL (ref <150)
VLDL: 18 mg/dL (ref <30)

## 2015-04-03 LAB — RPR

## 2015-04-03 LAB — T-HELPER CELL (CD4) - (RCID CLINIC ONLY)
CD4 T CELL HELPER: 11 % — AB (ref 33–55)
CD4 T Cell Abs: 160 /uL — ABNORMAL LOW (ref 400–2700)

## 2015-04-03 LAB — HIV-1 RNA QUANT-NO REFLEX-BLD

## 2015-04-09 ENCOUNTER — Other Ambulatory Visit: Payer: 59

## 2015-04-22 ENCOUNTER — Encounter: Payer: Self-pay | Admitting: Internal Medicine

## 2015-04-22 ENCOUNTER — Ambulatory Visit (INDEPENDENT_AMBULATORY_CARE_PROVIDER_SITE_OTHER): Payer: 59 | Admitting: Internal Medicine

## 2015-04-22 DIAGNOSIS — B20 Human immunodeficiency virus [HIV] disease: Secondary | ICD-10-CM

## 2015-04-22 MED ORDER — DARUNAVIR ETHANOLATE 800 MG PO TABS: 800.0000 mg | ORAL_TABLET | Freq: Every day | ORAL | Status: DC

## 2015-04-22 MED ORDER — ELVITEG-COBIC-EMTRICIT-TENOFAF 150-150-200-10 MG PO TABS: 1.0000 | ORAL_TABLET | Freq: Every day | ORAL | Status: DC

## 2015-04-22 NOTE — Assessment & Plan Note (Signed)
His infection has come under better control in the past 2 years. He has had consistent viral suppression and is having very slow CD4 reconstitution. I will simplify and improve his antiretroviral regimen by changing to [facility] and [place]. 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 He will follow-up after lab work in 6 months.

## 2015-04-22 NOTE — Progress Notes (Signed)
Patient Active Problem List   Diagnosis Date Noted  . 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 Human immunodeficiency virus (HIV) disease ([place]) 05/30/2007    Priority: High  . Hyperglycemia 04/17/2013  . Chronic diarrhea 04/17/2013  . 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 Former smoker 11/27/2009  . Chronic cholecystitis with calculus 10/22/2008  . DEGENERATIVE DISC DISEASE 12/26/2007  . BACK PAIN, LUMBAR, WITH RADICULOPATHY 10/05/2007    Patient's Medications  New Prescriptions   DARUNAVIR ETHANOLATE (PREZISTA) 800 MG TABLET    Take 1 tablet (800 mg total) by mouth daily with breakfast.   ELVITEGRAVIR-COBICISTAT-EMTRICITABINE-TENOFOVIR (GENVOYA) 150-150-200-10 MG TABS TABLET    Take 1 tablet by mouth daily with breakfast.  Previous Medications   DAPSONE 100 MG TABLET    Take 1 tablet (100 mg total) by mouth every morning.  Modified Medications   No medications on file  Discontinued Medications   DARUNAVIR-COBICISTAT (PREZCOBIX) 800-150 MG TABLET    Take 1 tablet by mouth daily. Swallow whole. Do NOT crush, break or chew tablets. Take with food.   DOLUTEGRAVIR (TIVICAY) 50 MG TABLET    Take 1 tablet (50 mg total) by mouth daily.   EMTRICITABINE-TENOFOVIR (TRUVADA) 200-300 MG TABLET    TAKE 1 TABLET BY MOUTH ONCE DAILY    Subjective: Matthew Cortez is in for his first visit in one year. He states that he is doing well. He continues to take his Truvada, TIVICAY and Prezcobix without difficulty. He sometimes takes it in the morning but if he has any nausea he will take it at lunch. He has not missed any doses that he is aware of. He also continues to take dapsone. 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 He denies any current health problems. He has been keeping regular, annual visits with Dr. Johney Maine to  follow-up for his rectal cancer.   Review of Systems: Review of Systems  Constitutional: Negative for fever, chills, weight loss, malaise/fatigue and diaphoresis.  HENT: Negative for sore throat.   Respiratory: Negative for cough, sputum production and shortness of breath.   Cardiovascular: Negative for chest pain.  Gastrointestinal: Negative for nausea, vomiting, abdominal pain, diarrhea and blood in stool.  Genitourinary: Negative for dysuria.  Musculoskeletal: Negative for myalgias and joint pain.  Skin: Negative for rash.  Neurological: Negative for dizziness and headaches.  Psychiatric/Behavioral: Negative for depression and substance abuse. The patient is not nervous/anxious.     Past Medical History  Diagnosis Date  . HIV infection ([place])   . DDD (degenerative disc disease)   . Cholelithiasis   . Allergy   . History of radiation therapy 03/29/11- 05/07/11    EOT4/06/2011 anan ca, 52.2Gy  . DDD (degenerative disc disease)   . Squamous cell carcinoma of anus ([place]) 03/08/2011  . Cancer ([place]) 02/11/2011    squamous cell carcinoma of anus  . Anal condyloma   . Pneumonia 15 yrs ago    Social History  Substance Use Topics  . Smoking status: Former Smoker -- 0.25 packs/day for 20 years    Types: Cigarettes    Quit date: 03/07/2010  . Smokeless tobacco: Never Used  . Alcohol Use: 6.0 oz/week    12 drink(s) per week     Comment: 3 glasses of wine per week,  none for past 2-3 mos    Family History  Problem Relation Age of Onset  . Cancer Mother     abdominal tumor  . Cancer Maternal Aunt     breast  . Diabetes Brother   . Kidney disease Father 70    kidney disease  . Heart disease Father     Allergies  Allergen Reactions  . Doxycycline Hives    "Burned inside my body, lips split open and bled"  . Sulfonamide Derivatives Hives    "burned inside my body, inside my mouth"  . Amoxicillin     REACTION: rash  . Nevirapine     REACTION: Stevens-Johnson syndrome  .  Penicillins     Objective:  Filed Vitals:   04/22/15 0856  BP: 139/87  Pulse: 56  Temp: 97.7 F (36.5 C)  TempSrc: Oral  Weight: 217 lb 12 oz (98.771 kg)   Body mass index is 27.95 kg/(m^2).  Physical Exam  Constitutional: He is oriented to person, place, and time.  He is smiling and in good spirits.  HENT:  Mouth/Throat: No oropharyngeal exudate.  Eyes: Conjunctivae are normal.  Cardiovascular: Normal rate and regular rhythm.   No murmur heard. Pulmonary/Chest: Breath sounds normal.  Neurological: He is alert and oriented to person, place, and time.  Skin: No rash noted.  Psychiatric: Mood and affect normal.    Lab Results Lab Results  Component Value Date   WBC 3.3* 04/02/2015   HGB 11.7* 04/02/2015   HCT 35.0* 04/02/2015   MCV 102.9* 04/02/2015   PLT 204 04/02/2015    Lab Results  Component Value Date   CREATININE 1.13 04/02/2015   BUN 22 04/02/2015   NA 135 04/02/2015   K 4.7 04/02/2015   CL 104 04/02/2015   CO2 25 04/02/2015    Lab Results  Component Value Date   ALT 18 04/02/2015   AST 22 04/02/2015   ALKPHOS 58 04/02/2015   BILITOT 0.9 04/02/2015    Lab Results  Component Value Date   CHOL 182 04/02/2015   HDL 57 04/02/2015   LDLCALC 107 04/02/2015   TRIG 89 04/02/2015   CHOLHDL 3.2 04/02/2015    Lab Results HIV 1 RNA QUANT (copies/mL)  Date Value  04/02/2015 <20  04/08/2014 <20  10/09/2013 <20   CD4 T CELL ABS (/uL)  Date Value  04/02/2015 160*  04/08/2014 140*  10/09/2013 130*      Problem List Items Addressed This Visit      High   Human immunodeficiency virus (HIV) disease ([place])    His infection has come under better control in the past 2 years. He has had consistent viral suppression and is having very slow CD4 reconstitution. I will simplify and improve his antiretroviral regimen by changing to [facility] and [place]. 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 He  will follow-up after lab work in 6 months.      Relevant Medications   elvitegravir-cobicistat-emtricitabine-tenofovir (GENVOYA) 150-150-200-10 MG TABS tablet   Darunavir Ethanolate (PREZISTA) 800 MG tablet   Other Relevant Orders   T-helper cell (CD4)- ([facility] clinic only)   HIV 1 RNA quant-no reflex-bld   CBC   Comprehensive metabolic panel        Michel Bickers, MD [facility] for [place] [facility] [phone] pager   [phone] cell 04/22/2015, 9:16 AM

## 2016-01-27 ENCOUNTER — Other Ambulatory Visit: Payer: Self-pay | Admitting: Nurse Practitioner

## 2016-04-23 ENCOUNTER — Other Ambulatory Visit: Payer: Self-pay | Admitting: *Deleted

## 2016-04-23 DIAGNOSIS — B20 Human immunodeficiency virus [HIV] disease: Secondary | ICD-10-CM

## 2016-04-23 MED ORDER — DARUNAVIR ETHANOLATE 800 MG PO TABS: 800.0000 mg | ORAL_TABLET | Freq: Every day | ORAL | 0 refills | Status: DC

## 2016-04-23 MED ORDER — ELVITEG-COBIC-EMTRICIT-TENOFAF 150-150-200-10 MG PO TABS: 1.0000 | ORAL_TABLET | Freq: Every day | ORAL | 0 refills | Status: DC

## 2016-04-23 MED ORDER — DAPSONE 100 MG PO TABS: 100.0000 mg | ORAL_TABLET | Freq: Every morning | ORAL | 0 refills | Status: DC

## 2016-07-13 ENCOUNTER — Other Ambulatory Visit: Payer: Self-pay | Admitting: Internal Medicine

## 2016-07-13 DIAGNOSIS — B20 Human immunodeficiency virus [HIV] disease: Secondary | ICD-10-CM

## 2016-07-14 ENCOUNTER — Other Ambulatory Visit: Payer: Self-pay | Admitting: *Deleted

## 2016-07-14 ENCOUNTER — Telehealth: Payer: Self-pay | Admitting: *Deleted

## 2016-07-14 DIAGNOSIS — B20 Human immunodeficiency virus [HIV] disease: Secondary | ICD-10-CM

## 2016-07-14 DIAGNOSIS — Z79899 Other long term (current) drug therapy: Secondary | ICD-10-CM

## 2016-07-14 DIAGNOSIS — Z113 Encounter for screening for infections with a predominantly sexual mode of transmission: Secondary | ICD-10-CM

## 2016-07-14 NOTE — Telephone Encounter (Signed)
RN called patient regarding refills, need for office visit. Last seen 15 months ago.  Patient scheduled for labs, follow up.  He has not run out of medication. Landis Gandy, RN

## 2016-07-21 ENCOUNTER — Other Ambulatory Visit: Payer: 59

## 2016-07-21 DIAGNOSIS — Z79899 Other long term (current) drug therapy: Secondary | ICD-10-CM

## 2016-07-21 DIAGNOSIS — Z113 Encounter for screening for infections with a predominantly sexual mode of transmission: Secondary | ICD-10-CM

## 2016-07-21 DIAGNOSIS — B20 Human immunodeficiency virus [HIV] disease: Secondary | ICD-10-CM

## 2016-07-21 LAB — LIPID PANEL
CHOLESTEROL: 221 mg/dL — AB (ref <200)
HDL: 62 mg/dL (ref >40)
LDL Cholesterol: 124 mg/dL — ABNORMAL HIGH (ref <100)
TRIGLYCERIDES: 174 mg/dL — AB (ref <150)
Total CHOL/HDL Ratio: 3.6 Ratio (ref <5.0)
VLDL: 35 mg/dL — AB (ref <30)

## 2016-07-21 LAB — COMPREHENSIVE METABOLIC PANEL
ALBUMIN: 4.5 g/dL (ref 3.6–5.1)
ALK PHOS: 52 U/L (ref 40–115)
ALT: 18 U/L (ref 9–46)
AST: 20 U/L (ref 10–35)
BILIRUBIN TOTAL: 1 mg/dL (ref 0.2–1.2)
BUN: 21 mg/dL (ref 7–25)
CALCIUM: 8.8 mg/dL (ref 8.6–10.3)
CO2: 25 mmol/L (ref 20–31)
CREATININE: 1.09 mg/dL (ref 0.70–1.33)
Chloride: 104 mmol/L (ref 98–110)
Glucose, Bld: 93 mg/dL (ref 65–99)
Potassium: 4.4 mmol/L (ref 3.5–5.3)
SODIUM: 137 mmol/L (ref 135–146)
TOTAL PROTEIN: 7 g/dL (ref 6.1–8.1)

## 2016-07-21 LAB — CBC WITH DIFFERENTIAL/PLATELET
BASOS PCT: 1 %
Basophils Absolute: 41 cells/uL (ref 0–200)
EOS PCT: 2 %
Eosinophils Absolute: 82 cells/uL (ref 15–500)
HCT: 37.6 % — ABNORMAL LOW (ref 38.5–50.0)
Hemoglobin: 12.6 g/dL — ABNORMAL LOW (ref 13.2–17.1)
LYMPHS PCT: 36 %
Lymphs Abs: 1476 cells/uL (ref 850–3900)
MCH: 35.8 pg — ABNORMAL HIGH (ref 27.0–33.0)
MCHC: 33.5 g/dL (ref 32.0–36.0)
MCV: 106.8 fL — ABNORMAL HIGH (ref 80.0–100.0)
MONOS PCT: 15 %
MPV: 8.8 fL (ref 7.5–12.5)
Monocytes Absolute: 615 cells/uL (ref 200–950)
Neutro Abs: 1886 cells/uL (ref 1500–7800)
Neutrophils Relative %: 46 %
PLATELETS: 204 10*3/uL (ref 140–400)
RBC: 3.52 MIL/uL — ABNORMAL LOW (ref 4.20–5.80)
RDW: 13.2 % (ref 11.0–15.0)
WBC: 4.1 10*3/uL (ref 3.8–10.8)

## 2016-07-22 LAB — T-HELPER CELL (CD4) - (RCID CLINIC ONLY)
CD4 % Helper T Cell: 12 % — ABNORMAL LOW (ref 33–55)
CD4 T Cell Abs: 180 /uL — ABNORMAL LOW (ref 400–2700)

## 2016-07-22 LAB — RPR

## 2016-07-23 LAB — HIV-1 RNA QUANT-NO REFLEX-BLD
HIV 1 RNA QUANT: 32 {copies}/mL — AB
HIV-1 RNA Quant, Log: 1.51 Log copies/mL — ABNORMAL HIGH

## 2016-08-18 ENCOUNTER — Encounter: Payer: Self-pay | Admitting: Internal Medicine

## 2016-08-18 ENCOUNTER — Ambulatory Visit (INDEPENDENT_AMBULATORY_CARE_PROVIDER_SITE_OTHER): Payer: 59 | Admitting: Internal Medicine

## 2016-08-18 DIAGNOSIS — B20 Human immunodeficiency virus [HIV] disease: Secondary | ICD-10-CM | POA: Diagnosis not present

## 2016-08-18 NOTE — Progress Notes (Signed)
Patient Active Problem List   Diagnosis Date Noted  . 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 Human immunodeficiency virus (HIV) disease ([place]) 05/30/2007    Priority: High  . Hyperglycemia 04/17/2013  . Chronic diarrhea 04/17/2013  . 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 Former smoker 11/27/2009  . Chronic cholecystitis with calculus 10/22/2008  . DEGENERATIVE DISC DISEASE 12/26/2007  . BACK PAIN, LUMBAR, WITH RADICULOPATHY 10/05/2007    Patient's Medications  New Prescriptions   No medications on file  Previous Medications   DAPSONE 100 MG TABLET    Take 1 tablet (100 mg total) by mouth every morning.   GENVOYA 150-150-200-10 MG TABS TABLET    TAKE 1 TABLET BY MOUTH DAILY WITH BREAKFAST   PREZISTA 800 MG TABLET    TAKE 1 TABLET BY MOUTH EVERY DAY WITH BREAKFAST  Modified Medications   No medications on file  Discontinued Medications   No medications on file    Subjective: Matthew Cortez is in for his routine HIV follow-up visit. He has had no problems obtaining, taking or tolerating his Genvoya and Prezista. He denies missing any doses since his last visit 1 year ago. He is also taking dapsone. 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 He is getting regular exercise. He and his partner, Matthew Cortez, are building a new house out on [address].  Review of Systems: Review of Systems  Constitutional: Negative for chills, diaphoresis, fever, malaise/fatigue and weight loss.  HENT: Negative for sore throat.   Respiratory: Negative for cough, sputum production and shortness of breath.   Cardiovascular: Negative for chest pain.  Gastrointestinal: Negative for abdominal pain, diarrhea, heartburn, nausea and vomiting.  Genitourinary: Negative for dysuria and frequency.  Musculoskeletal: Negative for joint pain and myalgias.  Skin: Negative for  rash.  Neurological: Negative for dizziness and headaches.  Psychiatric/Behavioral: Negative for depression and substance abuse. The patient is not nervous/anxious.     Past Medical History:  Diagnosis Date  . Allergy   . Anal condyloma   . Cancer ([place]) 02/11/2011   squamous cell carcinoma of anus  . Cholelithiasis   . DDD (degenerative disc disease)   . DDD (degenerative disc disease)   . History of radiation therapy 03/29/11- 05/07/11   EOT4/06/2011 anan ca, 52.2Gy  . HIV infection ([place])   . Pneumonia 15 yrs ago  . 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 Smoking status: Former Smoker    Packs/day: 0.25    Years: 20.00    Types: Cigarettes    Quit date: 03/07/2010  . Smokeless tobacco: Never Used  . Alcohol use 6.0 oz/week    12 drink(s) per week     Comment: 3 glasses of wine per week, none for past 2-3 mos    Family History  Problem Relation Age of Onset  . Cancer Mother        abdominal tumor  . Cancer Maternal Aunt        breast  . Diabetes Brother   . Kidney disease Father 38       kidney disease  . Heart disease Father     Allergies  Allergen Reactions  . Doxycycline Hives    "Burned inside my body, lips split open and bled"  .  Sulfonamide Derivatives Hives    "burned inside my body, inside my mouth"  . Amoxicillin     REACTION: rash  . Nevirapine     REACTION: Stevens-Johnson syndrome  . Penicillins     Objective:  Vitals:   08/18/16 1435  BP: (!) 147/89  Pulse: (!) 54  Temp: 98 F (36.7 C)  TempSrc: Oral  Weight: 213 lb (96.6 kg)   Body mass index is 27.35 kg/m.  Physical Exam  Constitutional: He is oriented to person, place, and time. No distress.  HENT:  Mouth/Throat: No oropharyngeal exudate.  Eyes: Conjunctivae are normal.  Cardiovascular: Normal rate and regular rhythm.   No murmur heard. Pulmonary/Chest: Effort normal and breath sounds normal.  Abdominal: Soft. He exhibits no  mass. There is no tenderness.  Musculoskeletal: Normal range of motion.  Neurological: He is alert and oriented to person, place, and time.  Skin: No rash noted.  Psychiatric: Mood and affect normal.    Lab Results Lab Results  Component Value Date   WBC 4.1 07/21/2016   HGB 12.6 (L) 07/21/2016   HCT 37.6 (L) 07/21/2016   MCV 106.8 (H) 07/21/2016   PLT 204 07/21/2016    Lab Results  Component Value Date   CREATININE 1.09 07/21/2016   BUN 21 07/21/2016   NA 137 07/21/2016   K 4.4 07/21/2016   CL 104 07/21/2016   CO2 25 07/21/2016    Lab Results  Component Value Date   ALT 18 07/21/2016   AST 20 07/21/2016   ALKPHOS 52 07/21/2016   BILITOT 1.0 07/21/2016    Lab Results  Component Value Date   CHOL 221 (H) 07/21/2016   HDL 62 07/21/2016   LDLCALC 124 (H) 07/21/2016   TRIG 174 (H) 07/21/2016   CHOLHDL 3.6 07/21/2016   Lab Results  Component Value Date   LABRPR NON REAC 07/21/2016   HIV 1 RNA Quant (copies/mL)  Date Value  07/21/2016 32 (H)  04/02/2015 <20  04/08/2014 <20   CD4 T Cell Abs (/uL)  Date Value  07/21/2016 180 (L)  04/02/2015 160 (L)  04/08/2014 140 (L)     Problem List Items Addressed This Visit      High   Human immunodeficiency virus (HIV) disease ([place])    His infection is under very good long-term control and he is having slow but steady CD4 reconstitution. He will continue his current medications and follow-up here after lab work in one year. I encouraged him to follow up with his general surgeon, Dr. Johney Maine, for follow-up after treatment for rectal cancer 5 years ago.      Relevant Orders   CBC   T-helper cell (CD4)- ([facility] clinic only)   Lipid panel   Comprehensive metabolic panel   RPR   HIV 1 RNA quant-no reflex-bld        Michel Bickers, MD [facility] for [facility] [place] [phone] pager   [phone] cell 08/18/2016, 3:04 PM

## 2016-08-18 NOTE — Assessment & Plan Note (Signed)
His infection is under very good long-term control and he is having slow but steady CD4 reconstitution. He will continue his current medications and follow-up here after lab work in one year. I encouraged him to follow up with his general surgeon, Dr. Johney Maine, for follow-up after treatment for rectal cancer 5 years ago.

## 2017-02-25 ENCOUNTER — Other Ambulatory Visit: Payer: Self-pay | Admitting: *Deleted

## 2017-02-25 DIAGNOSIS — B20 Human immunodeficiency virus [HIV] disease: Secondary | ICD-10-CM

## 2017-02-25 MED ORDER — ELVITEG-COBIC-EMTRICIT-TENOFAF 150-150-200-10 MG PO TABS: 1.0000 | ORAL_TABLET | Freq: Every day | ORAL | 4 refills | Status: DC

## 2017-02-25 MED ORDER — DARUNAVIR ETHANOLATE 800 MG PO TABS: ORAL_TABLET | ORAL | 4 refills | Status: DC

## 2017-02-28 ENCOUNTER — Other Ambulatory Visit: Payer: Self-pay | Admitting: *Deleted

## 2017-02-28 DIAGNOSIS — B20 Human immunodeficiency virus [HIV] disease: Secondary | ICD-10-CM

## 2017-02-28 MED ORDER — ELVITEG-COBIC-EMTRICIT-TENOFAF 150-150-200-10 MG PO TABS: 1.0000 | ORAL_TABLET | Freq: Every day | ORAL | 4 refills | Status: DC

## 2017-02-28 MED ORDER — DARUNAVIR ETHANOLATE 800 MG PO TABS: ORAL_TABLET | ORAL | 4 refills | Status: DC

## 2017-02-28 NOTE — Progress Notes (Signed)
Patient needed medications sent to [facility] rather than [facility].  RN confirmed with patient, made annual appointment. Landis Gandy, RN

## 2017-08-03 ENCOUNTER — Other Ambulatory Visit: Payer: 59

## 2017-08-03 DIAGNOSIS — B20 Human immunodeficiency virus [HIV] disease: Secondary | ICD-10-CM

## 2017-08-05 LAB — LIPID PANEL
CHOL/HDL RATIO: 3.6 (calc) (ref <5.0)
Cholesterol: 226 mg/dL — ABNORMAL HIGH (ref <200)
HDL: 63 mg/dL (ref >40)
LDL Cholesterol (Calc): 133 mg/dL (calc) — ABNORMAL HIGH
NON-HDL CHOLESTEROL (CALC): 163 mg/dL — AB (ref <130)
Triglycerides: 164 mg/dL — ABNORMAL HIGH (ref <150)

## 2017-08-05 LAB — COMPREHENSIVE METABOLIC PANEL
AG RATIO: 1.6 (calc) (ref 1.0–2.5)
ALT: 19 U/L (ref 9–46)
AST: 20 U/L (ref 10–35)
Albumin: 4.1 g/dL (ref 3.6–5.1)
Alkaline phosphatase (APISO): 54 U/L (ref 40–115)
BUN: 15 mg/dL (ref 7–25)
CO2: 26 mmol/L (ref 20–32)
CREATININE: 1.04 mg/dL (ref 0.70–1.33)
Calcium: 8.6 mg/dL (ref 8.6–10.3)
Chloride: 106 mmol/L (ref 98–110)
GLUCOSE: 103 mg/dL — AB (ref 65–99)
Globulin: 2.5 g/dL (calc) (ref 1.9–3.7)
Potassium: 4.4 mmol/L (ref 3.5–5.3)
SODIUM: 140 mmol/L (ref 135–146)
Total Bilirubin: 0.4 mg/dL (ref 0.2–1.2)
Total Protein: 6.6 g/dL (ref 6.1–8.1)

## 2017-08-05 LAB — CBC
HCT: 37 % — ABNORMAL LOW (ref 38.5–50.0)
Hemoglobin: 12.9 g/dL — ABNORMAL LOW (ref 13.2–17.1)
MCH: 35.2 pg — AB (ref 27.0–33.0)
MCHC: 34.9 g/dL (ref 32.0–36.0)
MCV: 101.1 fL — ABNORMAL HIGH (ref 80.0–100.0)
MPV: 8.7 fL (ref 7.5–12.5)
PLATELETS: 188 10*3/uL (ref 140–400)
RBC: 3.66 10*6/uL — ABNORMAL LOW (ref 4.20–5.80)
RDW: 12.4 % (ref 11.0–15.0)
WBC: 3.9 10*3/uL (ref 3.8–10.8)

## 2017-08-05 LAB — RPR: RPR Ser Ql: NONREACTIVE

## 2017-08-05 LAB — T-HELPER CELL (CD4) - (RCID CLINIC ONLY)
CD4 T CELL HELPER: 10 % — AB (ref 33–55)
CD4 T Cell Abs: 170 /uL — ABNORMAL LOW (ref 400–2700)

## 2017-08-08 LAB — HIV-1 RNA QUANT-NO REFLEX-BLD
HIV 1 RNA QUANT: DETECTED {copies}/mL — AB
HIV-1 RNA QUANT, LOG: DETECTED {Log_copies}/mL — AB

## 2017-08-17 ENCOUNTER — Ambulatory Visit: Payer: 59 | Admitting: Internal Medicine

## 2017-09-05 ENCOUNTER — Ambulatory Visit: Payer: 59 | Admitting: Internal Medicine

## 2017-09-13 ENCOUNTER — Encounter: Payer: Self-pay | Admitting: Internal Medicine

## 2017-09-13 ENCOUNTER — Ambulatory Visit (INDEPENDENT_AMBULATORY_CARE_PROVIDER_SITE_OTHER): Payer: 59 | Admitting: Internal Medicine

## 2017-09-13 DIAGNOSIS — B20 Human immunodeficiency virus [HIV] disease: Secondary | ICD-10-CM

## 2017-09-13 NOTE — Progress Notes (Signed)
Patient Active Problem List   Diagnosis Date Noted  . 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 Human immunodeficiency virus (HIV) disease ([place]) 05/30/2007    Priority: High  . Hyperglycemia 04/17/2013  . Chronic diarrhea 04/17/2013  . 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 Former smoker 11/27/2009  . Chronic cholecystitis with calculus 10/22/2008  . DEGENERATIVE DISC DISEASE 12/26/2007  . BACK PAIN, LUMBAR, WITH RADICULOPATHY 10/05/2007    Patient's Medications  New Prescriptions   No medications on file  Previous Medications   DAPSONE 100 MG TABLET    Take 1 tablet (100 mg total) by mouth every morning.   DARUNAVIR (PREZISTA) 800 MG TABLET    TAKE 1 TABLET BY MOUTH EVERY DAY WITH BREAKFAST   ELVITEGRAVIR-COBICISTAT-EMTRICITABINE-TENOFOVIR (GENVOYA) 150-150-200-10 MG TABS TABLET    Take 1 tablet by mouth daily with breakfast.  Modified Medications   No medications on file  Discontinued Medications   No medications on file    Subjective: Matthew Cortez is in for his routine HIV follow-up visit. He has had no problems obtaining, taking or tolerating his Genvoya and Prezista. He denies missing any doses since his last visit 1 year ago. He is also taking dapsone.  He continues to be bothered by loose stools and he thinks may be due to his medication.  It tends to occur once each day after eating breakfast.  He manages it with Imodium.  He is feeling well otherwise.  Review of Systems: Review of Systems  Constitutional: Negative for chills, diaphoresis, fever, malaise/fatigue and weight loss.  HENT: Negative for sore throat.   Respiratory: Negative for cough, sputum production and shortness of breath.   Cardiovascular: Negative for chest pain.  Gastrointestinal: Negative for abdominal pain, diarrhea, heartburn, nausea and vomiting.  Genitourinary:  Negative for dysuria and frequency.  Musculoskeletal: Negative for joint pain and myalgias.  Skin: Negative for rash.  Neurological: Negative for dizziness and headaches.  Psychiatric/Behavioral: Negative for depression and substance abuse. The patient is not nervous/anxious.     Past Medical History:  Diagnosis Date  . Allergy   . Anal condyloma   . Cancer ([place]) 02/11/2011   squamous cell carcinoma of anus  . Cholelithiasis   . DDD (degenerative disc disease)   . DDD (degenerative disc disease)   . History of radiation therapy 03/29/11- 05/07/11   EOT4/06/2011 anan ca, 52.2Gy  . HIV infection ([place])   . Pneumonia 15 yrs ago  . 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 Smoking status: Former Smoker    Packs/day: 0.25    Years: 20.00    Pack years: 5.00    Types: Cigarettes    Last attempt to quit: 03/07/2010    Years since quitting: 7.5  . Smokeless tobacco: Never Used  Substance Use Topics  . Alcohol use: Yes    Alcohol/week: 12.0 standard drinks    Types: 12 drink(s) per week    Comment: 3 glasses of wine per week, none for past 2-3 mos  . Drug use: No    Comment: quit smoking 03/2010    Family History  Problem Relation Age of Onset  . Cancer Mother        abdominal tumor  . Cancer Maternal Aunt  breast  . Diabetes Brother   . Kidney disease Father 56       kidney disease  . Heart disease Father     Allergies  Allergen Reactions  . Doxycycline Hives    "Burned inside my body, lips split open and bled"  . Sulfonamide Derivatives Hives    "burned inside my body, inside my mouth"  . Amoxicillin     REACTION: rash  . Nevirapine     REACTION: Stevens-Johnson syndrome  . Penicillins     Objective:  Vitals:   09/13/17 1431 09/13/17 1435  BP:  (!) 144/92  Pulse: 62 60  Temp:  97.9 F (36.6 C)  Weight: 216 lb 12.8 oz (98.3 kg)    Body mass index is 27.84 kg/m.  Physical Exam  Constitutional: He is oriented  to person, place, and time. No distress.  HENT:  Mouth/Throat: No oropharyngeal exudate.  Eyes: Conjunctivae are normal.  Cardiovascular: Normal rate and regular rhythm.  No murmur heard. Pulmonary/Chest: Effort normal and breath sounds normal.  Abdominal: Soft. He exhibits no mass. There is no tenderness.  Musculoskeletal: Normal range of motion.  Neurological: He is alert and oriented to person, place, and time.  Skin: No rash noted.  Psychiatric: Mood and affect normal.    Lab Results Lab Results  Component Value Date   WBC 3.9 08/03/2017   HGB 12.9 (L) 08/03/2017   HCT 37.0 (L) 08/03/2017   MCV 101.1 (H) 08/03/2017   PLT 188 08/03/2017    Lab Results  Component Value Date   CREATININE 1.04 08/03/2017   BUN 15 08/03/2017   NA 140 08/03/2017   K 4.4 08/03/2017   CL 106 08/03/2017   CO2 26 08/03/2017    Lab Results  Component Value Date   ALT 19 08/03/2017   AST 20 08/03/2017   ALKPHOS 52 07/21/2016   BILITOT 0.4 08/03/2017    Lab Results  Component Value Date   CHOL 226 (H) 08/03/2017   HDL 63 08/03/2017   LDLCALC 133 (H) 08/03/2017   TRIG 164 (H) 08/03/2017   CHOLHDL 3.6 08/03/2017   Lab Results  Component Value Date   LABRPR NON-REACTIVE 08/03/2017   HIV 1 RNA Quant (copies/mL)  Date Value  08/03/2017 <20 DETECTED (A)  07/21/2016 32 (H)  04/02/2015 <20   CD4 T Cell Abs (/uL)  Date Value  08/03/2017 170 (L)  07/21/2016 180 (L)  04/02/2015 160 (L)     Problem List Items Addressed This Visit      High   Human immunodeficiency virus (HIV) disease ([place])    His infection remains under excellent, long-term control.  He will continue Genvoya and Prezista and follow-up after lab work in 1 year.      Relevant Orders   CBC   T-helper cell (CD4)- ([facility] clinic only)   Lipid panel   Comprehensive metabolic panel   RPR   HIV 1 RNA quant-no reflex-bld        Michel Bickers, MD [facility] for [place] [phone] pager   [phone] cell 09/13/2017, 3:08 PM

## 2017-09-13 NOTE — Assessment & Plan Note (Signed)
His infection remains under excellent, long-term control.  He will continue Genvoya and Prezista and follow-up after lab work in 1 year. 

## 2017-10-24 ENCOUNTER — Ambulatory Visit: Payer: 59 | Admitting: Podiatry

## 2017-11-24 ENCOUNTER — Ambulatory Visit (INDEPENDENT_AMBULATORY_CARE_PROVIDER_SITE_OTHER): Payer: 59 | Admitting: Podiatry

## 2017-11-24 ENCOUNTER — Ambulatory Visit (INDEPENDENT_AMBULATORY_CARE_PROVIDER_SITE_OTHER): Payer: 59

## 2017-11-24 ENCOUNTER — Other Ambulatory Visit: Payer: Self-pay | Admitting: Podiatry

## 2017-11-24 DIAGNOSIS — M79671 Pain in right foot: Secondary | ICD-10-CM | POA: Diagnosis not present

## 2017-11-24 DIAGNOSIS — M722 Plantar fascial fibromatosis: Secondary | ICD-10-CM | POA: Diagnosis not present

## 2017-11-24 DIAGNOSIS — M778 Other enthesopathies, not elsewhere classified: Secondary | ICD-10-CM

## 2017-11-24 DIAGNOSIS — M779 Enthesopathy, unspecified: Principal | ICD-10-CM

## 2017-11-24 MED ORDER — TRIAMCINOLONE ACETONIDE 10 MG/ML IJ SUSP
10.0000 mg | Freq: Once | INTRAMUSCULAR | Status: AC
  Administered 2017-11-24: 10 mg

## 2017-11-24 NOTE — Patient Instructions (Signed)

## 2017-11-27 NOTE — Progress Notes (Signed)
Subjective:   Patient ID: Matthew Cortez, male   DOB: 57 y.o.   MRN: [id number]   HPI 57 year old male presents the office today for concerns of right heel pain which is been ongoing for 3 to 4 months.  He describes a stabbing sensation in his vomitus ~2 months.  He does have a history of plantar fasciitis in the left foot and she had an injection for which is been well.  He was also given a night splint.  He was previously seen by Dr. Gershon Mussel for this.  He denies any numbness or tingling in the pain does not wake him up at night.  He states that he has pain mostly when he first gets out and he has a boot that he purchased which does help at times.  No significant swelling and no other concerns.   Review of Systems  All other systems reviewed and are negative.  Past Medical History:  Diagnosis Date  . Allergy   . Anal condyloma   . Cancer ([place]) 02/11/2011   squamous cell carcinoma of anus  . Cholelithiasis   . DDD (degenerative disc disease)   . DDD (degenerative disc disease)   . History of radiation therapy 03/29/11- 05/07/11   EOT4/06/2011 anan ca, 52.2Gy  . HIV infection ([place])   . Pneumonia 15 yrs ago  . Squamous cell carcinoma of anus ([place]) 03/08/2011    Past Surgical History:  Procedure Laterality Date  . COLONOSCOPY  !/14/2014   [facility]  . NO PAST SURGERIES    . RECTAL BIOPSY  10/22/2011   Procedure: BIOPSY RECTAL;  Surgeon: Adin Hector, MD;  Location: [facility];  Service: General;  Laterality: N/A;     Current Outpatient Medications:  .  dapsone 100 MG tablet, Take 1 tablet (100 mg total) by mouth every morning., Disp: 30 tablet, Rfl: 0 .  darunavir (PREZISTA) 800 MG tablet, TAKE 1 TABLET BY MOUTH EVERY DAY WITH BREAKFAST, Disp: 30 tablet, Rfl: 4 .  elvitegravir-cobicistat-emtricitabine-tenofovir (GENVOYA) 150-150-200-10 MG TABS tablet, Take 1 tablet by mouth daily with breakfast., Disp: 30 tablet, Rfl: 4  Allergies  Allergen Reactions  . Doxycycline Hives    "Burned inside my  body, lips split open and bled"  . Sulfonamide Derivatives Hives    "burned inside my body, inside my mouth"  . Amoxicillin     REACTION: rash  . Nevirapine     REACTION: Stevens-Johnson syndrome  . Penicillins           Objective:  Physical Exam  General: AAO x3, NAD  Dermatological: Skin is warm, dry and supple bilateral. Nails x 10 are well manicured; remaining integument appears unremarkable at this time. There are no open sores, no preulcerative lesions, no rash or signs of infection present.  Vascular: Dorsalis Pedis artery and Posterior Tibial artery pedal pulses are 2/4 bilateral with immedate capillary fill time. Pedal hair growth present. No varicosities and no lower extremity edema present bilateral. There is no pain with calf compression, swelling, warmth, erythema.   Neruologic: Grossly intact via light touch bilateral. Protective threshold with Semmes Wienstein monofilament intact to all pedal sites bilateral. Negative tinel sing.   Musculoskeletal: Tenderness to palpation along the plantar medial tubercle of the calcaneus at the insertion of plantar fascia on the right foot. There is no pain along the course of the plantar fascia within the arch of the foot. Plantar fascia appears to be intact. There is no pain with lateral compression of the calcaneus  or pain with vibratory sensation. There is no pain along the course or insertion of the achilles tendon. No other areas of tenderness to bilateral lower extremities. Muscular strength 5/5 in all groups tested bilateral.  Gait: Unassisted, Nonantalgic.       Assessment:   Heel pain, plantar fasciitis.     Plan:  -Treatment options discussed including all alternatives, risks, and complications -Etiology of symptoms were discussed -X-rays were obtained and reviewed with the patient.  There is no evidence of acute fracture or stress fracture identified today. -Steroid injection performed.  See procedure note  below. -Plantar fascial brace dispensed. -Continue night splint that he has. -Discussed stretching, icing exercises daily. -Discussed shoe modifications and orthotics.  Procedure: Injection Tendon/Ligament Discussed alternatives, risks, complications and verbal consent was obtained.  Location: Right plantar fascia at the glabrous junction; medial approach. Skin Prep: Alcohol. Injectate: 0.5cc 0.5% marcaine plain, 0.5 cc 2% lidocaine plain and, 1 cc kenalog 10. Disposition: Patient tolerated procedure well. Injection site dressed with a band-aid.  Post-injection care was discussed and return precautions discussed.   RTC in 3 weeks or sooner if needed.  Trula Slade DPM

## 2017-12-16 ENCOUNTER — Other Ambulatory Visit: Payer: Self-pay

## 2017-12-16 ENCOUNTER — Other Ambulatory Visit: Payer: Self-pay | Admitting: Internal Medicine

## 2017-12-16 DIAGNOSIS — B20 Human immunodeficiency virus [HIV] disease: Secondary | ICD-10-CM

## 2017-12-16 MED ORDER — ELVITEG-COBIC-EMTRICIT-TENOFAF 150-150-200-10 MG PO TABS: ORAL_TABLET | ORAL | 4 refills | Status: DC

## 2017-12-20 ENCOUNTER — Other Ambulatory Visit: Payer: Self-pay | Admitting: *Deleted

## 2017-12-20 DIAGNOSIS — B20 Human immunodeficiency virus [HIV] disease: Secondary | ICD-10-CM

## 2017-12-20 MED ORDER — DARUNAVIR ETHANOLATE 800 MG PO TABS: ORAL_TABLET | ORAL | 1 refills | Status: DC

## 2017-12-20 MED ORDER — ELVITEG-COBIC-EMTRICIT-TENOFAF 150-150-200-10 MG PO TABS: ORAL_TABLET | ORAL | 1 refills | Status: DC

## 2018-08-07 ENCOUNTER — Other Ambulatory Visit: Payer: Self-pay

## 2018-08-07 DIAGNOSIS — B20 Human immunodeficiency virus [HIV] disease: Secondary | ICD-10-CM

## 2018-08-07 NOTE — Telephone Encounter (Signed)
Patient states he recently got a new job and needed to update his pharmacy to send medication refill. Patient will call pharmacy carrier to verify pharmacy he needs to use in order for [facility] to update information and send Rx.  Eugenia Mcalpine, LPN

## 2018-08-08 ENCOUNTER — Other Ambulatory Visit: Payer: Self-pay | Admitting: *Deleted

## 2018-08-08 DIAGNOSIS — B20 Human immunodeficiency virus [HIV] disease: Secondary | ICD-10-CM

## 2018-08-08 MED ORDER — DARUNAVIR ETHANOLATE 800 MG PO TABS: ORAL_TABLET | ORAL | 1 refills | Status: DC

## 2018-08-08 MED ORDER — GENVOYA 150-150-200-10 MG PO TABS: ORAL_TABLET | ORAL | 1 refills | Status: DC

## 2018-08-15 ENCOUNTER — Telehealth: Payer: Self-pay

## 2018-08-15 NOTE — Telephone Encounter (Signed)
Received fax from [place] [facility]  Informing us about approval for [facility] JH#:[id number] Episode YJ#:[id number]  Valid from 09-Aug-2018 to 09-Aug-2019.     Lenore Cordia, [place]

## 2018-08-30 ENCOUNTER — Other Ambulatory Visit: Payer: Self-pay

## 2018-08-30 ENCOUNTER — Other Ambulatory Visit: Payer: 59

## 2018-08-30 DIAGNOSIS — B20 Human immunodeficiency virus [HIV] disease: Secondary | ICD-10-CM

## 2018-08-31 LAB — T-HELPER CELL (CD4) - (RCID CLINIC ONLY)
CD4 % Helper T Cell: 14 % — ABNORMAL LOW (ref 33–65)
CD4 T Cell Abs: 142 /uL — ABNORMAL LOW (ref 400–1790)

## 2018-09-07 LAB — LIPID PANEL
Cholesterol: 233 mg/dL — ABNORMAL HIGH (ref <200)
HDL: 64 mg/dL (ref >=40)
LDL Cholesterol (Calc): 144 mg/dL (calc) — ABNORMAL HIGH
Non-HDL Cholesterol (Calc): 169 mg/dL (calc) — ABNORMAL HIGH (ref <130)
Total CHOL/HDL Ratio: 3.6 (calc) (ref <5.0)
Triglycerides: 125 mg/dL (ref <150)

## 2018-09-07 LAB — COMPREHENSIVE METABOLIC PANEL
AG Ratio: 2 (calc) (ref 1.0–2.5)
ALT: 18 U/L (ref 9–46)
AST: 22 U/L (ref 10–35)
Albumin: 4.7 g/dL (ref 3.6–5.1)
Alkaline phosphatase (APISO): 47 U/L (ref 35–144)
BUN: 19 mg/dL (ref 7–25)
CO2: 29 mmol/L (ref 20–32)
Calcium: 9.5 mg/dL (ref 8.6–10.3)
Chloride: 103 mmol/L (ref 98–110)
Creat: 0.98 mg/dL (ref 0.70–1.33)
Globulin: 2.4 g/dL (calc) (ref 1.9–3.7)
Glucose, Bld: 104 mg/dL — ABNORMAL HIGH (ref 65–99)
Potassium: 4.5 mmol/L (ref 3.5–5.3)
Sodium: 138 mmol/L (ref 135–146)
Total Bilirubin: 0.7 mg/dL (ref 0.2–1.2)
Total Protein: 7.1 g/dL (ref 6.1–8.1)

## 2018-09-07 LAB — CBC
HCT: 40.7 % (ref 38.5–50.0)
Hemoglobin: 14.5 g/dL (ref 13.2–17.1)
MCH: 36.1 pg — ABNORMAL HIGH (ref 27.0–33.0)
MCHC: 35.6 g/dL (ref 32.0–36.0)
MCV: 101.2 fL — ABNORMAL HIGH (ref 80.0–100.0)
MPV: 9 fL (ref 7.5–12.5)
Platelets: 214 10*3/uL (ref 140–400)
RBC: 4.02 10*6/uL — ABNORMAL LOW (ref 4.20–5.80)
RDW: 11.9 % (ref 11.0–15.0)
WBC: 4.2 10*3/uL (ref 3.8–10.8)

## 2018-09-07 LAB — HIV-1 RNA QUANT-NO REFLEX-BLD
HIV 1 RNA Quant: 75 copies/mL — ABNORMAL HIGH
HIV-1 RNA Quant, Log: 1.88 Log copies/mL — ABNORMAL HIGH

## 2018-09-07 LAB — RPR: RPR Ser Ql: NONREACTIVE

## 2018-09-13 ENCOUNTER — Encounter: Payer: 59 | Admitting: Internal Medicine

## 2019-08-08 ENCOUNTER — Other Ambulatory Visit: Payer: Self-pay | Admitting: Internal Medicine

## 2019-08-08 DIAGNOSIS — B20 Human immunodeficiency virus [HIV] disease: Secondary | ICD-10-CM

## 2019-08-09 ENCOUNTER — Telehealth: Payer: Self-pay

## 2019-08-09 NOTE — Telephone Encounter (Signed)
Called and left patient a voicemail to call back and get appointments scheduled

## 2019-10-26 ENCOUNTER — Other Ambulatory Visit: Payer: Self-pay

## 2019-10-26 DIAGNOSIS — Z113 Encounter for screening for infections with a predominantly sexual mode of transmission: Secondary | ICD-10-CM

## 2019-10-26 DIAGNOSIS — Z79899 Other long term (current) drug therapy: Secondary | ICD-10-CM

## 2019-10-26 DIAGNOSIS — B20 Human immunodeficiency virus [HIV] disease: Secondary | ICD-10-CM

## 2019-10-29 ENCOUNTER — Other Ambulatory Visit (HOSPITAL_COMMUNITY)
Admission: RE | Admit: 2019-10-29 | Discharge: 2019-10-29 | Disposition: A | Discharge status: Home / Self Care | Payer: 59 | Source: Ambulatory Visit | Attending: Internal Medicine | Admitting: Internal Medicine

## 2019-10-29 ENCOUNTER — Other Ambulatory Visit: Payer: Self-pay

## 2019-10-29 ENCOUNTER — Other Ambulatory Visit: Payer: 59

## 2019-10-29 DIAGNOSIS — B20 Human immunodeficiency virus [HIV] disease: Secondary | ICD-10-CM | POA: Diagnosis present

## 2019-10-29 DIAGNOSIS — Z79899 Other long term (current) drug therapy: Secondary | ICD-10-CM

## 2019-10-29 DIAGNOSIS — Z113 Encounter for screening for infections with a predominantly sexual mode of transmission: Secondary | ICD-10-CM | POA: Diagnosis present

## 2019-10-30 LAB — URINE CYTOLOGY ANCILLARY ONLY
Chlamydia: NEGATIVE
Comment: NEGATIVE
Comment: NORMAL
Neisseria Gonorrhea: NEGATIVE

## 2019-10-30 LAB — T-HELPER CELL (CD4) - (RCID CLINIC ONLY)
CD4 % Helper T Cell: 11 % — ABNORMAL LOW (ref 33–65)
CD4 T Cell Abs: 198 /uL — ABNORMAL LOW (ref 400–1790)

## 2019-11-01 LAB — COMPLETE METABOLIC PANEL WITH GFR
AG Ratio: 1.8 (calc) (ref 1.0–2.5)
ALT: 18 U/L (ref 9–46)
AST: 20 U/L (ref 10–35)
Albumin: 4.3 g/dL (ref 3.6–5.1)
Alkaline phosphatase (APISO): 74 U/L (ref 35–144)
BUN: 19 mg/dL (ref 7–25)
CO2: 25 mmol/L (ref 20–32)
Calcium: 9.2 mg/dL (ref 8.6–10.3)
Chloride: 104 mmol/L (ref 98–110)
Creat: 1.02 mg/dL (ref 0.70–1.33)
GFR, Est African American: 93 mL/min/{1.73_m2} (ref >=60)
GFR, Est Non African American: 81 mL/min/{1.73_m2} (ref >=60)
Globulin: 2.4 g/dL (calc) (ref 1.9–3.7)
Glucose, Bld: 103 mg/dL — ABNORMAL HIGH (ref 65–99)
Potassium: 4.6 mmol/L (ref 3.5–5.3)
Sodium: 138 mmol/L (ref 135–146)
Total Bilirubin: 0.5 mg/dL (ref 0.2–1.2)
Total Protein: 6.7 g/dL (ref 6.1–8.1)

## 2019-11-01 LAB — CBC WITH DIFFERENTIAL/PLATELET
Absolute Monocytes: 784 cells/uL (ref 200–950)
Basophils Absolute: 37 cells/uL (ref 0–200)
Basophils Relative: 0.7 %
Eosinophils Absolute: 58 cells/uL (ref 15–500)
Eosinophils Relative: 1.1 %
HCT: 40 % (ref 38.5–50.0)
Hemoglobin: 14 g/dL (ref 13.2–17.1)
Lymphs Abs: 1754 cells/uL (ref 850–3900)
MCH: 36.6 pg — ABNORMAL HIGH (ref 27.0–33.0)
MCHC: 35 g/dL (ref 32.0–36.0)
MCV: 104.7 fL — ABNORMAL HIGH (ref 80.0–100.0)
MPV: 8.7 fL (ref 7.5–12.5)
Monocytes Relative: 14.8 %
Neutro Abs: 2666 cells/uL (ref 1500–7800)
Neutrophils Relative %: 50.3 %
Platelets: 217 10*3/uL (ref 140–400)
RBC: 3.82 10*6/uL — ABNORMAL LOW (ref 4.20–5.80)
RDW: 12 % (ref 11.0–15.0)
Total Lymphocyte: 33.1 %
WBC: 5.3 10*3/uL (ref 3.8–10.8)

## 2019-11-01 LAB — HIV-1 RNA QUANT-NO REFLEX-BLD
HIV 1 RNA Quant: 92 {copies}/mL — ABNORMAL HIGH
HIV-1 RNA Quant, Log: 1.96 {Log_copies}/mL — ABNORMAL HIGH

## 2019-11-01 LAB — LIPID PANEL
Cholesterol: 250 mg/dL — ABNORMAL HIGH (ref <200)
HDL: 80 mg/dL (ref >=40)
LDL Cholesterol (Calc): 152 mg/dL (calc) — ABNORMAL HIGH
Non-HDL Cholesterol (Calc): 170 mg/dL (calc) — ABNORMAL HIGH (ref <130)
Total CHOL/HDL Ratio: 3.1 (calc) (ref <5.0)
Triglycerides: 77 mg/dL (ref <150)

## 2019-11-01 LAB — RPR: RPR Ser Ql: NONREACTIVE

## 2019-11-14 ENCOUNTER — Ambulatory Visit (INDEPENDENT_AMBULATORY_CARE_PROVIDER_SITE_OTHER): Payer: Commercial Managed Care - PPO | Admitting: Internal Medicine

## 2019-11-14 ENCOUNTER — Encounter: Payer: Self-pay | Admitting: Internal Medicine

## 2019-11-14 ENCOUNTER — Other Ambulatory Visit: Payer: Self-pay

## 2019-11-14 VITALS — BP 150/85 | HR 54 | Wt 213.0 lb

## 2019-11-14 DIAGNOSIS — Z23 Encounter for immunization: Secondary | ICD-10-CM | POA: Diagnosis not present

## 2019-11-14 DIAGNOSIS — B20 Human immunodeficiency virus [HIV] disease: Secondary | ICD-10-CM | POA: Diagnosis not present

## 2019-11-14 DIAGNOSIS — C210 Malignant neoplasm of anus, unspecified: Secondary | ICD-10-CM

## 2019-11-14 MED ORDER — GENVOYA 150-150-200-10 MG PO TABS: ORAL_TABLET | ORAL | 1 refills | Status: DC

## 2019-11-14 MED ORDER — DARUNAVIR ETHANOLATE 800 MG PO TABS: ORAL_TABLET | ORAL | 3 refills | Status: DC

## 2019-11-14 MED ORDER — DAPSONE 100 MG PO TABS: 100.0000 mg | ORAL_TABLET | Freq: Every morning | ORAL | 11 refills | Status: DC

## 2019-11-14 NOTE — Progress Notes (Signed)
Patient Active Problem List   Diagnosis Date Noted  . 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 Human immunodeficiency virus (HIV) disease ([place]) 05/30/2007    Priority: High  . Hyperglycemia 04/17/2013  . Chronic diarrhea 04/17/2013  . 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 Former smoker 11/27/2009  . Chronic cholecystitis with calculus 10/22/2008  . DEGENERATIVE DISC DISEASE 12/26/2007  . BACK PAIN, LUMBAR, WITH RADICULOPATHY 10/05/2007    Patient's Medications  New Prescriptions   No medications on file  Previous Medications   No medications on file  Modified Medications   Modified Medication Previous Medication   DAPSONE 100 MG TABLET dapsone 100 MG tablet      Take 1 tablet (100 mg total) by mouth every morning.    Take 1 tablet (100 mg total) by mouth every morning.   DARUNAVIR (PREZISTA) 800 MG TABLET darunavir (PREZISTA) 800 MG tablet      TAKE 1 TABLET BY MOUTH  EVERY DAY WITH BREAKFAST and Genvoya    TAKE 1 TABLET BY MOUTH  EVERY DAY WITH BREAKFAST and Genvoya   ELVITEGRAVIR-COBICISTAT-EMTRICITABINE-TENOFOVIR (GENVOYA) 150-150-200-10 MG TABS TABLET elvitegravir-cobicistat-emtricitabine-tenofovir (GENVOYA) 150-150-200-10 MG TABS tablet      TAKE 1 TABLET BY MOUTH  EVERY DAY WITH BREAKFAST and Prezista    TAKE 1 TABLET BY MOUTH  EVERY DAY WITH BREAKFAST and Prezista  Discontinued Medications   No medications on file    Subjective: Madhav is in for his first HIV follow-up visit in 2 years. He says that he did not realize it had been so long. Fortunately he has not had any problems obtaining, taking or tolerating his Genvoya, Prezista or dapsone. He does not recall missing doses. 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 He has not been getting much exercise  recently. He got a letter recently stating that he needed a follow-up colonoscopy but he has not set up an appointment yet. He is feeling well. He has received his first 2 doses of the Moderna Covid vaccine.  Review of Systems: Review of Systems  Constitutional: Negative for chills, fever, malaise/fatigue and weight loss.  Respiratory: Negative for cough and shortness of breath.   Cardiovascular: Negative for chest pain.  Gastrointestinal: Negative for abdominal pain, nausea and vomiting.  Psychiatric/Behavioral: Negative for depression. The patient is not nervous/anxious.     Past Medical History:  Diagnosis Date  . Allergy   . Anal condyloma   . Cancer ([place]) 02/11/2011   squamous cell carcinoma of anus  . Cholelithiasis   . DDD (degenerative disc disease)   . DDD (degenerative disc disease)   . History of radiation therapy 03/29/11- 05/07/11   EOT4/06/2011 anan ca, 52.2Gy  . HIV infection ([place])   . Pneumonia 15 yrs ago  . 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 Smoking status: Former Smoker    Packs/day: 0.25    Years: 20.00    Pack years: 5.00    Types: Cigarettes    Quit date: 03/07/2010    Years since quitting: 9.6  . Smokeless tobacco: Never Used  Substance Use Topics  . Alcohol use: Yes    Alcohol/week: 12.0 standard drinks    Types:  12 drink(s) per week    Comment: 3 glasses of wine per week, none for past 2-3 mos  . Drug use: No    Comment: quit smoking 03/2010    Family History  Problem Relation Age of Onset  . Cancer Mother        abdominal tumor  . Cancer Maternal Aunt        breast  . Diabetes Brother   . Kidney disease Father 7       kidney disease  . Heart disease Father     Allergies  Allergen Reactions  . Doxycycline Hives    "Burned inside my body, lips split open and bled"  . Sulfonamide Derivatives Hives    "burned inside my body, inside my mouth"  . Amoxicillin     REACTION: rash  . Nevirapine       REACTION: Stevens-Johnson syndrome  . Penicillins     Health Maintenance  Topic Date Due  . COVID-19 Vaccine (1) Never done  . TETANUS/TDAP  Never done  . INFLUENZA VACCINE  09/02/2019  . COLONOSCOPY  02/10/2021  . Hepatitis C Screening  Completed  . HIV Screening  Completed    Objective:  Vitals:   11/14/19 0928  BP: (!) 150/85  Pulse: (!) 54  SpO2: 95%  Weight: 213 lb (96.6 kg)   Body mass index is 27.35 kg/m.  Physical Exam Constitutional:      Comments: He is in good spirits.  Cardiovascular:     Rate and Rhythm: Normal rate and regular rhythm.     Heart sounds: No murmur heard.   Pulmonary:     Effort: Pulmonary effort is normal.     Breath sounds: Normal breath sounds.  Abdominal:     Palpations: Abdomen is soft.     Tenderness: There is no abdominal tenderness.  Psychiatric:        Mood and Affect: Mood normal.     Lab Results Lab Results  Component Value Date   WBC 5.3 10/29/2019   HGB 14.0 10/29/2019   HCT 40.0 10/29/2019   MCV 104.7 (H) 10/29/2019   PLT 217 10/29/2019    Lab Results  Component Value Date   CREATININE 1.02 10/29/2019   BUN 19 10/29/2019   NA 138 10/29/2019   K 4.6 10/29/2019   CL 104 10/29/2019   CO2 25 10/29/2019    Lab Results  Component Value Date   ALT 18 10/29/2019   AST 20 10/29/2019   ALKPHOS 52 07/21/2016   BILITOT 0.5 10/29/2019    Lab Results  Component Value Date   CHOL 250 (H) 10/29/2019   HDL 80 10/29/2019   LDLCALC 152 (H) 10/29/2019   TRIG 77 10/29/2019   CHOLHDL 3.1 10/29/2019   Lab Results  Component Value Date   LABRPR NON-REACTIVE 10/29/2019   HIV 1 RNA Quant  Date Value  10/29/2019 92 Copies/mL (H)  08/30/2018 75 copies/mL (H)  08/03/2017 <20 DETECTED copies/mL (A)   CD4 T Cell Abs (/uL)  Date Value  10/29/2019 198 (L)  08/30/2018 142 (L)  08/03/2017 170 (L)     Problem List Items Addressed This Visit      High   Squamous cell carcinoma of anus ([place])    I encouraged him  to schedule his follow-up colonoscopy as soon as possible.      Relevant Medications   dapsone 100 MG tablet   darunavir (PREZISTA) 800 MG tablet   elvitegravir-cobicistat-emtricitabine-tenofovir (GENVOYA) 150-150-200-10 MG TABS tablet  Human immunodeficiency virus (HIV) disease ([place])    His infection is under good long-term control. He has had some CD4 reconstitution but will need to continue dapsone prophylaxis. He will continue his current regimen and follow-up after lab work in 1 year. He received his influenza vaccine today and will get his Moderna booster vaccine soon.      Relevant Medications   dapsone 100 MG tablet   darunavir (PREZISTA) 800 MG tablet   elvitegravir-cobicistat-emtricitabine-tenofovir (GENVOYA) 150-150-200-10 MG TABS tablet   Other Relevant Orders   CBC   T-helper cell (CD4)- ([facility] clinic only)   Comprehensive metabolic panel   Lipid panel   RPR   HIV-1 RNA quant-no reflex-bld    Other Visit Diagnoses    Acquired immune deficiency syndrome ([place])       Relevant Medications   dapsone 100 MG tablet   darunavir (PREZISTA) 800 MG tablet   elvitegravir-cobicistat-emtricitabine-tenofovir (GENVOYA) 150-150-200-10 MG TABS tablet        Michel Bickers, MD [facility] for [place] [phone] pager   [phone] cell 11/14/2019, 9:56 AM

## 2019-11-14 NOTE — Assessment & Plan Note (Signed)
I encouraged him to schedule his follow-up colonoscopy as soon as possible.

## 2019-11-14 NOTE — Assessment & Plan Note (Signed)
His infection is under good long-term control. He has had some CD4 reconstitution but will need to continue dapsone prophylaxis. He will continue his current regimen and follow-up after lab work in 1 year. He received his influenza vaccine today and will get his Moderna booster vaccine soon.

## 2020-03-04 ENCOUNTER — Other Ambulatory Visit: Payer: Self-pay

## 2020-03-04 DIAGNOSIS — B20 Human immunodeficiency virus [HIV] disease: Secondary | ICD-10-CM

## 2020-03-04 MED ORDER — DAPSONE 100 MG PO TABS: 100.0000 mg | ORAL_TABLET | Freq: Every morning | ORAL | 1 refills | Status: DC

## 2020-03-04 MED ORDER — DARUNAVIR ETHANOLATE 800 MG PO TABS: ORAL_TABLET | ORAL | 1 refills | Status: DC

## 2020-03-04 MED ORDER — GENVOYA 150-150-200-10 MG PO TABS: ORAL_TABLET | ORAL | 1 refills | Status: DC

## 2020-03-19 ENCOUNTER — Telehealth: Payer: Self-pay

## 2020-03-19 NOTE — Telephone Encounter (Signed)
Yes, it is okay for Matthew Cortez to continue taking dapsone.  He has tolerated it well.

## 2020-03-19 NOTE — Telephone Encounter (Signed)
Pharmacist [place] with [facility] has been advised it is ok for patient to continue taking the dapsone and he has tolerated it well in the past. Niomi Valent T Brooks Sailors

## 2020-03-19 NOTE — Telephone Encounter (Signed)
Patient's pharmacy [facility] is calling in regards to the patient taking dapsone with him having an allergy to sulfa. Is it ok for the patient to continue taking the dapsone? Please advise

## 2020-08-12 ENCOUNTER — Telehealth: Payer: Self-pay | Admitting: Internal Medicine

## 2020-08-12 NOTE — Telephone Encounter (Signed)
1. PDF Attachment    Continuity of Care Document 08/10/2020 [facility] Consultation Note      Matthew Cortez - 60 y.o. Male; born [birth year][facility] Consultation Note, generated on Jul. 10, 2022July 10, 2022  NOTES   August 10, 2020    Re: Meds on Track Program     Dear Meriel Flavors MD    [facility] administers the Meds on Track Program on behalf of your patient(s) prescription drug coverage. The goal of this program is to collaborate with you to improve your patient(s) adherence to chronic medications. The enclosed report identifies your patient(s) with potential nonadherence that require your attention. Please review the information and consider options that can improve your patients' medication adherence, where appropriate.     This report does not take into account patient-specific variables that may factor into your prescribing decisions.      If you have already identified the concern, please disregard this notice and continue to monitor your patient for potential issues.      If you did not prescribe the identified medication(s), or if the patient is not under your care, please contact the dispensing pharmacy    If you have questions about this report, please contact [facility] at [phone]. We appreciate your continued support.      Sincerely,     The [facility] Clinical Team         Case Number: [id number]    Potential Clinical Concern: Medication Non-Adherence for ANTIRETROVIRALS      Based on your patient's prescription refill history, we are concerned that they may be non-compliant to the prescribed dosing regimen and are under-utilizing this medication. Please consider discussing the importance of medication compliance with your patient. This can help to identify and resolve potential barriers to medication compliance (e.g., side effects, lack of perceived value, forgetfulness) and lead to improved therapeutic outcomes. Please review the report below that  details your patient's medication adherence and prescription refill history during the last 6 months for the identified medication class.     The following table shows the pharmacy claims related to the potential clinical concern identified above:     NOTES Drug Name [place] Date Qty  Days Supply Prescriber Name Prescriber Phone # Pharmacy Name Pharmacy Phone #  GENVOYA TAB 08/08/2020 [place] MD [place] PRIME [phone] [place] TAB 800MG  08/08/2020 [place] Mady Haagensen PRIME [phone]  [place] TAB 04/07/2020 [place] Mady Haagensen PRIME [phone]  PREZISTA TAB 800MG  04/04/2020 [place] Mady Haagensen PRIME #[id number] [phone]      The aggregate data used for this report may have limitations that could cause patients or data to be mistakenly identified [e.g., patient is deceased, no longer eligible for pharmacy benefits, already taking the recommended therapy, or not appropriate candidate for the recommended therapy, etc.]. If your patient or the data has been mistakenly identified, please disregard this notice. You do not need to respond to this report. If you did not prescribe the identified medication(s), or if the patient is not under your care, please contact the dispensing pharmacy.    This document and others if attached contain information from [facility] that is proprietary, confidential and/or may contain protected health information (PHI). We are required to safeguard PHI by applicable law. The information in this document is for the sole use of the  person(s) or company named above. If you received this document by mistake, please know that sharing, copying, distributing or using information in this document is against the law. If you are not the intended recipient, please notify the sender immediately  and return the document(s) by mail to [facility] Clinical Dept[facility] [address], [place] [address].    Data for patient identification is obtained from the member's most currently available pharmacy claims, as provided by the plan's pharmacy benefit manager, [facility], a [employer].    [facility] clinical notifications are sent to you for the safety of your patients. If you want to opt out of these notifications, email [email or web address]. Please note: By opting out, you risk missing important medication alerts that could cause disruption to your patients' therapy.

## 2020-10-27 ENCOUNTER — Other Ambulatory Visit: Payer: Self-pay

## 2020-10-27 ENCOUNTER — Other Ambulatory Visit: Payer: Commercial Managed Care - PPO

## 2020-10-27 ENCOUNTER — Other Ambulatory Visit: Payer: Self-pay | Admitting: Internal Medicine

## 2020-10-27 DIAGNOSIS — B20 Human immunodeficiency virus [HIV] disease: Secondary | ICD-10-CM

## 2020-10-28 LAB — T-HELPER CELL (CD4) - (RCID CLINIC ONLY)
CD4 % Helper T Cell: 9 % — ABNORMAL LOW (ref 33–65)
CD4 T Cell Abs: 171 /uL — ABNORMAL LOW (ref 400–1790)

## 2020-10-29 LAB — COMPREHENSIVE METABOLIC PANEL
AG Ratio: 1.7 (calc) (ref 1.0–2.5)
ALT: 22 U/L (ref 9–46)
AST: 25 U/L (ref 10–35)
Albumin: 4.5 g/dL (ref 3.6–5.1)
Alkaline phosphatase (APISO): 54 U/L (ref 35–144)
BUN: 14 mg/dL (ref 7–25)
CO2: 27 mmol/L (ref 20–32)
Calcium: 9.1 mg/dL (ref 8.6–10.3)
Chloride: 105 mmol/L (ref 98–110)
Creat: 1 mg/dL (ref 0.70–1.30)
Globulin: 2.6 g/dL (calc) (ref 1.9–3.7)
Glucose, Bld: 91 mg/dL (ref 65–99)
Potassium: 4.2 mmol/L (ref 3.5–5.3)
Sodium: 139 mmol/L (ref 135–146)
Total Bilirubin: 0.6 mg/dL (ref 0.2–1.2)
Total Protein: 7.1 g/dL (ref 6.1–8.1)

## 2020-10-29 LAB — CBC
HCT: 38.4 % — ABNORMAL LOW (ref 38.5–50.0)
Hemoglobin: 13.2 g/dL (ref 13.2–17.1)
MCH: 37.7 pg — ABNORMAL HIGH (ref 27.0–33.0)
MCHC: 34.4 g/dL (ref 32.0–36.0)
MCV: 109.7 fL — ABNORMAL HIGH (ref 80.0–100.0)
MPV: 9.1 fL (ref 7.5–12.5)
Platelets: 187 10*3/uL (ref 140–400)
RBC: 3.5 10*6/uL — ABNORMAL LOW (ref 4.20–5.80)
RDW: 11.4 % (ref 11.0–15.0)
WBC: 3.9 10*3/uL (ref 3.8–10.8)

## 2020-10-29 LAB — LIPID PANEL
Cholesterol: 230 mg/dL — ABNORMAL HIGH (ref <200)
HDL: 76 mg/dL (ref >=40)
LDL Cholesterol (Calc): 125 mg/dL (calc) — ABNORMAL HIGH
Non-HDL Cholesterol (Calc): 154 mg/dL (calc) — ABNORMAL HIGH (ref <130)
Total CHOL/HDL Ratio: 3 (calc) (ref <5.0)
Triglycerides: 176 mg/dL — ABNORMAL HIGH (ref <150)

## 2020-10-29 LAB — RPR: RPR Ser Ql: NONREACTIVE

## 2020-10-29 LAB — HIV-1 RNA QUANT-NO REFLEX-BLD
HIV 1 RNA Quant: 61 Copies/mL — ABNORMAL HIGH
HIV-1 RNA Quant, Log: 1.79 Log cps/mL — ABNORMAL HIGH

## 2020-11-12 ENCOUNTER — Other Ambulatory Visit: Payer: Self-pay

## 2020-11-12 ENCOUNTER — Encounter: Payer: Self-pay | Admitting: Internal Medicine

## 2020-11-12 ENCOUNTER — Ambulatory Visit: Payer: 59 | Admitting: Internal Medicine

## 2020-11-12 DIAGNOSIS — Z8739 Personal history of other diseases of the musculoskeletal system and connective tissue: Secondary | ICD-10-CM

## 2020-11-12 DIAGNOSIS — E785 Hyperlipidemia, unspecified: Secondary | ICD-10-CM | POA: Diagnosis not present

## 2020-11-12 DIAGNOSIS — B20 Human immunodeficiency virus [HIV] disease: Secondary | ICD-10-CM

## 2020-11-12 MED ORDER — GENVOYA 150-150-200-10 MG PO TABS: ORAL_TABLET | ORAL | 11 refills | Status: DC

## 2020-11-12 MED ORDER — DARUNAVIR 800 MG PO TABS: 800.0000 mg | ORAL_TABLET | Freq: Every day | ORAL | 11 refills | Status: DC

## 2020-11-12 MED ORDER — DAPSONE 100 MG PO TABS: 100.0000 mg | ORAL_TABLET | Freq: Every morning | ORAL | 11 refills | Status: DC

## 2020-11-12 NOTE — Assessment & Plan Note (Signed)
His infection has been under good, long-term control over the past decade since his adherence has improved.  His CD4 count remains less than 200 but his risk for opportunistic infections is very low.  He will continue his current antiretroviral regimen and dapsone and follow-up after lab work in 1 year.

## 2020-11-12 NOTE — Progress Notes (Signed)
Patient Active Problem List   Diagnosis Date Noted   Squamous cell carcinoma of anus ([place]) 03/08/2011    Priority: 1.   Human immunodeficiency virus (HIV) disease ([place]) 05/30/2007    Priority: 1.   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  Previous Medications   DARUNAVIR (PREZISTA) 800 MG TABLET    TAKE 1 TABLET BY MOUTH  EVERY DAY WITH BREAKFAST and Genvoya   PREZISTA 800 MG TABLET    TAKE 1 TABLET BY MOUTH DAILY WITH BREAKFAST AND GENVOYA  Modified Medications   Modified Medication Previous Medication   DAPSONE 100 MG TABLET dapsone 100 MG tablet      Take 1 tablet (100 mg total) by mouth every morning.    TAKE 1 TABLET BY MOUTH IN THE MORNING   ELVITEGRAVIR-COBICISTAT-EMTRICITABINE-TENOFOVIR (GENVOYA) 150-150-200-10 MG TABS TABLET elvitegravir-cobicistat-emtricitabine-tenofovir (GENVOYA) 150-150-200-10 MG TABS tablet      TAKE 1 TABLET BY MOUTH DAILY WITH BREAKFAST AND PREZISTA    TAKE 1 TABLET BY MOUTH DAILY WITH BREAKFAST AND PREZISTA  Discontinued Medications   No medications on file    Subjective: Matthew Cortez is in for his routine HIV follow-up visit.  He denies any problems obtaining, taking or tolerating his Epzicom, Prezista or Dapsone.  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  He denies missing any doses.  He is feeling well.  He denies feeling anxious or depressed.  He got a flu vaccine recently and he plans on getting the new COVID booster vaccine soon.   He is looking forward to a trip to [place] soon for a family wedding.  He has still not found a PCP.  He has been having some problems with back pain  Review of Systems: Review of Systems  Constitutional:  Negative for fever and weight loss.  Respiratory:  Negative for cough and shortness of breath.   Cardiovascular:  Negative for chest pain.  Musculoskeletal:  Positive for back pain.  Psychiatric/Behavioral:  Negative for depression. The patient is not nervous/anxious.    Past Medical History:  Diagnosis Date   Allergy    Anal condyloma    Cancer ([place]) 02/11/2011   squamous cell carcinoma of anus   Cholelithiasis    DDD (degenerative disc disease)    DDD (degenerative disc disease)    History of radiation therapy 03/29/11- 05/07/11   EOT4/06/2011 anan ca, 52.2Gy   HIV infection ([place])    Pneumonia 15 yrs ago   Squamous cell carcinoma of anus ([place]) 03/08/2011    Social History   Tobacco Use   Smoking status: Former    Packs/day: 0.25    Years: 20.00    Pack years: 5.00    Types: Cigarettes    Quit date: 03/07/2010    Years since quitting: 10.6   Smokeless tobacco: Never  Substance Use Topics   Alcohol use: Yes    Alcohol/week: 12.0 standard drinks    Types: 12 drink(s) per week  Comment: 3 glasses of wine per week, none for past 2-3 mos   Drug use: No    Comment: quit smoking 03/2010    Family History  Problem Relation Age of Onset   Cancer Mother        abdominal tumor   Cancer Maternal Aunt        breast   Diabetes Brother    Kidney disease Father 39       kidney disease   Heart disease Father     Allergies  Allergen Reactions   Doxycycline Hives    "Burned inside my body, lips split open and bled"   Sulfonamide Derivatives Hives    "burned inside my body, inside my mouth"   Amoxicillin     REACTION: rash   Nevirapine     REACTION: Stevens-Johnson syndrome   Penicillins     Health Maintenance  Topic Date Due   COVID-19 Vaccine (1) Never done    TETANUS/TDAP  Never done   Zoster Vaccines- Shingrix (1 of 2) Never done   COLONOSCOPY (Pts 45-76yrs Insurance coverage will need to be confirmed)  02/10/2021   INFLUENZA VACCINE  Completed   Hepatitis C Screening  Completed   HIV Screening  Completed   HPV VACCINES  Aged Out    Objective:  Vitals:   11/12/20 0914  BP: (!) 151/88  Pulse: (!) 56  Temp: 97.8 F (36.6 C)  TempSrc: Oral  SpO2: 94%  Weight: 215 lb (97.5 kg)   Body mass index is 27.6 kg/m.  Physical Exam Constitutional:      Comments: He is in good spirits.  HENT:     Mouth/Throat:     Mouth: Mucous membranes are moist.     Pharynx: No oropharyngeal exudate.     Comments: His teeth are in very good condition. Cardiovascular:     Rate and Rhythm: Normal rate and regular rhythm.     Heart sounds: No murmur heard. Pulmonary:     Effort: Pulmonary effort is normal.     Breath sounds: Normal breath sounds.  Skin:    Findings: No rash.  Neurological:     Gait: Gait normal.  Psychiatric:        Mood and Affect: Mood normal.    Lab Results Lab Results  Component Value Date   WBC 3.9 10/27/2020   HGB 13.2 10/27/2020   HCT 38.4 (L) 10/27/2020   MCV 109.7 (H) 10/27/2020   PLT 187 10/27/2020    Lab Results  Component Value Date   CREATININE 1.00 10/27/2020   BUN 14 10/27/2020   NA 139 10/27/2020   K 4.2 10/27/2020   CL 105 10/27/2020   CO2 27 10/27/2020    Lab Results  Component Value Date   ALT 22 10/27/2020   AST 25 10/27/2020   ALKPHOS 52 07/21/2016   BILITOT 0.6 10/27/2020    Lab Results  Component Value Date   CHOL 230 (H) 10/27/2020   HDL 76 10/27/2020   LDLCALC 125 (H) 10/27/2020   TRIG 176 (H) 10/27/2020   CHOLHDL 3.0 10/27/2020   Lab Results  Component Value Date   LABRPR NON-REACTIVE 10/27/2020   HIV 1 RNA Quant  Date Value  10/27/2020 61 Copies/mL (H)  10/29/2019 92 Copies/mL (H)  08/30/2018 75 copies/mL (H)   CD4 T Cell Abs (/uL)  Date Value  10/27/2020 171 (L)   10/29/2019 198 (L)  08/30/2018 142 (L)     Problem List Items Addressed This Visit  1.   Human immunodeficiency virus (HIV) disease ([place])    His infection has been under good, long-term control over the past decade since his adherence has improved.  His CD4 count remains less than 200 but his risk for opportunistic infections is very low.  He will continue his current antiretroviral regimen and dapsone and follow-up after lab work in 1 year.      Relevant Medications   dapsone 100 MG tablet   elvitegravir-cobicistat-emtricitabine-tenofovir (GENVOYA) 150-150-200-10 MG TABS tablet   darunavir (PREZISTA) 800 MG tablet   Other Relevant Orders   CBC   T-helper cell (CD4)- ([facility] clinic only)   Comprehensive metabolic panel   Lipid panel   RPR   HIV-1 RNA quant-no reflex-bld     Unprioritized   H/O degenerative disc disease    He has had recent acute on chronic back pain.  We will help him find a PCP.      Dyslipidemia    We will help him find a PCP.      Other Visit Diagnoses     Acquired immune deficiency syndrome ([place])       Relevant Medications   dapsone 100 MG tablet   elvitegravir-cobicistat-emtricitabine-tenofovir (GENVOYA) 150-150-200-10 MG TABS tablet   darunavir (PREZISTA) 800 MG tablet         Michel Bickers, MD [facility] for [place] [phone] pager   [phone] cell 11/12/2020, 9:42 AM

## 2020-11-12 NOTE — Assessment & Plan Note (Signed)
He has had recent acute on chronic back pain.  We will help him find a PCP.

## 2020-11-12 NOTE — Assessment & Plan Note (Signed)
We will help him find a PCP.

## 2020-11-21 ENCOUNTER — Other Ambulatory Visit: Payer: Self-pay | Admitting: Internal Medicine

## 2020-11-21 DIAGNOSIS — B20 Human immunodeficiency virus [HIV] disease: Secondary | ICD-10-CM

## 2021-11-17 ENCOUNTER — Other Ambulatory Visit (HOSPITAL_COMMUNITY)
Admission: RE | Admit: 2021-11-17 | Discharge: 2021-11-17 | Disposition: A | Discharge status: Home / Self Care | Payer: 59 | Source: Ambulatory Visit | Attending: Internal Medicine | Admitting: Internal Medicine

## 2021-11-17 ENCOUNTER — Other Ambulatory Visit: Payer: Commercial Managed Care - PPO

## 2021-11-17 ENCOUNTER — Other Ambulatory Visit: Payer: Self-pay

## 2021-11-17 DIAGNOSIS — Z79899 Other long term (current) drug therapy: Secondary | ICD-10-CM | POA: Diagnosis present

## 2021-11-17 DIAGNOSIS — B20 Human immunodeficiency virus [HIV] disease: Secondary | ICD-10-CM

## 2021-11-17 DIAGNOSIS — Z113 Encounter for screening for infections with a predominantly sexual mode of transmission: Secondary | ICD-10-CM

## 2021-11-18 LAB — URINE CYTOLOGY ANCILLARY ONLY
Chlamydia: NEGATIVE
Comment: NEGATIVE
Comment: NORMAL
Neisseria Gonorrhea: NEGATIVE

## 2021-11-18 LAB — T-HELPER CELL (CD4) - (RCID CLINIC ONLY)
CD4 % Helper T Cell: 12 % — ABNORMAL LOW (ref 33–65)
CD4 T Cell Abs: 203 /uL — ABNORMAL LOW (ref 400–1790)

## 2021-11-20 LAB — HIV-1 RNA QUANT-NO REFLEX-BLD
HIV 1 RNA Quant: 37 Copies/mL — ABNORMAL HIGH
HIV-1 RNA Quant, Log: 1.57 Log cps/mL — ABNORMAL HIGH

## 2021-11-20 LAB — CBC WITH DIFFERENTIAL/PLATELET
Absolute Monocytes: 529 cells/uL (ref 200–950)
Basophils Absolute: 50 cells/uL (ref 0–200)
Basophils Relative: 1.2 %
Eosinophils Absolute: 42 cells/uL (ref 15–500)
Eosinophils Relative: 1 %
HCT: 37 % — ABNORMAL LOW (ref 38.5–50.0)
Hemoglobin: 12.8 g/dL — ABNORMAL LOW (ref 13.2–17.1)
Lymphs Abs: 1655 cells/uL (ref 850–3900)
MCH: 38.9 pg — ABNORMAL HIGH (ref 27.0–33.0)
MCHC: 34.6 g/dL (ref 32.0–36.0)
MCV: 112.5 fL — ABNORMAL HIGH (ref 80.0–100.0)
MPV: 9.4 fL (ref 7.5–12.5)
Monocytes Relative: 12.6 %
Neutro Abs: 1924 cells/uL (ref 1500–7800)
Neutrophils Relative %: 45.8 %
Platelets: 208 10*3/uL (ref 140–400)
RBC: 3.29 10*6/uL — ABNORMAL LOW (ref 4.20–5.80)
RDW: 11.4 % (ref 11.0–15.0)
Total Lymphocyte: 39.4 %
WBC: 4.2 10*3/uL (ref 3.8–10.8)

## 2021-11-20 LAB — COMPLETE METABOLIC PANEL WITH GFR
AG Ratio: 1.8 (calc) (ref 1.0–2.5)
ALT: 25 U/L (ref 9–46)
AST: 23 U/L (ref 10–35)
Albumin: 4.4 g/dL (ref 3.6–5.1)
Alkaline phosphatase (APISO): 62 U/L (ref 35–144)
BUN: 18 mg/dL (ref 7–25)
CO2: 28 mmol/L (ref 20–32)
Calcium: 8.8 mg/dL (ref 8.6–10.3)
Chloride: 103 mmol/L (ref 98–110)
Creat: 0.97 mg/dL (ref 0.70–1.35)
Globulin: 2.4 g/dL (calc) (ref 1.9–3.7)
Glucose, Bld: 106 mg/dL — ABNORMAL HIGH (ref 65–99)
Potassium: 4.3 mmol/L (ref 3.5–5.3)
Sodium: 138 mmol/L (ref 135–146)
Total Bilirubin: 0.9 mg/dL (ref 0.2–1.2)
Total Protein: 6.8 g/dL (ref 6.1–8.1)
eGFR: 89 mL/min/{1.73_m2} (ref >=60)

## 2021-11-20 LAB — LIPID PANEL
Cholesterol: 216 mg/dL — ABNORMAL HIGH (ref <200)
HDL: 76 mg/dL (ref >=40)
LDL Cholesterol (Calc): 122 mg/dL (calc) — ABNORMAL HIGH
Non-HDL Cholesterol (Calc): 140 mg/dL (calc) — ABNORMAL HIGH (ref <130)
Total CHOL/HDL Ratio: 2.8 (calc) (ref <5.0)
Triglycerides: 81 mg/dL (ref <150)

## 2021-11-20 LAB — RPR: RPR Ser Ql: NONREACTIVE

## 2021-12-01 ENCOUNTER — Other Ambulatory Visit: Payer: Self-pay

## 2021-12-01 ENCOUNTER — Ambulatory Visit (INDEPENDENT_AMBULATORY_CARE_PROVIDER_SITE_OTHER): Payer: 59 | Admitting: Internal Medicine

## 2021-12-01 ENCOUNTER — Encounter: Payer: Self-pay | Admitting: Internal Medicine

## 2021-12-01 ENCOUNTER — Ambulatory Visit (INDEPENDENT_AMBULATORY_CARE_PROVIDER_SITE_OTHER): Payer: 59

## 2021-12-01 DIAGNOSIS — Z23 Encounter for immunization: Secondary | ICD-10-CM

## 2021-12-01 DIAGNOSIS — R5382 Chronic fatigue, unspecified: Secondary | ICD-10-CM | POA: Diagnosis not present

## 2021-12-01 DIAGNOSIS — B20 Human immunodeficiency virus [HIV] disease: Secondary | ICD-10-CM | POA: Diagnosis not present

## 2021-12-01 DIAGNOSIS — M5416 Radiculopathy, lumbar region: Secondary | ICD-10-CM

## 2021-12-01 MED ORDER — DAPSONE 100 MG PO TABS: 100.0000 mg | ORAL_TABLET | Freq: Every morning | ORAL | 11 refills | Status: DC

## 2021-12-01 MED ORDER — DARUNAVIR 800 MG PO TABS: 800.0000 mg | ORAL_TABLET | Freq: Every day | ORAL | 11 refills | Status: DC

## 2021-12-01 MED ORDER — GENVOYA 150-150-200-10 MG PO TABS: 1.0000 | ORAL_TABLET | Freq: Every day | ORAL | 11 refills | Status: DC

## 2021-12-01 NOTE — Assessment & Plan Note (Signed)
His chronic low back pain and left leg radiculopathy remains unchanged but is somewhat limiting to his activity.  He plans to address this with Dr. Jerline Pain when they meet for an initial visit in December.  I encouraged him to try to get back to his gym for more regular exercise.

## 2021-12-01 NOTE — Assessment & Plan Note (Signed)
I suspect that his fatigue is multifactorial.  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

## 2021-12-01 NOTE — Assessment & Plan Note (Signed)
His infection remains under good, long-term control.  For years he had difficulty with adherence but that problem resolved years ago and his viral load has been low and stable.  He is unlikely to ever have complete CD4 reconstitution but his CD4 count is now slightly over 200.  I will continue dapsone prophylaxis for now at least until he has repeat blood work in 6 months.  He will continue [place] and Prezista.  He received the updated COVID vaccine here today and plans on getting an annual influenza vaccine at work within the next week.

## 2021-12-01 NOTE — Progress Notes (Signed)
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 s/p Nigro protocol for anal cancer 11/10/2011   Fatigue    Former smoker 11/27/2009   Chronic cholecystitis with calculus 10/22/2008   H/O degenerative disc disease 12/26/2007   Lumbar radiculopathy, chronic 10/05/2007    Patient's Medications  New Prescriptions   No medications on file  Previous Medications   DARUNAVIR (PREZISTA) 800 MG TABLET    TAKE 1 TABLET BY MOUTH  EVERY DAY WITH BREAKFAST and Genvoya   PREZISTA 800 MG TABLET    TAKE 1 TABLET BY MOUTH DAILY WITH BREAKFAST AND GENVOYA  Modified Medications   Modified Medication Previous Medication   DAPSONE 100 MG TABLET dapsone 100 MG tablet      Take 1 tablet (100 mg total) by mouth every morning.    TAKE 1 TABLET BY MOUTH IN THE MORNING   DARUNAVIR (PREZISTA) 800 MG TABLET darunavir (PREZISTA) 800 MG tablet      Take 1 tablet (800 mg total) by mouth daily with breakfast.    Take 1 tablet (800 mg total) by mouth daily with breakfast.   ELVITEGRAVIR-COBICISTAT-EMTRICITABINE-TENOFOVIR (GENVOYA) 150-150-200-10 MG TABS TABLET GENVOYA 150-150-200-10 MG TABS tablet      Take 1 tablet by mouth daily with breakfast.    TAKE 1 TABLET BY MOUTH DAILY WITH BREAKFAST AND PREZISTA  Discontinued Medications   No medications on file    Subjective: Matthew Cortez is in for his routine HIV follow-up visit.  He denies any problems obtaining, taking or tolerating his Genvoya, Prezista or dapsone.  They are mailed to him each month.  He does not recall missing any doses.  He has been feeling a little more fatigued than normal recently.  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  His mother died in 2022-11-14 after a long battle with intestinal cancer.  He denies feeling unusually anxious or depressed.  He does walk his dogs but otherwise gets no regular exercise.  He says that he is sleeping well.  He continues to be bothered by low back pain and left leg radicular pain.  He says that he is finally gotten a new PCP, Dr. Dimas Chyle and will be seeing him for the first time in December.  Review of Systems: Review of Systems  Constitutional:  Positive for malaise/fatigue. Negative for fever and weight loss.  Respiratory:  Negative for cough and shortness of breath.   Cardiovascular:  Negative for chest pain.  Musculoskeletal:  Positive for back pain.  Psychiatric/Behavioral:  Negative for depression. The patient is not nervous/anxious and does not have insomnia.     Past Medical History:  Diagnosis Date   Allergy    Anal condyloma    Cancer ([place]) 02/11/2011   squamous cell carcinoma of anus   Cholelithiasis    DDD (degenerative disc disease)    DDD (degenerative disc disease)    History of radiation therapy 03/29/11- 05/07/11   EOT4/06/2011 anan ca, 52.2Gy  HIV infection ([place])    Pneumonia 15 yrs ago   Squamous cell carcinoma of anus ([place]) 03/08/2011    Social History   Tobacco Use   Smoking status: Former    Packs/day: 0.25    Years: 20.00    Total pack years: 5.00    Types: Cigarettes    Quit date: 03/07/2010    Years since quitting: 11.7   Smokeless tobacco: Never  Substance Use Topics   Alcohol use: Yes    Alcohol/week: 12.0 standard drinks of alcohol    Types: 12 drink(s) per week    Comment: 3 glasses of wine per week, none for past 2-3 mos   Drug use: No    Comment: quit smoking 03/2010    Family History  Problem Relation Age of Onset   Cancer Mother        abdominal tumor   Cancer Maternal  Aunt        breast   Diabetes Brother    Kidney disease Father 52       kidney disease   Heart disease Father     Allergies  Allergen Reactions   Doxycycline Hives    "Burned inside my body, lips split open and bled"   Sulfonamide Derivatives Hives    "burned inside my body, inside my mouth"   Amoxicillin     REACTION: rash   Nevirapine     REACTION: Stevens-Johnson syndrome   Penicillins     Health Maintenance  Topic Date Due   COVID-19 Vaccine (1) Never done   TETANUS/TDAP  Never done   Zoster Vaccines- Shingrix (1 of 2) Never done   COLONOSCOPY (Pts 45-84yr Insurance coverage will need to be confirmed)  02/10/2021   INFLUENZA VACCINE  09/01/2021   Hepatitis C Screening  Completed   HIV Screening  Completed   HPV VACCINES  Aged Out    Objective:  Vitals:   12/01/21 1543  BP: 139/73  Pulse: (!) 58  Temp: 98 F (36.7 C)  TempSrc: Oral  Weight: 216 lb (98 kg)  Height: '6\' 2"'$  (1.88 m)   Body mass index is 27.73 kg/m.  Physical Exam Constitutional:      Comments: He is in good spirits.  Cardiovascular:     Rate and Rhythm: Normal rate and regular rhythm.     Heart sounds: No murmur heard. Pulmonary:     Effort: Pulmonary effort is normal.     Breath sounds: Normal breath sounds.  Abdominal:     Palpations: Abdomen is soft.     Tenderness: There is no abdominal tenderness.  Psychiatric:        Mood and Affect: Mood normal.     Lab Results Lab Results  Component Value Date   WBC 4.2 11/17/2021   HGB 12.8 (L) 11/17/2021   HCT 37.0 (L) 11/17/2021   MCV 112.5 (H) 11/17/2021   PLT 208 11/17/2021    Lab Results  Component Value Date   CREATININE 0.97 11/17/2021   BUN 18 11/17/2021   NA 138 11/17/2021   K 4.3 11/17/2021   CL 103 11/17/2021   CO2 28 11/17/2021    Lab Results  Component Value Date   ALT 25 11/17/2021   AST 23 11/17/2021   ALKPHOS 52 07/21/2016   BILITOT 0.9 11/17/2021    Lab Results  Component Value Date   CHOL 216 (H)  11/17/2021   HDL 76 11/17/2021   LDLCALC 122 (H) 11/17/2021   TRIG 81 11/17/2021  CHOLHDL 2.8 11/17/2021   Lab Results  Component Value Date   LABRPR NON-REACTIVE 11/17/2021   HIV 1 RNA Quant (Copies/mL)  Date Value  11/17/2021 37 (H)  10/27/2020 61 (H)  10/29/2019 92 (H)   CD4 T Cell Abs (/uL)  Date Value  11/17/2021 203 (L)  10/27/2020 171 (L)  10/29/2019 198 (L)     Problem List Items Addressed This Visit       High   Human immunodeficiency virus (HIV) disease ([place])    His infection remains under good, long-term control.  For years he had difficulty with adherence but that problem resolved years ago and his viral load has been low and stable.  He is unlikely to ever have complete CD4 reconstitution but his CD4 count is now slightly over 200.  I will continue dapsone prophylaxis for now at least until he has repeat blood work in 6 months.  He will continue [place] and Prezista.  He received the updated COVID vaccine here today and plans on getting an annual influenza vaccine at work within the next week.      Relevant Medications   dapsone 100 MG tablet   elvitegravir-cobicistat-emtricitabine-tenofovir (GENVOYA) 150-150-200-10 MG TABS tablet   darunavir (PREZISTA) 800 MG tablet   Other Relevant Orders   T-helper cells (CD4) count (not at [facility])   HIV-1 RNA quant-no reflex-bld   CBC   Comprehensive metabolic panel   Lipid panel   RPR     Unprioritized   Lumbar radiculopathy, chronic    His chronic low back pain and left leg radiculopathy remains unchanged but is somewhat limiting to his activity.  He plans to address this with Dr. Jerline Pain when they meet for an initial visit in December.  I encouraged him to try to get back to his gym for more regular exercise.      Fatigue    I suspect that his fatigue is multifactorial.  He denies feeling depressed but I suspect that there are components related to his mother's recent death, the upcoming change in his job when his  [employer] store closes and the lack of regular exercise.      Other Visit Diagnoses     Acquired immune deficiency syndrome ([place])       Relevant Medications   dapsone 100 MG tablet   elvitegravir-cobicistat-emtricitabine-tenofovir (GENVOYA) 150-150-200-10 MG TABS tablet   darunavir (PREZISTA) 800 MG tablet         Michel Bickers, MD [facility] [place] [phone] pager   [phone] cell 12/01/2021, 4:49 PM

## 2022-01-26 ENCOUNTER — Ambulatory Visit: Payer: Commercial Managed Care - PPO | Admitting: Family Medicine

## 2022-04-05 ENCOUNTER — Encounter: Payer: Self-pay | Admitting: Gastroenterology

## 2022-04-05 ENCOUNTER — Encounter: Payer: Self-pay | Admitting: Family Medicine

## 2022-04-05 ENCOUNTER — Ambulatory Visit (INDEPENDENT_AMBULATORY_CARE_PROVIDER_SITE_OTHER): Payer: 59 | Admitting: Family Medicine

## 2022-04-05 VITALS — BP 131/71 | HR 57 | Temp 97.1°F | Ht 74.0 in | Wt 223.2 lb

## 2022-04-05 DIAGNOSIS — R739 Hyperglycemia, unspecified: Secondary | ICD-10-CM | POA: Diagnosis not present

## 2022-04-05 DIAGNOSIS — E785 Hyperlipidemia, unspecified: Secondary | ICD-10-CM | POA: Diagnosis not present

## 2022-04-05 DIAGNOSIS — M5416 Radiculopathy, lumbar region: Secondary | ICD-10-CM | POA: Diagnosis not present

## 2022-04-05 DIAGNOSIS — B20 Human immunodeficiency virus [HIV] disease: Secondary | ICD-10-CM

## 2022-04-05 DIAGNOSIS — Z1211 Encounter for screening for malignant neoplasm of colon: Secondary | ICD-10-CM

## 2022-04-05 LAB — CBC
HCT: 37.2 % — ABNORMAL LOW (ref 39.0–52.0)
Hemoglobin: 13 g/dL (ref 13.0–17.0)
MCHC: 34.8 g/dL (ref 30.0–36.0)
MCV: 108.8 fl — ABNORMAL HIGH (ref 78.0–100.0)
Platelets: 224 10*3/uL (ref 150.0–400.0)
RBC: 3.42 Mil/uL — ABNORMAL LOW (ref 4.22–5.81)
RDW: 12.6 % (ref 11.5–15.5)
WBC: 3.9 10*3/uL — ABNORMAL LOW (ref 4.0–10.5)

## 2022-04-05 LAB — VITAMIN B12: Vitamin B-12: 181 pg/mL — ABNORMAL LOW (ref 211–911)

## 2022-04-05 LAB — TSH: TSH: 2.02 u[IU]/mL (ref 0.35–5.50)

## 2022-04-05 LAB — COMPREHENSIVE METABOLIC PANEL
ALT: 26 U/L (ref 0–53)
AST: 22 U/L (ref 0–37)
Albumin: 4 g/dL (ref 3.5–5.2)
Alkaline Phosphatase: 60 U/L (ref 39–117)
BUN: 15 mg/dL (ref 6–23)
CO2: 26 mEq/L (ref 19–32)
Calcium: 8.8 mg/dL (ref 8.4–10.5)
Chloride: 106 mEq/L (ref 96–112)
Creatinine, Ser: 0.95 mg/dL (ref 0.40–1.50)
GFR: 86.81 mL/min (ref >60.00)
Glucose, Bld: 96 mg/dL (ref 70–99)
Potassium: 4.7 mEq/L (ref 3.5–5.1)
Sodium: 139 mEq/L (ref 135–145)
Total Bilirubin: 0.5 mg/dL (ref 0.2–1.2)
Total Protein: 6.4 g/dL (ref 6.0–8.3)

## 2022-04-05 LAB — HEMOGLOBIN A1C: Hgb A1c MFr Bld: 4.2 % — ABNORMAL LOW (ref 4.6–6.5)

## 2022-04-05 LAB — FOLATE: Folate: 18.6 ng/mL (ref >5.9)

## 2022-04-05 NOTE — Assessment & Plan Note (Signed)
Follows with ID. His current ID specialist, Dr Megan Salon will be retiring and he will be establishing with a new provider soon.  Currently on antiviral therapy and dapsone.

## 2022-04-05 NOTE — Assessment & Plan Note (Signed)
Check A1c today.

## 2022-04-05 NOTE — Patient Instructions (Signed)
It was very nice to see you today!  Your numbness in your feet is probably coming from your back but we will check blood work today to look for any other possible causes.  Depending on the results of your blood work but may need to refer you to see an orthopedist.  We will see you back in 1 year for your annual checkup with labs.  Come back sooner if needed.  Take care, Dr Jerline Pain  PLEASE NOTE:  If you had any lab tests, please let us know if you have not heard back within a few days. You may see your results on mychart before we have a chance to review them but we will give you a call once they are reviewed by [place].   If we ordered any referrals today, please let us know if you have not heard from their office within the next week.   If you had any urgent prescriptions sent in today, please check with the pharmacy within an hour of our visit to make sure the prescription was transmitted appropriately.   Please try these tips to maintain a healthy lifestyle:  Eat at least 3 REAL meals and 1-2 snacks per day.  Aim for no more than 5 hours between eating.  If you eat breakfast, please do so within one hour of getting up.   Each meal should contain half fruits/vegetables, one quarter protein, and one quarter carbs (no bigger than a computer mouse)  Cut down on sweet beverages. This includes juice, soda, and sweet tea.   Drink at least 1 glass of water with each meal and aim for at least 8 glasses per day  Exercise at least 150 minutes every week.

## 2022-04-05 NOTE — Assessment & Plan Note (Signed)
Continue lifestyle modifications.  We can recheck lipids next year.

## 2022-04-05 NOTE — Assessment & Plan Note (Signed)
Patient's bilateral foot numbness likely secondary to lumbar radiculopathy as diagnosed by his previous PCP.  Does have positive straight leg raise on exam today.  No red flag signs or symptoms.  We will check labs to look for any other potential metabolic causes including CBC, c-Met, B12, folate. TSH, and A1c.  If this is all nondiagnostic will need to be referred to sports medicine or orthopedics.

## 2022-04-05 NOTE — Progress Notes (Signed)
Matthew Cortez is a 62 y.o. male who presents today for an office visit.  He is a new patient.   Assessment/Plan:  Chronic Problems Addressed Today: Lumbar radiculopathy, chronic Patient's bilateral foot numbness likely secondary to lumbar radiculopathy as diagnosed by his previous PCP.  Does have positive straight leg raise on exam today.  No red flag signs or symptoms.  We will check labs to look for any other potential metabolic causes including CBC, c-Met, B12, folate. TSH, and A1c.  If this is all nondiagnostic will need to be referred to sports medicine or orthopedics.  Human immunodeficiency virus (HIV) disease Follows with ID. His current ID specialist, Dr Megan Salon will be retiring and he will be establishing with a new provider soon.  Currently on antiviral therapy and dapsone.   Dyslipidemia Continue lifestyle modifications.  We can recheck lipids next year.  Hyperglycemia Check A1c today.  Will refer for colonoscopy.    Subjective:  HPI:  See A/p for status of chronic conditions. He is here to establish care today.   He has been having some numbness in his feet.  This has been going on for several years. Numbness is can go up his legs. Symptoms seem to be about the same the last few months. No treatments tried. He does have some lower back pain. No reported bowel or bladder incontinence.   ROS: Per HPI, otherwise a complete review of systems was negative.   PMH:  The following were reviewed and entered/updated in epic: Past Medical History:  Diagnosis Date   Allergy    Anal condyloma    Cancer ([place]) 02/11/2011   squamous cell carcinoma of anus   Cholelithiasis    DDD (degenerative disc disease)    DDD (degenerative disc disease)    History of radiation therapy 03/29/11- 05/07/11   EOT4/06/2011 anan ca, 52.2Gy   HIV infection ([place])    Pneumonia 15 yrs ago   Squamous cell carcinoma of anus ([place]) 03/08/2011   Patient Active Problem List   Diagnosis Date Noted    Dyslipidemia 11/12/2020   Hyperglycemia 04/17/2013   Fatigue    Squamous cell carcinoma of anus ([place]) 03/08/2011   Former smoker 11/27/2009   Chronic cholecystitis with calculus 10/22/2008   Lumbar radiculopathy, chronic 10/05/2007   Human immunodeficiency virus (HIV) disease ([place]) 05/30/2007   Past Surgical History:  Procedure Laterality Date   COLONOSCOPY  !/14/2014   [facility]   NO PAST SURGERIES     RECTAL BIOPSY  10/22/2011   Procedure: BIOPSY RECTAL;  Surgeon: Adin Hector, MD;  Location: [facility];  Service: General;  Laterality: N/A;    Family History  Problem Relation Age of Onset   Cancer Mother        abdominal tumor   Kidney disease Father 10       kidney disease   Heart disease Father    Diabetes Brother    Cancer Maternal Aunt        breast    Medications- reviewed and updated Current Outpatient Medications  Medication Sig Dispense Refill   dapsone 100 MG tablet Take 1 tablet (100 mg total) by mouth every morning. 30 tablet 11   darunavir (PREZISTA) 800 MG tablet Take 1 tablet (800 mg total) by mouth daily with breakfast. 30 tablet 11   elvitegravir-cobicistat-emtricitabine-tenofovir (GENVOYA) 150-150-200-10 MG TABS tablet Take 1 tablet by mouth daily with breakfast. 30 tablet 11   No current facility-administered medications for this visit.    Allergies-reviewed  and updated Allergies  Allergen Reactions   Doxycycline Hives    "Burned inside my body, lips split open and bled"   Sulfonamide Derivatives Hives    "burned inside my body, inside my mouth"   Amoxicillin     REACTION: rash   Nevirapine     REACTION: Stevens-Johnson syndrome   Penicillins     Social History   Socioeconomic History   Marital status: Divorced    Spouse name: Not on file   Number of children: Not on file   Years of education: Not on file   Highest education level: Not on file  Occupational History   Not on file  Tobacco Use   Smoking status: Former    Packs/day: 0.25     Years: 20.00    Total pack years: 5.00    Types: Cigarettes    Quit date: 03/07/2010    Years since quitting: 12.0   Smokeless tobacco: Never  Substance and Sexual Activity   Alcohol use: Yes    Alcohol/week: 12.0 standard drinks of alcohol    Types: 12 drink(s) per week    Comment: 3 glasses of wine per week, none for past 2-3 mos   Drug use: No    Comment: quit smoking 03/2010   Sexual activity: Not Currently    Comment: declined condoms  Other Topics Concern   Not on file  Social History Narrative   Not on file   Social Determinants of Health   Financial Resource Strain: Not on file  Food Insecurity: Not on file  Transportation Needs: Not on file  Physical Activity: Not on file  Stress: Not on file  Social Connections: Not on file          Objective:  Physical Exam: BP 131/71   Pulse (!) 57   Temp (!) 97.1 F (36.2 C) (Temporal)   Ht '6\' 2"'$  (1.88 m)   Wt 223 lb 3.2 oz (101.2 kg)   SpO2 99%   BMI 28.66 kg/m   Gen: No acute distress, resting comfortably CV: Regular rate and rhythm with no murmurs appreciated Pulm: Normal work of breathing, clear to auscultation bilaterally with no crackles, wheezes, or rhonchi MSK: - Back: No deformities.  Nontender to palpation - Lower extremities: No deformities.  Full range of motion throughout.  Strength 5 out of 5 bilaterally.  Positive straight leg raise.  Sensation to light touch intact throughout.  Neurovascular intact distally. Neuro: Grossly normal, moves all extremities Psych: Normal affect and thought content      Sarye Kath M. Jerline Pain, MD 04/05/2022 8:18 AM

## 2022-04-07 ENCOUNTER — Other Ambulatory Visit: Payer: Self-pay

## 2022-04-07 DIAGNOSIS — E538 Deficiency of other specified B group vitamins: Secondary | ICD-10-CM

## 2022-04-07 NOTE — Progress Notes (Signed)
Please inform patient of the following:  His B12 is low.  This is probably causing some of his nerve pain.  Recommend starting B12 protocol here.  Everything else is stable and we can recheck in a year.

## 2022-04-08 ENCOUNTER — Ambulatory Visit (INDEPENDENT_AMBULATORY_CARE_PROVIDER_SITE_OTHER): Payer: 59

## 2022-04-08 DIAGNOSIS — E538 Deficiency of other specified B group vitamins: Secondary | ICD-10-CM | POA: Diagnosis not present

## 2022-04-08 MED ORDER — CYANOCOBALAMIN 1000 MCG/ML IJ SOLN
1000.0000 ug | Freq: Once | INTRAMUSCULAR | Status: AC
  Administered 2022-04-12: 1000 ug via INTRAMUSCULAR

## 2022-04-08 NOTE — Progress Notes (Signed)
Matthew Cortez 62 yr old male presents to office today for 1st of   Weekly B12 injections per Dimas Chyle, MD. Administered CYANOCOBALAMIN 1,000 mcg IM left arm. Patient tolerated well.

## 2022-04-15 ENCOUNTER — Ambulatory Visit (INDEPENDENT_AMBULATORY_CARE_PROVIDER_SITE_OTHER): Payer: 59

## 2022-04-15 DIAGNOSIS — E538 Deficiency of other specified B group vitamins: Secondary | ICD-10-CM | POA: Diagnosis not present

## 2022-04-15 MED ORDER — CYANOCOBALAMIN 1000 MCG/ML IJ SOLN
1000.0000 ug | Freq: Once | INTRAMUSCULAR | Status: AC
  Administered 2022-04-15: 1000 ug via INTRAMUSCULAR

## 2022-04-15 NOTE — Progress Notes (Signed)
Pt received b12 in left deltoid, pt tolerated well 

## 2022-04-21 ENCOUNTER — Telehealth: Payer: Self-pay | Admitting: *Deleted

## 2022-04-21 NOTE — Telephone Encounter (Signed)
Pt.scheduled as a direct,colonoscopy on 05/31/22,h/o squamous cell carcinoma of anus,please review chart and advise if need OV or proceed with scheduled procedure?

## 2022-04-22 ENCOUNTER — Ambulatory Visit (INDEPENDENT_AMBULATORY_CARE_PROVIDER_SITE_OTHER): Payer: 59

## 2022-04-22 DIAGNOSIS — E538 Deficiency of other specified B group vitamins: Secondary | ICD-10-CM

## 2022-04-22 MED ORDER — CYANOCOBALAMIN 1000 MCG/ML IJ SOLN
1000.0000 ug | Freq: Once | INTRAMUSCULAR | Status: AC
  Administered 2022-04-22: 1000 ug via INTRAMUSCULAR

## 2022-04-22 NOTE — Telephone Encounter (Signed)
Noted  

## 2022-04-22 NOTE — Progress Notes (Signed)
Pt received b12 in left deltoid, pt tolerated well. Pt did voice that after the 1st two shots, he gets nausea, pt states it doesn't last long but that he never gets nausea until now.

## 2022-04-29 ENCOUNTER — Ambulatory Visit (INDEPENDENT_AMBULATORY_CARE_PROVIDER_SITE_OTHER): Payer: 59

## 2022-04-29 DIAGNOSIS — E538 Deficiency of other specified B group vitamins: Secondary | ICD-10-CM

## 2022-04-29 MED ORDER — CYANOCOBALAMIN 1000 MCG/ML IJ SOLN
1000.0000 ug | Freq: Once | INTRAMUSCULAR | Status: AC
  Administered 2022-04-29: 1000 ug via INTRAMUSCULAR

## 2022-04-29 NOTE — Progress Notes (Signed)
Pt received b12 in left deltoid, pt tolerated well 

## 2022-05-10 ENCOUNTER — Ambulatory Visit (AMBULATORY_SURGERY_CENTER): Payer: Self-pay | Admitting: *Deleted

## 2022-05-10 VITALS — Ht 74.0 in | Wt 230.0 lb

## 2022-05-10 DIAGNOSIS — Z1211 Encounter for screening for malignant neoplasm of colon: Secondary | ICD-10-CM

## 2022-05-10 MED ORDER — NA SULFATE-K SULFATE-MG SULF 17.5-3.13-1.6 GM/177ML PO SOLN: 1.0000 | Freq: Once | ORAL | 0 refills | Status: AC

## 2022-05-10 NOTE — Progress Notes (Signed)

## 2022-05-31 ENCOUNTER — Encounter: Payer: No Typology Code available for payment source | Admitting: Gastroenterology

## 2022-11-05 ENCOUNTER — Other Ambulatory Visit: Payer: Self-pay | Admitting: Family Medicine

## 2022-11-05 DIAGNOSIS — Z1212 Encounter for screening for malignant neoplasm of rectum: Secondary | ICD-10-CM

## 2022-11-05 DIAGNOSIS — Z1211 Encounter for screening for malignant neoplasm of colon: Secondary | ICD-10-CM

## 2023-04-06 ENCOUNTER — Encounter: Payer: Self-pay | Admitting: Family Medicine

## 2023-06-13 NOTE — Progress Notes (Signed)
 The ASCVD Risk score (Arnett DK, et al., 2019) failed to calculate for the following reasons:   The systolic blood pressure is missing  Arlon Bergamo, BSN, Charity fundraiser

## 2023-06-23 ENCOUNTER — Encounter: Payer: Self-pay | Admitting: Internal Medicine

## 2023-06-23 ENCOUNTER — Ambulatory Visit (INDEPENDENT_AMBULATORY_CARE_PROVIDER_SITE_OTHER): Payer: Self-pay | Admitting: Internal Medicine

## 2023-06-23 DIAGNOSIS — Z538 Procedure and treatment not carried out for other reasons: Secondary | ICD-10-CM

## 2023-06-24 ENCOUNTER — Ambulatory Visit: Payer: Self-pay | Admitting: Infectious Diseases

## 2023-07-04 ENCOUNTER — Encounter: Payer: Self-pay | Admitting: Infectious Diseases

## 2023-07-04 ENCOUNTER — Other Ambulatory Visit: Payer: Self-pay

## 2023-07-04 ENCOUNTER — Ambulatory Visit: Payer: Self-pay | Admitting: Infectious Diseases

## 2023-07-04 VITALS — BP 165/93 | HR 57 | Temp 97.9°F | Ht 74.0 in | Wt 226.0 lb

## 2023-07-04 DIAGNOSIS — Z79899 Other long term (current) drug therapy: Secondary | ICD-10-CM | POA: Insufficient documentation

## 2023-07-04 DIAGNOSIS — E785 Hyperlipidemia, unspecified: Secondary | ICD-10-CM

## 2023-07-04 DIAGNOSIS — Z0000 Encounter for general adult medical examination without abnormal findings: Secondary | ICD-10-CM

## 2023-07-04 DIAGNOSIS — Z23 Encounter for immunization: Secondary | ICD-10-CM | POA: Diagnosis not present

## 2023-07-04 DIAGNOSIS — B20 Human immunodeficiency virus [HIV] disease: Secondary | ICD-10-CM | POA: Insufficient documentation

## 2023-07-04 DIAGNOSIS — Z113 Encounter for screening for infections with a predominantly sexual mode of transmission: Secondary | ICD-10-CM | POA: Insufficient documentation

## 2023-07-04 MED ORDER — GENVOYA 150-150-200-10 MG PO TABS
1.0000 | ORAL_TABLET | Freq: Every day | ORAL | 11 refills | Status: DC
Start: 2023-07-04 — End: 2023-07-14

## 2023-07-04 MED ORDER — DARUNAVIR 800 MG PO TABS: 800.0000 mg | ORAL_TABLET | Freq: Every day | ORAL | 11 refills | Status: DC

## 2023-07-04 MED ORDER — DAPSONE 100 MG PO TABS
100.0000 mg | ORAL_TABLET | Freq: Every morning | ORAL | 11 refills | Status: DC
Start: 2023-07-04 — End: 2023-07-14

## 2023-07-04 NOTE — Progress Notes (Unsigned)
 [address], [place], [place], [address]                                                                  Phn. [phone]; Fax: [phone]                                                                             Date: 07/04/23  Reason for Visit: Routine HIV care.  HPI: Matthew Cortez is a 63 y.o.old male MSM, anal ca s/ p treatment, DDD, HIV well controlled on Prezista /Genvoya  and Dapsone , Dyslipidemia who is here for regular fu. Patient previously followed by Dr Daina Drum.   Reports compliance with Prezista , Genvoya , and Dapsone , taking consistently every morning with food. He experiences no issues with adherence or obtaining medications. Reports he is due to submit a stool sample for colon ca screening and has a new PCP that he follows, last seen 04/05/22. Reports following with dentist as well. Reports has received shingles vaccine and willing to get PCV 2o vaccine.   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 He is not sexually active with anyone other than his husband. He does not smoke or use recreational drugs and consumes wine occasionally. He has two small dogs.   No complaints today   ROS: As stated in above HPI; all other systems were reviewed and are otherwise negative unless noted below  No reported fever / chills, night sweats, unintentional weight loss, acute visual change, odynophagia, chest pain/pressure, new or worsened SOB or WOB, nausea, vomiting, diarrhea, dysuria, GU discharge, syncope, seizures, red/hot swollen joints, hallucinations / delusions, rashes, new allergies, unusual / excessive bleeding, swollen lymph nodes, or new hospitalizations/ED visits/Urgent Care visits since the pt was last seen.  PMH/ PSH/ FamHx / Social Hx , medications and  allergies reviewed and updated as appropriate; please see corresponding tab in EHR / prior notes                                        No current outpatient medications on file prior to visit.   No current facility-administered medications on file prior to visit.  '  Allergies  Allergen Reactions   Doxycycline Hives    "Burned inside my body, lips split open and bled"   Sulfonamide Derivatives Hives    "burned inside my body, inside my mouth"   Amoxicillin     REACTION: rash   Nevirapine     REACTION: Stevens-Johnson syndrome   Penicillins    Past  Medical History:  Diagnosis Date   Allergy    Anal condyloma    Cancer (HCC) 02/11/2011   squamous cell carcinoma of anus   Cholelithiasis    DDD (degenerative disc disease)    DDD (degenerative disc disease)    History of radiation therapy 03/29/11- 05/07/11   EOT4/06/2011 anan ca, 52.2Gy   HIV infection (HCC)    Pneumonia 15 yrs ago   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   Past Surgical History:  Procedure Laterality Date   COLONOSCOPY  !/14/2014   [facility]   RECTAL BIOPSY  10/22/2011   Procedure: BIOPSY RECTAL;  Surgeon: Eddye Goodie, MD;  Location: [facility];  Service: General;  Laterality: N/A;   Social History   Socioeconomic History   Marital status: Divorced    Spouse name: Not on file   Number of children: Not on file   Years of education: Not on file   Highest education level: Not on file  Occupational History   Not on file  Tobacco Use   Smoking status: Former    Current packs/day: 0.00    Average packs/day: 0.3 packs/day for 20.0 years (5.0 ttl pk-yrs)    Types: Cigarettes    Start date: 03/07/1990    Quit date: 03/07/2010    Years since quitting: 13.3    Passive exposure: Never   Smokeless tobacco: Never  Vaping Use   Vaping status: Never Used  Substance and Sexual Activity   Alcohol use: Not Currently    Comment: 3 glasses of wine per week, none for past 2-3 mos   Drug use: No    Comment:  quit smoking 03/2010   Sexual activity: Not Currently    Comment: declined condoms  Other Topics Concern   Not on file  Social History Narrative   Not on file   Social Drivers of Health   Financial Resource Strain: Not on file  Food Insecurity: Not on file  Transportation Needs: Not on file  Physical Activity: Not on file  Stress: Not on file  Social Connections: Not on file  Intimate Partner Violence: Not on file   Family History  Problem Relation Age of Onset   Stomach cancer Mother    Colon cancer Mother        EARLY 70'S   Cancer Mother        abdominal tumor   Kidney disease Father 50       kidney disease   Heart disease Father    Esophageal cancer Brother    Diabetes Brother    Cancer Maternal Aunt        breast   Colon polyps Neg Hx    Crohn's disease Neg Hx    Rectal cancer Neg Hx    Ulcerative colitis Neg Hx     Vitals  BP (!) 165/93   Pulse (!) 57   Temp 97.9 F (36.6 C) (Oral)   Ht 6\' 2"  (1.88 m)   Wt 226 lb (102.5 kg)   SpO2 93%   BMI 29.02 kg/m    Examination  Gen: no acute distress HEENT: [place]/AT, no scleral icterus, no pale conjunctivae, hearing normal, oral mucosa moist Neck: Supple Cardio: Regular rate and rhythm, s1s2 Resp: Pulmonary effort normal in room air, normal breath sounds  GI: nondistended, non tender and soft GU: Musc: Extremities: No pedal edema Skin: No rashes Neuro: grossly non focal , awake, alert and oriented * 3  Psych: Calm, cooperative  Lab Results  HIV 1 RNA Quant (Copies/mL)  Date Value  11/17/2021 37 (H)  10/27/2020 61 (H)  10/29/2019 92 (H)   CD4 T Cell Abs (/uL)  Date Value  11/17/2021 203 (L)  10/27/2020 171 (L)  10/29/2019 198 (L)   No results found for: "HIV1GENOSEQ" Lab Results  Component Value Date   WBC 3.9 (L) 04/05/2022   HGB 13.0 04/05/2022   HCT 37.2 (L) 04/05/2022   MCV 108.8 (H) 04/05/2022   PLT 224.0 04/05/2022    Lab Results  Component Value Date   CREATININE 0.95 04/05/2022    BUN 15 04/05/2022   NA 139 04/05/2022   K 4.7 04/05/2022   CL 106 04/05/2022   CO2 26 04/05/2022   Lab Results  Component Value Date   ALT 26 04/05/2022   AST 22 04/05/2022   ALKPHOS 60 04/05/2022   BILITOT 0.5 04/05/2022    Lab Results  Component Value Date   CHOL 216 (H) 11/17/2021   TRIG 81 11/17/2021   HDL 76 11/17/2021   LDLCALC 122 (H) 11/17/2021   No results found for: "HAV" Lab Results  Component Value Date   HEPBSAG No 03/28/2006   HEPBSAB Yes 03/28/2006   Lab Results  Component Value Date   HCVAB No 03/28/2006   Lab Results  Component Value Date   CHLAMYDIAWP Negative 11/17/2021   N Negative 11/17/2021   No results found for: "GCPROBEAPT" No results found for: "QUANTGOLD"   Health Maintenance: Immunization History  Administered Date(s) Administered   Influenza Split 11/02/2010, 11/22/2011   Influenza Whole 10/26/2005, 10/25/2007, 03/28/2009, 10/21/2009   Influenza,inj,Quad PF,6+ Mos 10/26/2012, 10/23/2013, 11/14/2019, 11/09/2020   Influenza-Unspecified 11/16/2014, 11/01/2021   PFIZER(Purple Top)SARS-COV-2 Vaccination 04/15/2019, 05/05/2019, 12/09/2019   Pfizer Covid-19 Vaccine Bivalent Booster 70yrs & up 08/22/2020   Pfizer(Comirnaty)Fall Seasonal Vaccine 12 years and older 12/01/2021   Pneumococcal Polysaccharide-23 10/26/2005, 07/07/2010   Unspecified SARS-COV-2 Vaccination 01/11/2021   Zoster Recombinant(Shingrix) 08/25/2021, 11/28/2021   Assessment/Plan: # HIV/AIDs  - continue prezista , genvoya  and dapsone  as is  - labs today  - fu in 6 months pending labs today    # Dyslipidemia - Follows with PCP and on life style modifications    # STD Screening  - low risk, deferred   # Immunization  - PCV 20 today   #Health maintenance Lipid panel today, may need statin  Anal Pap: had h/o anal ca previously and fu per PCP for surviellance   Dental Care discussed  Colonoscopy - getting cologuard per PCP  Patient's labs were reviewed as well  as his previous records. Patients questions were addressed and answered. Safe sex counseling done.   I spent 36 minutes involved in face-to-face and non-face-to-face activities for this patient on the day of the visit. Professional time spent includes the following activities: Preparing to see the patient (review of tests), Obtaining and reviewing separately obtained history (prior notes from Dr Daina Drum and last PCP notes), Performing a medically appropriate examination and evaluation , Ordering medications/labs, Documenting clinical information in the EMR, Independently interpreting results (not separately reported), Communicating results to the patient, Counseling and educating the patient and Care coordination (not separately reported).   Of note, portions of this note may have been created with voice recognition software. While this note has been edited for accuracy, occasional wrong-word or 'sound-a-like' substitutions may have occurred due to the inherent limitations of voice recognition software.   Electronically signed by:  Terre Ferri, MD Infectious Disease Physician [facility] [address] [place], [place] [address] Phone: [phone]  Fax: [phone]

## 2023-07-05 LAB — T-HELPER CELLS (CD4) COUNT (NOT AT ARMC)
CD4 % Helper T Cell: 14 % — ABNORMAL LOW (ref 33–65)
CD4 T Cell Abs: 192 /uL — ABNORMAL LOW (ref 400–1790)

## 2023-07-07 DIAGNOSIS — Z0000 Encounter for general adult medical examination without abnormal findings: Secondary | ICD-10-CM | POA: Insufficient documentation

## 2023-07-07 LAB — CBC
HCT: 33.7 % — ABNORMAL LOW (ref 38.5–50.0)
Hemoglobin: 11.5 g/dL — ABNORMAL LOW (ref 13.2–17.1)
MCH: 37.7 pg — ABNORMAL HIGH (ref 27.0–33.0)
MCHC: 34.1 g/dL (ref 32.0–36.0)
MCV: 110.5 fL — ABNORMAL HIGH (ref 80.0–100.0)
MPV: 9.3 fL (ref 7.5–12.5)
Platelets: 172 10*3/uL (ref 140–400)
RBC: 3.05 10*6/uL — ABNORMAL LOW (ref 4.20–5.80)
RDW: 12.2 % (ref 11.0–15.0)
WBC: 3.7 10*3/uL — ABNORMAL LOW (ref 3.8–10.8)

## 2023-07-07 LAB — COMPREHENSIVE METABOLIC PANEL WITH GFR
AG Ratio: 2.2 (calc) (ref 1.0–2.5)
ALT: 22 U/L (ref 9–46)
AST: 20 U/L (ref 10–35)
Albumin: 4.4 g/dL (ref 3.6–5.1)
Alkaline phosphatase (APISO): 56 U/L (ref 35–144)
BUN: 17 mg/dL (ref 7–25)
CO2: 25 mmol/L (ref 20–32)
Calcium: 8.6 mg/dL (ref 8.6–10.3)
Chloride: 105 mmol/L (ref 98–110)
Creat: 0.95 mg/dL (ref 0.70–1.35)
Globulin: 2 g/dL (ref 1.9–3.7)
Glucose, Bld: 98 mg/dL (ref 65–99)
Potassium: 4.5 mmol/L (ref 3.5–5.3)
Sodium: 138 mmol/L (ref 135–146)
Total Bilirubin: 0.7 mg/dL (ref 0.2–1.2)
Total Protein: 6.4 g/dL (ref 6.1–8.1)
eGFR: 90 mL/min/{1.73_m2} (ref >=60)

## 2023-07-07 LAB — LIPID PANEL
Cholesterol: 216 mg/dL — ABNORMAL HIGH (ref <200)
HDL: 69 mg/dL (ref >=40)
LDL Cholesterol (Calc): 130 mg/dL — ABNORMAL HIGH
Non-HDL Cholesterol (Calc): 147 mg/dL — ABNORMAL HIGH (ref <130)
Total CHOL/HDL Ratio: 3.1 (calc) (ref <5.0)
Triglycerides: 76 mg/dL (ref <150)

## 2023-07-07 LAB — HIV RNA, RTPCR W/R GT (RTI, PI,INT)
HIV 1 RNA Quant: 104 {copies}/mL — ABNORMAL HIGH
HIV-1 RNA Quant, Log: 2.02 {Log_copies}/mL — ABNORMAL HIGH

## 2023-07-13 NOTE — Progress Notes (Signed)
 canceled

## 2023-07-14 ENCOUNTER — Ambulatory Visit: Payer: Self-pay | Admitting: Infectious Diseases

## 2023-07-14 ENCOUNTER — Other Ambulatory Visit: Payer: Self-pay

## 2023-07-14 DIAGNOSIS — B20 Human immunodeficiency virus [HIV] disease: Secondary | ICD-10-CM

## 2023-07-14 MED ORDER — DARUNAVIR 800 MG PO TABS: 800.0000 mg | ORAL_TABLET | Freq: Every day | ORAL | 11 refills | Status: AC

## 2023-07-14 MED ORDER — DAPSONE 100 MG PO TABS: 100.0000 mg | ORAL_TABLET | Freq: Every morning | ORAL | 11 refills | Status: AC

## 2023-07-14 MED ORDER — GENVOYA 150-150-200-10 MG PO TABS: 1.0000 | ORAL_TABLET | Freq: Every day | ORAL | 11 refills | Status: AC

## 2023-07-14 NOTE — Telephone Encounter (Signed)
 Spoke to the patient and discussed lab results and patient verbalized understanding.  Patient also scheduled for a sooner appointment. Patient requested pharmacy to be changed to [facility]. Rx resent to new pharmacy.  Luther Newhouse Adel Holt, CMA

## 2023-07-14 NOTE — Telephone Encounter (Signed)
-----   Message from Melvina Stage sent at 07/14/2023  6:53 AM EDT ----- Labs noted   WBC mildly low at 3.7, was low a year ago, will monitor   LDL elevated at 130. He has h/o dyslipidemia and follows PCP and recommend to fu with his PCP for management including need for medicine.   VL at 104, still less than 200. Encourage to be compliant with ART. I would like to see him little sooner 3 months instead of 6  CD4 is less than 200 and continue dapsone  ----- Message ----- From: Interface, [facility] Lab Results In Sent: 07/04/2023  10:45 PM EDT To: Terre Ferri, MD

## 2023-10-20 ENCOUNTER — Ambulatory Visit: Admitting: Infectious Diseases

## 2023-10-20 ENCOUNTER — Other Ambulatory Visit: Payer: Self-pay

## 2023-10-20 VITALS — BP 129/76 | HR 57 | Temp 98.2°F | Wt 219.0 lb

## 2023-10-20 DIAGNOSIS — E785 Hyperlipidemia, unspecified: Secondary | ICD-10-CM | POA: Diagnosis not present

## 2023-10-20 DIAGNOSIS — Z113 Encounter for screening for infections with a predominantly sexual mode of transmission: Secondary | ICD-10-CM

## 2023-10-20 DIAGNOSIS — Z23 Encounter for immunization: Secondary | ICD-10-CM | POA: Diagnosis not present

## 2023-10-20 DIAGNOSIS — B20 Human immunodeficiency virus [HIV] disease: Secondary | ICD-10-CM

## 2023-10-20 DIAGNOSIS — Z7185 Encounter for immunization safety counseling: Secondary | ICD-10-CM | POA: Insufficient documentation

## 2023-10-20 DIAGNOSIS — Z79899 Other long term (current) drug therapy: Secondary | ICD-10-CM | POA: Diagnosis not present

## 2023-10-20 DIAGNOSIS — Z0000 Encounter for general adult medical examination without abnormal findings: Secondary | ICD-10-CM

## 2023-10-20 MED ORDER — ATORVASTATIN CALCIUM 10 MG PO TABS: 10.0000 mg | ORAL_TABLET | Freq: Every day | ORAL | 5 refills | Status: DC

## 2023-10-20 NOTE — Progress Notes (Unsigned)
 [address], [place], [place], [address]                                                                  Phn. [phone]; Fax: [phone]                                                                             Date: 10/20/23  Reason for Visit: Routine HIV care.  HPI: Matthew Cortez is a 63 y.o.old male MSM, 63 anal ca s/ p treatment, DDD, HIV well controlled on Prezista /Genvoya  and Dapsone , Dyslipidemia who is here for regular fu. Patient previously followed by Dr Elaine.  Reports compliance with Prezista , Genvoya , and Dapsone , taking consistently every morning with food. He experiences no issues with adherence or obtaining medications. Reports he is due to submit a stool sample for colon ca screening and has a new PCP that he follows, last seen 04/05/22. Reports following with dentist as well. Reports has received shingles vaccine and willing to get PCV 2o vaccine.   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 He is not sexually active with anyone other than his husband. He does not smoke or use recreational drugs and consumes wine occasionally. He has two small dogs.   No complaints today   9/18 Compliance with ART and dapsone . Willing to start on atorvastatin . Reports being seen by Dr Sheldon for anal ca in the past. Willing to get flu vaccine. Denies smoking, alcohol and recreational drugs and being sexually active. No complaints.   ROS: As stated in above HPI; all other systems were reviewed and are otherwise negative unless noted below  No reported fever / chills, night sweats, unintentional weight loss, acute visual change, odynophagia, chest pain/pressure, new or worsened SOB or WOB, nausea, vomiting, diarrhea, dysuria, GU discharge, syncope, seizures, red/hot  swollen joints, hallucinations / delusions, rashes, new allergies, unusual / excessive bleeding, swollen lymph nodes, or new hospitalizations/ED visits/Urgent Care visits since the pt was last seen.  PMH/ PSH/ FamHx / Social Hx , medications and allergies reviewed and updated as appropriate; please see corresponding tab in EHR / prior notes                                        Current Outpatient Medications on File Prior to Visit  Medication Sig Dispense Refill   dapsone  100 MG tablet Take 1 tablet (100 mg total) by mouth every morning. 30 tablet 11   darunavir  (PREZISTA ) 800 MG tablet Take 1 tablet (800  mg total) by mouth daily with breakfast. 30 tablet 11   elvitegravir-cobicistat -emtricitabine -tenofovir  (GENVOYA ) 150-150-200-10 MG TABS tablet Take 1 tablet by mouth daily with breakfast. 30 tablet 11   No current facility-administered medications on file prior to visit.  '  Allergies  Allergen Reactions   Doxycycline Hives    Burned inside my body, lips split open and bled   Sulfonamide Derivatives Hives    burned inside my body, inside my mouth   Amoxicillin     REACTION: rash   Nevirapine     REACTION: Stevens-Johnson syndrome   Penicillins    Past Medical History:  Diagnosis Date   Allergy    Anal condyloma    Cancer (HCC) 02/11/2011   squamous cell carcinoma of anus   Cholelithiasis    DDD (degenerative disc disease)    DDD (degenerative disc disease)    History of radiation therapy 03/29/11- 05/07/11   EOT4/06/2011 anan ca, 52.2Gy   HIV infection (HCC)    Pneumonia 15 yrs ago   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   Past Surgical History:  Procedure Laterality Date   COLONOSCOPY  !/14/2014   [facility]   RECTAL BIOPSY  10/22/2011   Procedure: BIOPSY RECTAL;  Surgeon: Elspeth KYM Schultze, MD;  Location: [facility];  Service: General;  Laterality: N/A;   Social History   Socioeconomic History   Marital status: Divorced    Spouse name: Not on file   Number of  children: Not on file   Years of education: Not on file   Highest education level: Not on file  Occupational History   Not on file  Tobacco Use   Smoking status: Former    Current packs/day: 0.00    Average packs/day: 0.3 packs/day for 20.0 years (5.0 ttl pk-yrs)    Types: Cigarettes    Start date: 03/07/1990    Quit date: 03/07/2010    Years since quitting: 13.6    Passive exposure: Never   Smokeless tobacco: Never  Vaping Use   Vaping status: Never Used  Substance and Sexual Activity   Alcohol use: Not Currently    Comment: 3 glasses of wine per week, none for past 2-3 mos   Drug use: No    Comment: quit smoking 03/2010   Sexual activity: Not Currently    Comment: declined condoms  Other Topics Concern   Not on file  Social History Narrative   Not on file   Social Drivers of Health   Financial Resource Strain: Not on file  Food Insecurity: Not on file  Transportation Needs: Not on file  Physical Activity: Not on file  Stress: Not on file  Social Connections: Not on file  Intimate Partner Violence: Not on file   Family History  Problem Relation Age of Onset   Stomach cancer Mother    Colon cancer Mother        EARLY 70'S   Cancer Mother        abdominal tumor   Kidney disease Father 69       kidney disease   Heart disease Father    Esophageal cancer Brother    Diabetes Brother    Cancer Maternal Aunt        breast   Colon polyps Neg Hx    Crohn's disease Neg Hx    Rectal cancer Neg Hx    Ulcerative colitis Neg Hx     Vitals  BP 129/76   Pulse (!) 57  Temp 98.2 F (36.8 C) (Oral)   Wt 219 lb (99.3 kg)   SpO2 92%   BMI 28.12 kg/m   Examination  Gen: no acute distress HEENT: [employer]/AT, no scleral icterus, no pale conjunctivae, hearing normal, oral mucosa moist Neck: Supple Cardio: Regular rate and rhythm Resp: Pulmonary effort normal in room air GI: nondistended GU: Musc: Extremities: No pedal edema Skin: No rashes Neuro: grossly non focal ,  awake, alert and oriented * 3  Psych: Calm, cooperative  Lab Results HIV 1 RNA Quant  Date Value  07/04/2023 104 copies/mL (H)  11/17/2021 37 Copies/mL (H)  10/27/2020 61 Copies/mL (H)   CD4 T Cell Abs (/uL)  Date Value  07/04/2023 192 (L)  11/17/2021 203 (L)  10/27/2020 171 (L)   No results found for: HIV1GENOSEQ Lab Results  Component Value Date   WBC 3.7 (L) 07/04/2023   HGB 11.5 (L) 07/04/2023   HCT 33.7 (L) 07/04/2023   MCV 110.5 (H) 07/04/2023   PLT 172 07/04/2023    Lab Results  Component Value Date   CREATININE 0.95 07/04/2023   BUN 17 07/04/2023   NA 138 07/04/2023   K 4.5 07/04/2023   CL 105 07/04/2023   CO2 25 07/04/2023   Lab Results  Component Value Date   ALT 22 07/04/2023   AST 20 07/04/2023   ALKPHOS 60 04/05/2022   BILITOT 0.7 07/04/2023    Lab Results  Component Value Date   CHOL 216 (H) 07/04/2023   TRIG 76 07/04/2023   HDL 69 07/04/2023   LDLCALC 130 (H) 07/04/2023   No results found for: HAV Lab Results  Component Value Date   HEPBSAG No 03/28/2006   HEPBSAB Yes 03/28/2006   Lab Results  Component Value Date   HCVAB No 03/28/2006   Lab Results  Component Value Date   CHLAMYDIAWP Negative 11/17/2021   N Negative 11/17/2021   No results found for: GCPROBEAPT No results found for: QUANTGOLD   Health Maintenance: Immunization History  Administered Date(s) Administered   Influenza Split 11/02/2010, 11/22/2011   Influenza Whole 10/26/2005, 10/25/2007, 03/28/2009, 10/21/2009   Influenza,inj,Quad PF,6+ Mos 10/26/2012, 10/23/2013, 11/14/2019, 11/09/2020   Influenza-Unspecified 11/16/2014, 11/01/2021   PFIZER(Purple Top)SARS-COV-2 Vaccination 04/15/2019, 05/05/2019, 12/09/2019   PNEUMOCOCCAL CONJUGATE-20 07/04/2023   Pfizer Covid-19 Vaccine Bivalent Booster 21yrs & up 08/22/2020   Pfizer(Comirnaty)Fall Seasonal Vaccine 12 years and older 12/01/2021   Pneumococcal Polysaccharide-23 10/26/2005, 07/07/2010   Unspecified  SARS-COV-2 Vaccination 01/11/2021   Zoster Recombinant(Shingrix) 08/25/2021, 11/28/2021   Assessment/Plan: # HIV/AIDs  - continue prezista , genvoya  and dapsone  as is  - 6/2 labs reviewed and discussed - labs today  - fu in 4 months pending labs   # Dyslipidemia - will start atorvastatin  10 mg po daily. DDIs reviewed and will monitor while on genvoya  - discussed about regular exercise, healthy diet including more fruits/vegetables  # STD Screening  - recently screened  # Immunization  - Flu vaccine today   # Health maintenance Anal Pap: had h/o anal ca previously and fu with Dr Sheldon if needed   Dental Care discussed  Colonoscopy - reminded cologuard for screening   Patient's labs were reviewed as well as his previous records. Patients questions were addressed and answered. Safe sex counseling done.  I spent 30 minutes involved in face-to-face and non-face-to-face activities for this patient on the day of the visit. Professional time spent includes the following activities: Preparing to see the patient (review of tests), Obtaining and reviewing separately obtained history,  Performing a medically appropriate examination and evaluation, Ordering labs and vaccine, Documenting clinical information in the EMR, Independently interpreting results (not separately reported), Communicating results to the patient, Counseling and educating the patient and Care coordination (not separately reported).   Of note, portions of this note may have been created with voice recognition software. While this note has been edited for accuracy, occasional wrong-word or 'sound-a-like' substitutions may have occurred due to the inherent limitations of voice recognition software.   Electronically signed by:  Annalee Orem, MD Infectious Disease Physician [facility] [address] [place], [place] [address] Phone: [phone]  Fax: [phone]

## 2023-10-21 DIAGNOSIS — Z23 Encounter for immunization: Secondary | ICD-10-CM | POA: Insufficient documentation

## 2023-10-21 LAB — T-HELPER CELLS (CD4) COUNT (NOT AT ARMC)
CD4 % Helper T Cell: 14 % — ABNORMAL LOW (ref 33–65)
CD4 T Cell Abs: 218 /uL — ABNORMAL LOW (ref 400–1790)

## 2023-10-24 LAB — HIV RNA, RTPCR W/R GT (RTI, PI,INT)
HIV 1 RNA Quant: 22 {copies}/mL — ABNORMAL HIGH
HIV-1 RNA Quant, Log: 1.34 {Log_copies}/mL — ABNORMAL HIGH

## 2023-10-26 ENCOUNTER — Ambulatory Visit: Payer: Self-pay | Admitting: Infectious Diseases

## 2024-01-19 ENCOUNTER — Ambulatory Visit: Admitting: Infectious Diseases

## 2024-02-13 NOTE — Progress Notes (Signed)
 The 10-year ASCVD risk score (Arnett DK, et al., 2019) is: 8.8%   Values used to calculate the score:     Age: 64 years     Clinically relevant sex: Male     Is Non-Hispanic African American: No     Diabetic: No     Tobacco smoker: No     Systolic Blood Pressure: 129 mmHg     Is BP treated: No     HDL Cholesterol: 69 mg/dL     Total Cholesterol: 216 mg/dL  Currently prescribed atorvastatin  10 mg.  Shawneen Deetz, BSN, RN

## 2024-02-16 ENCOUNTER — Ambulatory Visit: Payer: Self-pay

## 2024-02-16 ENCOUNTER — Ambulatory Visit: Admitting: Infectious Diseases

## 2024-02-16 ENCOUNTER — Ambulatory Visit: Admitting: Family

## 2024-02-16 ENCOUNTER — Other Ambulatory Visit: Payer: Self-pay

## 2024-02-16 ENCOUNTER — Encounter: Payer: Self-pay | Admitting: Family

## 2024-02-16 ENCOUNTER — Encounter: Payer: Self-pay | Admitting: Infectious Diseases

## 2024-02-16 VITALS — BP 164/92 | HR 58 | Temp 97.7°F | Ht 74.0 in | Wt 224.0 lb

## 2024-02-16 VITALS — BP 122/78 | HR 57 | Temp 97.2°F | Ht 74.0 in | Wt 223.2 lb

## 2024-02-16 DIAGNOSIS — R030 Elevated blood-pressure reading, without diagnosis of hypertension: Secondary | ICD-10-CM | POA: Insufficient documentation

## 2024-02-16 DIAGNOSIS — Z113 Encounter for screening for infections with a predominantly sexual mode of transmission: Secondary | ICD-10-CM

## 2024-02-16 DIAGNOSIS — B20 Human immunodeficiency virus [HIV] disease: Secondary | ICD-10-CM

## 2024-02-16 DIAGNOSIS — R200 Anesthesia of skin: Secondary | ICD-10-CM | POA: Insufficient documentation

## 2024-02-16 DIAGNOSIS — Z79899 Other long term (current) drug therapy: Secondary | ICD-10-CM | POA: Diagnosis not present

## 2024-02-16 DIAGNOSIS — Z0000 Encounter for general adult medical examination without abnormal findings: Secondary | ICD-10-CM

## 2024-02-16 DIAGNOSIS — E785 Hyperlipidemia, unspecified: Secondary | ICD-10-CM | POA: Diagnosis not present

## 2024-02-16 MED ORDER — ATORVASTATIN CALCIUM 10 MG PO TABS: 10.0000 mg | ORAL_TABLET | Freq: Every day | ORAL | 5 refills | Status: AC

## 2024-02-16 NOTE — Progress Notes (Signed)
 "  Patient ID: Matthew Cortez, male    DOB: May 18, 1960, 64 y.o.   MRN: [id number]  Chief Complaint  Patient presents with   Hypertension    Pt c/o High BP reading at OV on today.    Numbness    Pt c/o numbness and tingling in bilateral legs, started in left leg/foot. Present for a few years and more often.   Discussed the use of AI scribe software for clinical note transcription with the patient, who gave verbal consent to proceed.  History of Present Illness Matthew Cortez is a 64 year old male who presents with elevated blood pressure readings and numbness in the feet.  He had elevated blood pressure first noted six months ago at an infectious disease clinic visit, with a peak systolic reading of 205 mmHg that decreased to about 160 mmHg at the same appointment. He has no associated headaches, dizziness, or significant anxiety and denies caffeine or stimulant use. He occasionally feels flushed without other symptoms. He has constant numbness and tingling in both feet, sometimes extending up the legs. Symptoms began in the left leg and now are worse in the right, involving the whole foot, especially around the ankles. Over the past six months he notes worsening balance and avoids stairs without a railing due to fear of falling. He reports a drop-foot sensation with minor falls and head bumps but no serious injury. He has occasional lower back pain but feels the numbness is more related to his feet. 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 He walks his dogs daily and notes his left foot sometimes drags, causing him to trip on curbs.  Assessment & Plan Elevated blood pressure Intermittent elevated readings with most recent spike early this am, normal currently. Possible white coat syndrome. Per EMR weight up 4 lbs over last few months, not too significant. No symptoms like headache or dizziness. Routine disruption noted. - Monitor  blood pressure at home once daily for one week, vary times, morning & evening. - Send blood pressure readings via MyChart or can call the office. - Maintain a low sodium diet. - Hydrate with 2.5 to 3 liters of caffeine-free beverages daily. - Engage in 20-30 minutes of exercise daily.  Peripheral neuropathy Chronic numbness and tingling in feet, worsening over six months, affecting balance. Possible drop foot noted. Differential includes aging neuropathy, long term HIV, or footwear issues. - Referred to podiatrist for evaluation. - Ensure shoes are not too tight, consider wider shoes. - Monitor for changes in symptoms or balance.  Subjective:    Outpatient Medications Prior to Visit  Medication Sig Dispense Refill   [START ON 03/21/2024] atorvastatin  (LIPITOR) 10 MG tablet Take 1 tablet (10 mg total) by mouth daily. 30 tablet 5   dapsone  100 MG tablet Take 1 tablet (100 mg total) by mouth every morning. 30 tablet 11   darunavir  (PREZISTA ) 800 MG tablet Take 1 tablet (800 mg total) by mouth daily with breakfast. 30 tablet 11   elvitegravir-cobicistat -emtricitabine -tenofovir  (GENVOYA ) 150-150-200-10 MG TABS tablet Take 1 tablet by mouth daily with breakfast. 30 tablet 11   No facility-administered medications prior to visit.   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  Constitutional:      General: He is not in acute distress.    Appearance: Normal appearance.  HENT:     Head: Normocephalic.  Cardiovascular:     Rate and Rhythm: Normal rate and regular rhythm.  Pulmonary:     Effort: Pulmonary effort is normal.     Breath sounds: Normal breath sounds.  Musculoskeletal:        General: Normal range of motion.     Cervical back: Normal range of motion.  Skin:    General: Skin is warm and dry.  Neurological:     Mental Status: He is alert and oriented to person, place, and time.  Psychiatric:        Mood and Affect: Mood normal.    BP 122/78 (BP Location: Left Arm, Patient Position: Sitting, Cuff Size: Large)   Pulse (!) 57   Temp (!) 97.2 F (36.2 C) (Temporal)   Ht 6' 2 (1.88 m)   Wt 223 lb 4 oz (101.3 kg)   SpO2 95%   BMI 28.66 kg/m  Wt Readings from Last 3 Encounters:  02/16/24 223 lb 4 oz (101.3 kg)  02/16/24 224 lb (101.6 kg)  10/20/23 219 lb (99.3 kg)      Birgit Nowling, NP     [1]  Allergies Allergen Reactions   Doxycycline Hives    Burned inside my body, lips split open and bled   Sulfonamide Derivatives Hives    burned inside my body, inside my mouth   Amoxicillin     REACTION: rash   Nevirapine     REACTION: Stevens-Johnson syndrome   Penicillins    "

## 2024-02-16 NOTE — Telephone Encounter (Signed)
 FYI Only or Action Required?: FYI only for provider: appointment scheduled on 02/16/24.  Patient was last seen in primary care on 04/05/2022 by Kennyth Worth HERO, MD.  Called Nurse Triage reporting Hypertension.  Symptoms began several months ago.  Interventions attempted: Nothing.  Symptoms are: stable.  Triage Disposition: See PCP When Office is Open (Within 3 Days)  Patient/caregiver understands and will follow disposition?: Yes  Reason for Disposition  Systolic BP >= 160 OR Diastolic >= 100  Answer Assessment - Initial Assessment Questions Seen at infectious disease today and advised to f/u with PCP. Pt reports last 6 months experiencing numbness in both feet and ankles, trouble with balance. Denies pain or swelling. Mild back pain, sometimes radiates down left leg.   1. BLOOD PRESSURE: What is your blood pressure? Did you take at least two measurements 5 minutes apart?     176/95,  164/92   2. ONSET: When did you take your blood pressure?     Around 9 am at doctors office  3. HOW: How did you take your blood pressure? (e.g., automatic home BP monitor, visiting nurse)     Automatic BP cuff at doctors office   4. HISTORY: Do you have a history of high blood pressure?     Denies  5. MEDICINES: Are you taking any medicines for blood pressure? Have you missed any doses recently?     Denies  6. OTHER SYMPTOMS: Do you have any symptoms? (e.g., blurred vision, chest pain, difficulty breathing, headache, weakness)     Denies chest pain, SOB, blurred vision or headaches.  Protocols used: Blood Pressure - High-A-AH  Copied from CRM #[id number]. Topic: Clinical - Red Word Triage >> Feb 16, 2024 11:33 AM Winona SAUNDERS wrote: Santina to infection Disease dr who requested pt schedule an appointment with pcp for BP 205/80 1st reading - 164/50 2nd reading. Trouble with balance, numbness in feet and ankle which has became worst in the last 6 months

## 2024-02-16 NOTE — Progress Notes (Addendum)
 "                                                                                                                                                                                                      [address], [place], [place], [address]                                                                  Phn. [phone]; Fax: [phone]                                                                             Date: 02/16/2024  Reason for Visit: Routine HIV care.  HPI: Matthew Cortez is a 64 y.o.old male MSM, anal ca s/ p treatment, DDD, HIV well controlled on Prezista /Genvoya  and Dapsone , Dyslipidemia who is here for regular fu. Patient previously followed by Dr Elaine.  Reports compliance with Prezista , Genvoya , and Dapsone , taking consistently every morning with food. He experiences no issues with adherence or obtaining medications. Reports he is due to submit a stool sample for colon ca screening and has a new PCP that he follows, last seen 04/05/22. Reports following with dentist as well. Reports has received shingles vaccine and willing to get PCV 2o vaccine.   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 He is not sexually active with anyone other than his husband. He does not smoke or use recreational drugs and consumes wine occasionally. He has two small dogs.   No complaints today   1/15 Compliant with his Prezista , Genvoya , Dapsone  with no missed doses or concerns.  He has been having worsening numbness in his bilateral legs including feet in the 6 months, he feels some off balance while walking his dog and feels like he needs to drag his left foot. Also has tingling and feels left leg is some what weaker than right. Deferred STD screening as sexually active with husband only.  He thinks he has gotten RSV vaccine.  He has a follow-up with his PCP once  his insurance is started to discuss elevated blood pressure, colonoscopy as well as numbness.   ROS: As  stated in above HPI; all other systems were reviewed and are otherwise negative unless noted below  No reported fever / chills, night sweats, unintentional weight loss, acute visual change, odynophagia, chest pain/pressure, new or worsened SOB or WOB, nausea, vomiting, diarrhea, dysuria, GU discharge, syncope, seizures, red/hot swollen joints, hallucinations / delusions, rashes, new allergies, unusual / excessive bleeding, swollen lymph nodes, or new hospitalizations/ED visits/Urgent Care visits since the pt was last seen.  PMH/ PSH/ FamHx / Social Hx , medications and allergies reviewed and updated as appropriate; please see corresponding tab in EHR / prior notes                                        Current Outpatient Medications on File Prior to Visit  Medication Sig Dispense Refill   atorvastatin  (LIPITOR) 10 MG tablet Take 1 tablet (10 mg total) by mouth daily. 30 tablet 5   dapsone  100 MG tablet Take 1 tablet (100 mg total) by mouth every morning. 30 tablet 11   darunavir  (PREZISTA ) 800 MG tablet Take 1 tablet (800 mg total) by mouth daily with breakfast. 30 tablet 11   elvitegravir-cobicistat -emtricitabine -tenofovir  (GENVOYA ) 150-150-200-10 MG TABS tablet Take 1 tablet by mouth daily with breakfast. 30 tablet 11   No current facility-administered medications on file prior to visit.  '  Allergies  Allergen Reactions   Doxycycline Hives    Burned inside my body, lips split open and bled   Sulfonamide Derivatives Hives    burned inside my body, inside my mouth   Amoxicillin     REACTION: rash   Nevirapine     REACTION: Stevens-Johnson syndrome   Penicillins    Past Medical History:  Diagnosis Date   Allergy    Anal condyloma    Cancer (HCC) 02/11/2011   squamous cell carcinoma of anus   Cholelithiasis    DDD (degenerative disc disease)    DDD (degenerative disc disease)    History of radiation therapy 03/29/11- 05/07/11   EOT4/06/2011 anan ca, 52.2Gy   HIV infection (HCC)     Pneumonia 15 yrs ago   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   Past Surgical History:  Procedure Laterality Date   COLONOSCOPY  !/14/2014   [facility]   RECTAL BIOPSY  10/22/2011   Procedure: BIOPSY RECTAL;  Surgeon: Elspeth KYM Schultze, MD;  Location: [facility];  Service: General;  Laterality: N/A;   Social History   Socioeconomic History   Marital status: Divorced    Spouse name: Not on file   Number of children: Not on file   Years of education: Not on file   Highest education level: Not on file  Occupational History   Not on file  Tobacco Use   Smoking status: Former    Current packs/day: 0.00    Average packs/day: 0.3 packs/day for 20.0 years (5.0 ttl pk-yrs)    Types: Cigarettes    Start date: 03/07/1990    Quit date: 03/07/2010    Years since quitting: 13.9    Passive exposure: Never   Smokeless tobacco: Never  Vaping Use   Vaping status: Never Used  Substance and Sexual Activity   Alcohol use: Not Currently    Comment: 3 glasses of wine per week, none for past 2-3 mos  Drug use: No    Comment: quit smoking 03/2010   Sexual activity: Not Currently    Comment: declined condoms  Other Topics Concern   Not on file  Social History Narrative   Not on file   Social Drivers of Health   Tobacco Use: Medium Risk (02/16/2024)   Patient History    Smoking Tobacco Use: Former    Smokeless Tobacco Use: Never    Passive Exposure: Never  Physicist, Medical Strain: Not on file  Food Insecurity: Not on file  Transportation Needs: Not on file  Physical Activity: Not on file  Stress: Not on file  Social Connections: Not on file  Intimate Partner Violence: Not on file  Depression (PHQ2-9): Low Risk (10/20/2023)   Depression (PHQ2-9)    PHQ-2 Score: 0  Alcohol Screen: Not on file  Housing: Not on file  Utilities: Not on file  Health Literacy: Not on file   Family History  Problem Relation Age of Onset   Stomach cancer Mother    Colon cancer Mother        EARLY  26'S   Cancer Mother        abdominal tumor   Kidney disease Father 64       kidney disease   Heart disease Father    Esophageal cancer Brother    Diabetes Brother    Cancer Maternal Aunt        breast   Colon polyps Neg Hx    Crohn's disease Neg Hx    Rectal cancer Neg Hx    Ulcerative colitis Neg Hx     Vitals  BP (!) 176/95 Comment: left arm  Pulse (!) 58   Temp 97.7 F (36.5 C) (Oral)   Ht 6' 2 (1.88 m)   Wt 224 lb (101.6 kg)   SpO2 98%   BMI 28.76 kg/m   Examination  Gen: no acute distress HEENT: [place]/AT, no scleral icterus, no pale conjunctivae, hearing normal, oral mucosa moist Neck: Supple Cardio:Normal HR Resp: Pulmonary effort normal in room air GI: nondistended GU: Musc: Extremities: No pedal edema Skin: No rashes Neuro: grossly non focal , awake, alert, ambulatory, power 5/5 in all extremities  Psych: Calm, cooperative  Lab Results HIV 1 RNA Quant  Date Value  10/20/2023 22 copies/mL (H)  07/04/2023 104 copies/mL (H)  11/17/2021 37 Copies/mL (H)   CD4 T Cell Abs (/uL)  Date Value  10/20/2023 218 (L)  07/04/2023 192 (L)  11/17/2021 203 (L)   No results found for: HIV1GENOSEQ Lab Results  Component Value Date   WBC 3.7 (L) 07/04/2023   HGB 11.5 (L) 07/04/2023   HCT 33.7 (L) 07/04/2023   MCV 110.5 (H) 07/04/2023   PLT 172 07/04/2023    Lab Results  Component Value Date   CREATININE 0.95 07/04/2023   BUN 17 07/04/2023   NA 138 07/04/2023   K 4.5 07/04/2023   CL 105 07/04/2023   CO2 25 07/04/2023   Lab Results  Component Value Date   ALT 22 07/04/2023   AST 20 07/04/2023   ALKPHOS 60 04/05/2022   BILITOT 0.7 07/04/2023    Lab Results  Component Value Date   CHOL 216 (H) 07/04/2023   TRIG 76 07/04/2023   HDL 69 07/04/2023   LDLCALC 130 (H) 07/04/2023   No results found for: HAV Lab Results  Component Value Date   HEPBSAG No 03/28/2006   HEPBSAB Yes 03/28/2006   Lab Results  Component Value Date  HCVAB No  03/28/2006   Lab Results  Component Value Date   CHLAMYDIAWP Negative 11/17/2021   N Negative 11/17/2021   No results found for: GCPROBEAPT No results found for: QUANTGOLD   Health Maintenance: Immunization History  Administered Date(s) Administered   Influenza Split 11/02/2010, 11/22/2011   Influenza Whole 10/26/2005, 10/25/2007, 03/28/2009, 10/21/2009   Influenza, Seasonal, Injecte, Preservative Fre 10/20/2023   Influenza,inj,Quad PF,6+ Mos 10/26/2012, 10/23/2013, 11/14/2019, 11/09/2020   Influenza-Unspecified 11/16/2014, 11/01/2021   PFIZER(Purple Top)SARS-COV-2 Vaccination 04/15/2019, 05/05/2019, 12/09/2019   PNEUMOCOCCAL CONJUGATE-20 07/04/2023   Pfizer Covid-19 Vaccine Bivalent Booster 53yrs & up 08/22/2020   Pfizer(Comirnaty)Fall Seasonal Vaccine 12 years and older 12/01/2021   Pneumococcal Polysaccharide-23 10/26/2005, 07/07/2010   Unspecified SARS-COV-2 Vaccination 01/11/2021   Zoster Recombinant(Shingrix) 08/25/2021, 11/28/2021   Assessment/Plan: # HIV/AIDs  - adherence assessed, side effects reviewed/discussed and DDIs reviewed  - continue prezista , genvoya  and dapsone  as is  - labs today.  Discussed plan to stop dapsone  if viral load suppressed - fu in 5 months pending labs   # Numbness in b/l LE - could be neuropathy, has h/o lumbar radiculopathy  - he is going to fu with PCP  # Elevated BP - BP checked multiple times in the ED with last read still elevated  - No symptoms or signs of hypertensive emergency, urgency  - Recheck at home - To fu with PCP if need to be started on meds  # Dyslipidemia - continue atorvastatin  10 mg po daily.  Monitor while on Genvoya  - fu with PCP   # STD Screening  - RPR but deferred other screening  # Health maintenance Check Hepatitis A total ab Anal Pap: had h/o anal ca previously and fu with Dr Sheldon if needed   Dental Care has been discussed  Colonoscopy - reminded today and will discuss with PCP   I personally  spent a total of 31 minutes in the care of the patient today including preparing to see the patient, getting/reviewing separately obtained history, counseling and educating, placing orders, documenting clinical information in the EHR, independently interpreting results, and communicating results.  Electronically signed by:  Annalee Orem, MD Infectious Disease Physician [facility] [address] [place], [place] [address] Phone: [phone]  Fax: [phone]  "

## 2024-02-17 LAB — T-HELPER CELLS (CD4) COUNT (NOT AT ARMC)
CD4 % Helper T Cell: 14 % — ABNORMAL LOW (ref 33–65)
CD4 T Cell Abs: 187 /uL — ABNORMAL LOW (ref 400–1790)

## 2024-02-19 LAB — COMPREHENSIVE METABOLIC PANEL WITH GFR
AG Ratio: 2 (calc) (ref 1.0–2.5)
ALT: 21 U/L (ref 9–46)
AST: 19 U/L (ref 10–35)
Albumin: 4.3 g/dL (ref 3.6–5.1)
Alkaline phosphatase (APISO): 64 U/L (ref 35–144)
BUN: 18 mg/dL (ref 7–25)
CO2: 28 mmol/L (ref 20–32)
Calcium: 8.9 mg/dL (ref 8.6–10.3)
Chloride: 106 mmol/L (ref 98–110)
Creat: 0.82 mg/dL (ref 0.70–1.35)
Globulin: 2.2 g/dL (ref 1.9–3.7)
Glucose, Bld: 106 mg/dL — ABNORMAL HIGH (ref 65–99)
Potassium: 4.8 mmol/L (ref 3.5–5.3)
Sodium: 140 mmol/L (ref 135–146)
Total Bilirubin: 0.6 mg/dL (ref 0.2–1.2)
Total Protein: 6.5 g/dL (ref 6.1–8.1)
eGFR: 99 mL/min/1.73m2

## 2024-02-19 LAB — CBC
HCT: 33.3 % — ABNORMAL LOW (ref 39.4–51.1)
Hemoglobin: 11.3 g/dL — ABNORMAL LOW (ref 13.2–17.1)
MCH: 38.3 pg — ABNORMAL HIGH (ref 27.0–33.0)
MCHC: 33.9 g/dL (ref 31.6–35.4)
MCV: 112.9 fL — ABNORMAL HIGH (ref 81.4–101.7)
MPV: 9.8 fL (ref 7.5–12.5)
Platelets: 173 Thousand/uL (ref 140–400)
RBC: 2.95 Million/uL — ABNORMAL LOW (ref 4.20–5.80)
RDW: 11.7 % (ref 11.0–15.0)
WBC: 3.9 Thousand/uL (ref 3.8–10.8)

## 2024-02-19 LAB — HIV RNA, RTPCR W/R GT (RTI, PI,INT)
HIV 1 RNA Quant: 56 {copies}/mL — ABNORMAL HIGH
HIV-1 RNA Quant, Log: 1.75 {Log_copies}/mL — ABNORMAL HIGH

## 2024-02-19 LAB — LIPID PANEL
Cholesterol: 169 mg/dL
HDL: 70 mg/dL
LDL Cholesterol (Calc): 86 mg/dL
Non-HDL Cholesterol (Calc): 99 mg/dL
Total CHOL/HDL Ratio: 2.4 (calc)
Triglycerides: 53 mg/dL

## 2024-02-19 LAB — SYPHILIS: RPR W/REFLEX TO RPR TITER AND TREPONEMAL ANTIBODIES, TRADITIONAL SCREENING AND DIAGNOSIS ALGORITHM: RPR Ser Ql: NONREACTIVE

## 2024-02-19 LAB — HEPATITIS A ANTIBODY, TOTAL: Hepatitis A AB,Total: REACTIVE — AB

## 2024-02-22 ENCOUNTER — Ambulatory Visit: Payer: Self-pay | Admitting: Infectious Diseases

## 2024-06-21 ENCOUNTER — Other Ambulatory Visit: Payer: Self-pay

## 2024-07-05 ENCOUNTER — Ambulatory Visit: Payer: Self-pay | Admitting: Infectious Diseases

## 2024-07-19 ENCOUNTER — Encounter: Admitting: Family Medicine
